# Patient Record
Sex: Female | Born: 1945 | Race: Black or African American | Hispanic: No | Smoking: Former smoker
Health system: Southern US, Community
[De-identification: ages and names within clinical notes are randomized; demographics above are authoritative.]

## PROBLEM LIST (undated history)

## (undated) DIAGNOSIS — R011 Cardiac murmur, unspecified: Secondary | ICD-10-CM

## (undated) DIAGNOSIS — I10 Essential (primary) hypertension: Secondary | ICD-10-CM

## (undated) DIAGNOSIS — M5030 Other cervical disc degeneration, unspecified cervical region: Secondary | ICD-10-CM

## (undated) DIAGNOSIS — T7840XA Allergy, unspecified, initial encounter: Secondary | ICD-10-CM

## (undated) DIAGNOSIS — M779 Enthesopathy, unspecified: Secondary | ICD-10-CM

## (undated) DIAGNOSIS — J302 Other seasonal allergic rhinitis: Secondary | ICD-10-CM

## (undated) DIAGNOSIS — K529 Noninfective gastroenteritis and colitis, unspecified: Secondary | ICD-10-CM

## (undated) DIAGNOSIS — K219 Gastro-esophageal reflux disease without esophagitis: Secondary | ICD-10-CM

## (undated) DIAGNOSIS — E785 Hyperlipidemia, unspecified: Secondary | ICD-10-CM

## (undated) DIAGNOSIS — K5792 Diverticulitis of intestine, part unspecified, without perforation or abscess without bleeding: Secondary | ICD-10-CM

## (undated) HISTORY — PX: TUBAL LIGATION: SHX77

## (undated) HISTORY — DX: Other cervical disc degeneration, unspecified cervical region: M50.30

## (undated) HISTORY — DX: Noninfective gastroenteritis and colitis, unspecified: K52.9

## (undated) HISTORY — DX: Allergy, unspecified, initial encounter: T78.40XA

## (undated) HISTORY — DX: Essential (primary) hypertension: I10

## (undated) HISTORY — DX: Gastro-esophageal reflux disease without esophagitis: K21.9

## (undated) HISTORY — PX: VAGINAL HYSTERECTOMY: SUR661

## (undated) HISTORY — DX: Hyperlipidemia, unspecified: E78.5

---

## 2002-01-21 HISTORY — PX: BREAST BIOPSY: SHX20

## 2003-07-14 ENCOUNTER — Other Ambulatory Visit: Payer: Self-pay

## 2004-04-13 ENCOUNTER — Ambulatory Visit: Payer: Self-pay

## 2004-04-18 ENCOUNTER — Ambulatory Visit: Payer: Self-pay

## 2004-04-20 ENCOUNTER — Ambulatory Visit: Payer: Self-pay | Admitting: Obstetrics and Gynecology

## 2005-09-09 ENCOUNTER — Emergency Department: Payer: Self-pay | Admitting: Emergency Medicine

## 2006-10-06 ENCOUNTER — Emergency Department: Payer: Self-pay | Admitting: Emergency Medicine

## 2007-05-22 ENCOUNTER — Emergency Department: Payer: Self-pay | Admitting: Emergency Medicine

## 2007-11-03 ENCOUNTER — Inpatient Hospital Stay: Payer: Self-pay | Admitting: *Deleted

## 2007-12-28 ENCOUNTER — Ambulatory Visit: Payer: Self-pay | Admitting: Internal Medicine

## 2008-01-06 ENCOUNTER — Ambulatory Visit: Payer: Self-pay | Admitting: Family Medicine

## 2008-01-13 ENCOUNTER — Ambulatory Visit: Payer: Self-pay | Admitting: Internal Medicine

## 2009-02-27 ENCOUNTER — Ambulatory Visit: Payer: Self-pay | Admitting: Internal Medicine

## 2010-03-01 ENCOUNTER — Ambulatory Visit: Payer: Self-pay | Admitting: Internal Medicine

## 2011-04-11 ENCOUNTER — Ambulatory Visit: Payer: Self-pay

## 2012-09-16 ENCOUNTER — Ambulatory Visit: Payer: Self-pay

## 2013-06-28 ENCOUNTER — Ambulatory Visit: Payer: Self-pay | Admitting: Physician Assistant

## 2013-12-03 ENCOUNTER — Ambulatory Visit: Payer: Self-pay | Admitting: Physician Assistant

## 2013-12-05 ENCOUNTER — Emergency Department: Payer: Self-pay | Admitting: Emergency Medicine

## 2013-12-05 LAB — TROPONIN I: Troponin-I: <0.02 ng/mL

## 2013-12-18 ENCOUNTER — Ambulatory Visit: Payer: Self-pay | Admitting: Family Medicine

## 2014-05-03 ENCOUNTER — Ambulatory Visit
Admit: 2014-05-03 | Disposition: A | Discharge status: Home / Self Care | Payer: Self-pay | Attending: Family Medicine | Admitting: Family Medicine

## 2014-07-04 ENCOUNTER — Other Ambulatory Visit: Payer: Self-pay

## 2014-07-04 DIAGNOSIS — Z0000 Encounter for general adult medical examination without abnormal findings: Secondary | ICD-10-CM | POA: Insufficient documentation

## 2014-07-04 DIAGNOSIS — K219 Gastro-esophageal reflux disease without esophagitis: Secondary | ICD-10-CM | POA: Insufficient documentation

## 2014-07-04 DIAGNOSIS — E785 Hyperlipidemia, unspecified: Secondary | ICD-10-CM | POA: Insufficient documentation

## 2014-07-04 DIAGNOSIS — M5410 Radiculopathy, site unspecified: Secondary | ICD-10-CM | POA: Insufficient documentation

## 2014-07-04 DIAGNOSIS — I10 Essential (primary) hypertension: Secondary | ICD-10-CM | POA: Insufficient documentation

## 2014-07-05 ENCOUNTER — Encounter: Payer: Self-pay | Admitting: Family Medicine

## 2014-07-05 ENCOUNTER — Ambulatory Visit (INDEPENDENT_AMBULATORY_CARE_PROVIDER_SITE_OTHER): Payer: BLUE CROSS/BLUE SHIELD | Admitting: Family Medicine

## 2014-07-05 VITALS — BP 130/80 | HR 64 | Ht 61.0 in | Wt 146.0 lb

## 2014-07-05 DIAGNOSIS — K219 Gastro-esophageal reflux disease without esophagitis: Secondary | ICD-10-CM | POA: Diagnosis not present

## 2014-07-05 DIAGNOSIS — I10 Essential (primary) hypertension: Secondary | ICD-10-CM

## 2014-07-05 DIAGNOSIS — E785 Hyperlipidemia, unspecified: Secondary | ICD-10-CM

## 2014-07-05 DIAGNOSIS — Z23 Encounter for immunization: Secondary | ICD-10-CM

## 2014-07-05 MED ORDER — HYDROCHLOROTHIAZIDE 25 MG PO TABS: 25.0000 mg | ORAL_TABLET | Freq: Every day | ORAL | Status: DC

## 2014-07-05 MED ORDER — OMEPRAZOLE 40 MG PO CPDR: 40.0000 mg | DELAYED_RELEASE_CAPSULE | Freq: Every day | ORAL | Status: DC

## 2014-07-05 NOTE — Progress Notes (Signed)
Name: Emily Boyer   MRN: [id number]    DOB: 14-Oct-1945   Date:07/05/2014       Progress Note  Subjective  Chief Complaint  Chief Complaint  Patient presents with  . Hypertension  . Gastrophageal Reflux    Hypertension This is a recurrent problem. The current episode started more than 1 year ago. The problem has been resolved since onset. Associated symptoms include neck pain. Pertinent negatives include no anxiety, blurred vision, chest pain, headaches, malaise/fatigue, orthopnea, palpitations, peripheral edema, PND or shortness of breath. There are no associated agents to hypertension. There are no known risk factors for coronary artery disease. Past treatments include diuretics. The current treatment provides moderate improvement. There are no compliance problems.  There is no history of angina, kidney disease, CAD/MI, CVA, heart failure, left ventricular hypertrophy, PVD or retinopathy. There is no history of chronic renal disease.  Gastrophageal Reflux She reports no abdominal pain, no belching, no chest pain, no coughing, no dysphagia, no heartburn, no nausea, no sore throat or no wheezing. This is a chronic problem. The current episode started more than 1 year ago. The problem occurs rarely. The problem has been unchanged. The symptoms are aggravated by certain foods. Pertinent negatives include no melena or weight loss. She has tried a PPI for the symptoms. The treatment provided moderate relief.  Hyperlipidemia This is a chronic problem. The current episode started more than 1 year ago. The problem is controlled. Recent lipid tests were reviewed and are normal. Exacerbating diseases include obesity. She has no history of chronic renal disease or diabetes. There are no known factors aggravating her hyperlipidemia. Pertinent negatives include no chest pain, focal weakness, myalgias or shortness of breath. Current antihyperlipidemic treatment includes diet change.    No problem-specific  assessment & plan notes found for this encounter.   Past Medical History  Diagnosis Date  . Hypertension   . GERD (gastroesophageal reflux disease)     Past Surgical History  Procedure Laterality Date  . Vaginal hysterectomy    . Tubal ligation    . Breast lumpectomy      History reviewed. No pertinent family history.  History   Social History  . Marital Status: Married    Spouse Name: N/A  . Number of Children: N/A  . Years of Education: N/A   Occupational History  . Not on file.   Social History Main Topics  . Smoking status: Former Research scientist (life sciences)  . Smokeless tobacco: Not on file  . Alcohol Use: 0.0 oz/week    0 Standard drinks or equivalent per week  . Drug Use: No  . Sexual Activity: Yes    Birth Control/ Protection: None   Other Topics Concern  . Not on file   Social History Narrative  . No narrative on file    Allergies  Allergen Reactions  . Methocarbamol   . Penicillins   . Tramadol Hcl      Review of Systems  Constitutional: Negative for fever, chills, weight loss and malaise/fatigue.  HENT: Negative for ear discharge, ear pain and sore throat.   Eyes: Negative for blurred vision.  Respiratory: Negative for cough, sputum production, shortness of breath and wheezing.   Cardiovascular: Negative for chest pain, palpitations, orthopnea, leg swelling and PND.  Gastrointestinal: Negative for heartburn, dysphagia, nausea, abdominal pain, diarrhea, constipation, blood in stool and melena.  Genitourinary: Negative for dysuria, urgency, frequency and hematuria.  Musculoskeletal: Positive for neck pain. Negative for myalgias, back pain and joint pain.  Hx of "spurs"  Skin: Negative for rash.  Neurological: Negative for dizziness, tingling, sensory change, focal weakness and headaches.  Endo/Heme/Allergies: Negative for environmental allergies and polydipsia. Does not bruise/bleed easily.  Psychiatric/Behavioral: Negative for depression and suicidal ideas.  The patient is not nervous/anxious and does not have insomnia.      Objective  Filed Vitals:   07/05/14 1039  BP: 130/80  Pulse: 64  Height: 5\' 1"  (1.549 m)  Weight: 146 lb (66.225 kg)    Physical Exam  Constitutional: She is well-developed, well-nourished, and in no distress. No distress.  HENT:  Head: Normocephalic and atraumatic.  Right Ear: External ear normal.  Left Ear: External ear normal.  Nose: Nose normal.  Mouth/Throat: Oropharynx is clear and moist.  Eyes: Conjunctivae and EOM are normal. Pupils are equal, round, and reactive to light. Right eye exhibits no discharge. Left eye exhibits no discharge.  Neck: Normal range of motion. Neck supple. No JVD present. No thyromegaly present.  Cardiovascular: Normal rate, regular rhythm, normal heart sounds and intact distal pulses.  Exam reveals no gallop and no friction rub.   No murmur heard. Pulmonary/Chest: Effort normal and breath sounds normal.  Abdominal: Soft. Bowel sounds are normal. She exhibits no mass. There is no tenderness. There is no guarding.  Musculoskeletal: Normal range of motion. She exhibits no edema.  Lymphadenopathy:    She has no cervical adenopathy.  Neurological: She is alert. She has normal reflexes.  Skin: Skin is warm and dry. She is not diaphoretic.  Psychiatric: Mood and affect normal.      No results found for this or any previous visit (from the past 2160 hour(s)).   Assessment & Plan  Problem List Items Addressed This Visit      Cardiovascular and Mediastinum   Hypertension - Primary   Relevant Medications   hydrochlorothiazide (HYDRODIURIL) 25 MG tablet   Other Relevant Orders   Renal Function Panel     Digestive   Gastroesophageal reflux disease   Relevant Medications   omeprazole (PRILOSEC) 40 MG capsule     Other   HLD (hyperlipidemia)   Relevant Medications   hydrochlorothiazide (HYDRODIURIL) 25 MG tablet   Other Relevant Orders   Lipid Profile    Other Visit  Diagnoses    Need for pneumococcal vaccination        Relevant Orders    Pneumococcal polysaccharide vaccine 23-valent greater than or equal to 2yo subcutaneous/IM         Dr. Candia Kingsbury [facility]  07/05/2014

## 2014-07-06 LAB — RENAL FUNCTION PANEL
Albumin: 4.1 g/dL (ref 3.6–4.8)
BUN/Creatinine Ratio: 18 (ref 11–26)
BUN: 13 mg/dL (ref 8–27)
CALCIUM: 9.5 mg/dL (ref 8.7–10.3)
CO2: 26 mmol/L (ref 18–29)
CREATININE: 0.74 mg/dL (ref 0.57–1.00)
Chloride: 102 mmol/L (ref 97–108)
GFR calc Af Amer: 97 mL/min/{1.73_m2} (ref >59)
GFR calc non Af Amer: 84 mL/min/{1.73_m2} (ref >59)
GLUCOSE: 89 mg/dL (ref 65–99)
Phosphorus: 3.3 mg/dL (ref 2.5–4.5)
Potassium: 4.2 mmol/L (ref 3.5–5.2)
SODIUM: 143 mmol/L (ref 134–144)

## 2014-07-06 LAB — LIPID PANEL
CHOL/HDL RATIO: 4.9 ratio — AB (ref 0.0–4.4)
Cholesterol, Total: 199 mg/dL (ref 100–199)
HDL: 41 mg/dL (ref >39)
LDL Calculated: 114 mg/dL — ABNORMAL HIGH (ref 0–99)
Triglycerides: 218 mg/dL — ABNORMAL HIGH (ref 0–149)
VLDL CHOLESTEROL CAL: 44 mg/dL — AB (ref 5–40)

## 2014-08-10 ENCOUNTER — Other Ambulatory Visit: Payer: Self-pay

## 2014-08-10 DIAGNOSIS — K219 Gastro-esophageal reflux disease without esophagitis: Secondary | ICD-10-CM

## 2014-08-10 NOTE — Telephone Encounter (Signed)
Patient would like 90 daily supply sent to mail order pharmacy.

## 2014-08-11 MED ORDER — OMEPRAZOLE 40 MG PO CPDR: 40.0000 mg | DELAYED_RELEASE_CAPSULE | Freq: Every day | ORAL | Status: DC

## 2014-10-28 ENCOUNTER — Other Ambulatory Visit: Payer: Self-pay | Admitting: Family Medicine

## 2014-10-31 ENCOUNTER — Other Ambulatory Visit: Payer: Self-pay

## 2014-12-07 ENCOUNTER — Emergency Department
Admission: EM | Admit: 2014-12-07 | Discharge: 2014-12-07 | Disposition: A | Discharge status: Home / Self Care | Payer: BLUE CROSS/BLUE SHIELD | Attending: Emergency Medicine | Admitting: Emergency Medicine

## 2014-12-07 ENCOUNTER — Emergency Department: Payer: BLUE CROSS/BLUE SHIELD

## 2014-12-07 ENCOUNTER — Ambulatory Visit
Admission: EM | Admit: 2014-12-07 | Discharge: 2014-12-07 | Disposition: A | Discharge status: Home / Self Care | Payer: BLUE CROSS/BLUE SHIELD | Attending: Family Medicine | Admitting: Family Medicine

## 2014-12-07 ENCOUNTER — Encounter: Payer: Self-pay | Admitting: Emergency Medicine

## 2014-12-07 DIAGNOSIS — K529 Noninfective gastroenteritis and colitis, unspecified: Secondary | ICD-10-CM

## 2014-12-07 DIAGNOSIS — Z880 Allergy status to penicillin: Secondary | ICD-10-CM | POA: Insufficient documentation

## 2014-12-07 DIAGNOSIS — Z87891 Personal history of nicotine dependence: Secondary | ICD-10-CM | POA: Insufficient documentation

## 2014-12-07 DIAGNOSIS — Z79899 Other long term (current) drug therapy: Secondary | ICD-10-CM | POA: Diagnosis not present

## 2014-12-07 DIAGNOSIS — R109 Unspecified abdominal pain: Secondary | ICD-10-CM | POA: Diagnosis present

## 2014-12-07 DIAGNOSIS — R1032 Left lower quadrant pain: Secondary | ICD-10-CM

## 2014-12-07 DIAGNOSIS — I10 Essential (primary) hypertension: Secondary | ICD-10-CM | POA: Diagnosis not present

## 2014-12-07 DIAGNOSIS — R112 Nausea with vomiting, unspecified: Secondary | ICD-10-CM | POA: Insufficient documentation

## 2014-12-07 HISTORY — DX: Diverticulitis of intestine, part unspecified, without perforation or abscess without bleeding: K57.92

## 2014-12-07 LAB — URINALYSIS COMPLETE WITH MICROSCOPIC (ARMC ONLY)
BILIRUBIN URINE: NEGATIVE
Glucose, UA: NEGATIVE mg/dL
HGB URINE DIPSTICK: NEGATIVE
KETONES UR: NEGATIVE mg/dL
Leukocytes, UA: NEGATIVE
NITRITE: NEGATIVE
PROTEIN: NEGATIVE mg/dL
pH: 6 (ref 5.0–8.0)

## 2014-12-07 LAB — CBC WITH DIFFERENTIAL/PLATELET
BASOS PCT: 1 %
Basophils Absolute: 0.1 10*3/uL (ref 0–0.1)
EOS ABS: 0 10*3/uL (ref 0–0.7)
EOS PCT: 1 %
HEMATOCRIT: 40.8 % (ref 35.0–47.0)
Hemoglobin: 14.1 g/dL (ref 12.0–16.0)
Lymphocytes Relative: 21 %
Lymphs Abs: 1.8 10*3/uL (ref 1.0–3.6)
MCH: 29.8 pg (ref 26.0–34.0)
MCHC: 34.5 g/dL (ref 32.0–36.0)
MCV: 86.2 fL (ref 80.0–100.0)
MONO ABS: 0.5 10*3/uL (ref 0.2–0.9)
MONOS PCT: 6 %
NEUTROS ABS: 6.2 10*3/uL (ref 1.4–6.5)
Neutrophils Relative %: 71 %
PLATELETS: 250 10*3/uL (ref 150–440)
RBC: 4.73 MIL/uL (ref 3.80–5.20)
RDW: 13.6 % (ref 11.5–14.5)
WBC: 8.6 10*3/uL (ref 3.6–11.0)

## 2014-12-07 LAB — COMPREHENSIVE METABOLIC PANEL
ALT: 27 U/L (ref 14–54)
AST: 35 U/L (ref 15–41)
Albumin: 4.4 g/dL (ref 3.5–5.0)
Alkaline Phosphatase: 71 U/L (ref 38–126)
Anion gap: 9 (ref 5–15)
BUN: 15 mg/dL (ref 6–20)
CHLORIDE: 102 mmol/L (ref 101–111)
CO2: 26 mmol/L (ref 22–32)
Calcium: 9.6 mg/dL (ref 8.9–10.3)
Creatinine, Ser: 0.71 mg/dL (ref 0.44–1.00)
GFR calc Af Amer: >60 mL/min (ref >60)
Glucose, Bld: 106 mg/dL — ABNORMAL HIGH (ref 65–99)
POTASSIUM: 3.6 mmol/L (ref 3.5–5.1)
SODIUM: 137 mmol/L (ref 135–145)
Total Bilirubin: 0.5 mg/dL (ref 0.3–1.2)
Total Protein: 8.1 g/dL (ref 6.5–8.1)

## 2014-12-07 MED ORDER — DICYCLOMINE HCL 10 MG/ML IM SOLN: 20.0000 mg | Freq: Once | INTRAMUSCULAR | Status: DC

## 2014-12-07 MED ORDER — IOHEXOL 300 MG/ML  SOLN
75.0000 mL | Freq: Once | INTRAMUSCULAR | Status: AC | PRN
  Administered 2014-12-07: 75 mL via INTRAVENOUS

## 2014-12-07 MED ORDER — IOHEXOL 240 MG/ML SOLN
25.0000 mL | Freq: Once | INTRAMUSCULAR | Status: AC | PRN
  Administered 2014-12-07: 25 mL via ORAL

## 2014-12-07 MED ORDER — ONDANSETRON 4 MG PO TBDP: 4.0000 mg | ORAL_TABLET | Freq: Three times a day (TID) | ORAL | Status: DC | PRN

## 2014-12-07 MED ORDER — METRONIDAZOLE 500 MG PO TABS: 500.0000 mg | ORAL_TABLET | Freq: Three times a day (TID) | ORAL | Status: AC

## 2014-12-07 MED ORDER — CIPROFLOXACIN HCL 500 MG PO TABS: 500.0000 mg | ORAL_TABLET | Freq: Two times a day (BID) | ORAL | Status: AC

## 2014-12-07 MED ORDER — DICYCLOMINE HCL 20 MG PO TABS: 20.0000 mg | ORAL_TABLET | Freq: Three times a day (TID) | ORAL | Status: DC | PRN

## 2014-12-07 NOTE — ED Provider Notes (Addendum)
CSN: [id number]     Arrival date & time 12/07/14  1131 History   First MD Initiated Contact with Patient 12/07/14 1329     Chief Complaint  Patient presents with  . Diarrhea   (Consider location/radiation/quality/duration/timing/severity/associated sxs/prior Treatment) HPI Comments: 69 yo female presents with a h/o sudden onset of severe abdominal pain (8-9/10) mainly on the left lower abdomen, that started around 3am this morning. Patient also states has vomited about 4 times and has had diarrhea about 10 times, several episodes with blood in the stool. Patient has a h/o colitis (of unknown etiology) and diverticulosis.   The history is provided by the patient.    Past Medical History  Diagnosis Date  . Hypertension   . GERD (gastroesophageal reflux disease)    Past Surgical History  Procedure Laterality Date  . Vaginal hysterectomy    . Tubal ligation    . Breast lumpectomy     No family history on file. Social History  Substance Use Topics  . Smoking status: Former Research scientist (life sciences)  . Smokeless tobacco: None  . Alcohol Use: 0.0 oz/week    0 Standard drinks or equivalent per week   OB History    No data available     Review of Systems  Allergies  Methocarbamol; Penicillins; and Tramadol hcl  Home Medications   Prior to Admission medications   Medication Sig Start Date End Date Taking? Authorizing Provider  hydrochlorothiazide (HYDRODIURIL) 25 MG tablet TAKE 1 TABLET DAILY 10/28/14  Yes Juline Patch, MD  omeprazole (PRILOSEC) 40 MG capsule Take 1 capsule (40 mg total) by mouth daily. 08/11/14  Yes Juline Patch, MD   Meds Ordered and Administered this Visit  Medications - No data to display  BP 185/90 mmHg  Pulse 65  Temp(Src) 98.1 F (36.7 C) (Oral)  Resp 18  Ht 5\' 1"  (1.549 m)  Wt 143 lb (64.864 kg)  BMI 27.03 kg/m2  SpO2 98% No data found.   Physical Exam  Constitutional: She appears well-developed and well-nourished. No distress.  Neck: Neck supple.   Cardiovascular: Normal rate, regular rhythm and normal heart sounds.   Pulmonary/Chest: Effort normal and breath sounds normal. No respiratory distress.  Abdominal: Soft. Bowel sounds are normal. She exhibits no distension and no mass. There is tenderness (diffusely over the lower abdomen but worse/more localized in the left lower quadrant; no rebound  or guarding). There is no rebound and no guarding.  Neurological: She is alert.  Skin: No rash noted. She is not diaphoretic.  Nursing note and vitals reviewed.   ED Course  Procedures (including critical care time)  Labs Review Labs Reviewed  URINE CULTURE  CBC WITH DIFFERENTIAL/PLATELET  URINALYSIS COMPLETEWITH MICROSCOPIC ([place])  COMPREHENSIVE METABOLIC PANEL    Imaging Review No results found.   Visual Acuity Review  Right Eye Distance:   Left Eye Distance:   Bilateral Distance:    Right Eye Near:   Left Eye Near:    Bilateral Near:         MDM   1. Left lower quadrant pain   (severe)   1. Discussed possible etiologies with patient including possible diverticulitis, abscess, colitis (?infectious) and recommendation to check blood tests and urine, but also recommend patient go to ED for further evaluation/management (possible CT scan of abdomen); patient in stable condition will proceed to [facility] ED by private vehicle (sister driving); notified triage RN at [facility] ED  Norval Gable, MD 12/07/14 [place], MD 12/07/14  1434 

## 2014-12-07 NOTE — ED Notes (Signed)
NAD noted at this time. Pt refused wheelchair to the lobby at this time. Pt denies comments/concerns at this time.

## 2014-12-07 NOTE — ED Notes (Signed)
Pt sent from [facility] urgent care with c/o LLQ pain since last night with bloody diarrhea with a hx of diverticulitis.Marland Kitchen

## 2014-12-07 NOTE — ED Notes (Signed)
Patient states she started having diarrhea, nausea and vomiting started in this morning.

## 2014-12-07 NOTE — ED Notes (Signed)
Pt reports she received medicine from nurse for nausea and feels better now. Izora Gala RN has left for the day and cannot confirm, but would have been ODT zofran. 4 or 8 mg.

## 2014-12-07 NOTE — ED Provider Notes (Signed)
[facility] Emergency Department Provider Note    ____________________________________________  Time seen: 1815  I have reviewed the triage vital signs and the nursing notes.   HISTORY  Chief Complaint Abdominal Pain   History limited by: Not Limited   HPI Emily Boyer is a 69 y.o. female who presents to the emergency department today because of concerns for abdominal pain, diarrhea and nausea. The patient was sent over from urgent care. The patient states that the symptoms started at roughly 5:00 this morning. They started suddenly. They have been somewhat intermittent. Patient states she had multiple nonbloody episodes of emesis. She did have roughly 10 episodes of diarrhea. They were at times bloody. She described red blood. She stated however that her last bowel movement was nonbloody. She states that her pain is located primarily left side however somewhat diffuse. She states she has a history of colitis roughly 10 years ago. No measured fever however the patient did have some chills.   Past Medical History  Diagnosis Date  . Hypertension   . GERD (gastroesophageal reflux disease)   . Diverticulitis     Patient Active Problem List   Diagnosis Date Noted  . Encounter for general adult medical examination without abnormal findings 07/04/2014  . Nerve root pain 07/04/2014  . Gastroesophageal reflux disease 07/04/2014  . HLD (hyperlipidemia) 07/04/2014  . Hypertension 07/04/2014    Past Surgical History  Procedure Laterality Date  . Vaginal hysterectomy    . Tubal ligation    . Breast lumpectomy      Current Outpatient Rx  Name  Route  Sig  Dispense  Refill  . hydrochlorothiazide (HYDRODIURIL) 25 MG tablet      TAKE 1 TABLET DAILY   90 tablet   0   . omeprazole (PRILOSEC) 40 MG capsule   Oral   Take 1 capsule (40 mg total) by mouth daily.   90 capsule   1     Allergies Methocarbamol; Penicillins; and Tramadol hcl  No family  history on file.  Social History Social History  Substance Use Topics  . Smoking status: Former Research scientist (life sciences)  . Smokeless tobacco: None  . Alcohol Use: 0.0 oz/week    0 Standard drinks or equivalent per week    Review of Systems  Constitutional: Negative for fever. Cardiovascular: Negative for chest pain. Respiratory: Negative for shortness of breath. Gastrointestinal: Positive for abdominal pain, diarrhea and vomiting. Genitourinary: Negative for dysuria. Musculoskeletal: Negative for back pain. Skin: Negative for rash. Neurological: Negative for headaches, focal weakness or numbness.  10-point ROS otherwise negative.  ____________________________________________   PHYSICAL EXAM:  VITAL SIGNS: ED Triage Vitals  Enc Vitals Group     BP 12/07/14 1500 162/81 mmHg     Pulse Rate 12/07/14 1500 62     Resp 12/07/14 1500 18     Temp 12/07/14 1500 98.3 F (36.8 C)     Temp Source 12/07/14 1500 Oral     SpO2 12/07/14 1500 96 %     Weight 12/07/14 1500 143 lb (64.864 kg)     Height 12/07/14 1500 5\' 1"  (1.549 m)     Head Cir --      Peak Flow --      Pain Score 12/07/14 1500 8   Constitutional: Alert and oriented. Well appearing and in no distress. Eyes: Conjunctivae are normal. PERRL. Normal extraocular movements. ENT   Head: Normocephalic and atraumatic.   Nose: No congestion/rhinnorhea.   Mouth/Throat: Mucous membranes are moist.  Neck: No stridor. Hematological/Lymphatic/Immunilogical: No cervical lymphadenopathy. Cardiovascular: Normal rate, regular rhythm.  No murmurs, rubs, or gallops. Respiratory: Normal respiratory effort without tachypnea nor retractions. Breath sounds are clear and equal bilaterally. No wheezes/rales/rhonchi. Gastrointestinal: Soft, tender to palpation somewhat diffusely however does appear to be worse in the left side. No rebound or guarding. Genitourinary: Deferred Musculoskeletal: Normal range of motion in all extremities. No joint  effusions.   Neurologic:  Normal speech and language. No gross focal neurologic deficits are appreciated.  Skin:  Skin is warm, dry and intact. No rash noted. Psychiatric: Mood and affect are normal. Speech and behavior are normal. Patient exhibits appropriate insight and judgment.  ____________________________________________    LABS (pertinent positives/negatives)  Labs from urgent care reviewed  ____________________________________________   EKG  None  ____________________________________________    RADIOLOGY  CT abdomen and pelvis  IMPRESSION: Diffuse colonic wall thickening likely related to colitis. No other focal abnormality is seen. ____________________________________________   PROCEDURES  Procedure(s) performed: None  Critical Care performed: No  ____________________________________________   INITIAL IMPRESSION / ASSESSMENT AND PLAN / ED COURSE  Pertinent labs & imaging results that were available during my care of the patient were reviewed by me and considered in my medical decision making (see chart for details).  Patient presented to the emergency department today because of concerns for abdominal pain sent from urgent care. A CT scan was done which shows colitis. No perforation or abscess. Patient's abdomen is soft and certainly nonsurgical at this point. Will discharge home with pain medication and antiemetics and antibiotics.  ____________________________________________   FINAL CLINICAL IMPRESSION(S) / ED DIAGNOSES  Final diagnoses:  Colitis     Nance Pear, MD 12/07/14 1902

## 2014-12-07 NOTE — ED Notes (Signed)
Pt refused medication. States that she is ready to go and does not want to wait for IM injection.

## 2014-12-07 NOTE — Discharge Instructions (Signed)
Abdominal Pain, Adult Many things can cause abdominal pain. Usually, abdominal pain is not caused by a disease and will improve without treatment. It can often be observed and treated at home. Your health care provider will do a physical exam and possibly order blood tests and X-rays to help determine the seriousness of your pain. However, in many cases, more time must pass before a clear cause of the pain can be found. Before that point, your health care provider may not know if you need more testing or further treatment. HOME CARE INSTRUCTIONS Monitor your abdominal pain for any changes. The following actions may help to alleviate any discomfort you are experiencing:  Only take over-the-counter or prescription medicines as directed by your health care provider.  Do not take laxatives unless directed to do so by your health care provider.  Try a clear liquid diet (broth, tea, or water) as directed by your health care provider. Slowly move to a bland diet as tolerated. SEEK MEDICAL CARE IF:  You have unexplained abdominal pain.  You have abdominal pain associated with nausea or diarrhea.  You have pain when you urinate or have a bowel movement.  You experience abdominal pain that wakes you in the night.  You have abdominal pain that is worsened or improved by eating food.  You have abdominal pain that is worsened with eating fatty foods.  You have a fever. SEEK IMMEDIATE MEDICAL CARE IF:  Your pain does not go away within 2 hours.  You keep throwing up (vomiting).  Your pain is felt only in portions of the abdomen, such as the right side or the left lower portion of the abdomen.  You pass bloody or black tarry stools. MAKE SURE YOU:  Understand these instructions.  Will watch your condition.  Will get help right away if you are not doing well or get worse.   This information is not intended to replace advice given to you by your health care provider. Make sure you discuss  any questions you have with your health care provider.   Document Released: 10/17/2004 Document Revised: 09/28/2014 Document Reviewed: 09/16/2012 Elsevier Interactive Patient Education 2016 [place]

## 2014-12-07 NOTE — Discharge Instructions (Signed)
Please seek medical attention for any high fevers, chest pain, shortness of breath, change in behavior, persistent vomiting, bloody stool or any other new or concerning symptoms.   Colitis Colitis is inflammation of the colon. Colitis may last a short time (acute) or it may last a long time (chronic). CAUSES This condition may be caused by:  Viruses.  Bacteria.  Reactions to medicine.  Certain autoimmune diseases, such as Crohn disease or ulcerative colitis. SYMPTOMS Symptoms of this condition include:  Diarrhea.  Passing bloody or tarry stool.  Pain.  Fever.  Vomiting.  Tiredness (fatigue).  Weight loss.  Bloating.  Sudden increase in abdominal pain.  Having fewer bowel movements than usual. DIAGNOSIS This condition is diagnosed with a stool test or a blood test. You may also have other tests, including X-rays, a CT scan, or a colonoscopy. TREATMENT Treatment may include:  Resting the bowel. This involves not eating or drinking for a period of time.  Fluids that are given through an IV tube.  Medicine for pain and diarrhea.  Antibiotic medicines.  Cortisone medicines.  Surgery. HOME CARE INSTRUCTIONS Eating and Drinking  Follow instructions from your health care provider about eating or drinking restrictions.  Drink enough fluid to keep your urine clear or pale yellow.  Work with a dietitian to determine which foods cause your condition to flare up.  Avoid foods that cause flare-ups.  Eat a well-balanced diet. Medicines  Take over-the-counter and prescription medicines only as told by your health care provider.  If you were prescribed an antibiotic medicine, take it as told by your health care provider. Do not stop taking the antibiotic even if you start to feel better. General Instructions  Keep all follow-up visits as told by your health care provider. This is important. SEEK MEDICAL CARE IF:  Your symptoms do not go away.  You develop  new symptoms. SEEK IMMEDIATE MEDICAL CARE IF:  You have a fever that does not go away with treatment.  You develop chills.  You have extreme weakness, fainting, or dehydration.  You have repeated vomiting.  You develop severe pain in your abdomen.  You pass bloody or tarry stool.   This information is not intended to replace advice given to you by your health care provider. Make sure you discuss any questions you have with your health care provider.   Document Released: 02/15/2004 Document Revised: 09/28/2014 Document Reviewed: 05/02/2014 Elsevier Interactive Patient Education [employer].

## 2014-12-09 ENCOUNTER — Encounter: Payer: Self-pay | Admitting: Family Medicine

## 2014-12-09 ENCOUNTER — Ambulatory Visit (INDEPENDENT_AMBULATORY_CARE_PROVIDER_SITE_OTHER): Payer: BLUE CROSS/BLUE SHIELD | Admitting: Family Medicine

## 2014-12-09 VITALS — BP 124/78 | HR 70 | Ht 61.0 in | Wt 144.0 lb

## 2014-12-09 DIAGNOSIS — K529 Noninfective gastroenteritis and colitis, unspecified: Secondary | ICD-10-CM | POA: Diagnosis not present

## 2014-12-09 LAB — URINE CULTURE: SPECIAL REQUESTS: NORMAL

## 2014-12-09 NOTE — Progress Notes (Signed)
Name: Emily Boyer   MRN: [id number]    DOB: Jul 07, 1945   Date:12/09/2014       Progress Note  Subjective  Chief Complaint  Chief Complaint  Patient presents with  . Follow-up    ER visit- Dx colitis- started antibiotics and Bentyl    Abdominal Pain This is a new problem. The current episode started in the past 7 days. The onset quality is sudden. The problem occurs intermittently. The problem has been gradually improving. The pain is located in the suprapubic region. The pain is at a severity of 8/10 (present 2/10). The pain is moderate. The quality of the pain is colicky. Associated symptoms include diarrhea, hematochezia, nausea and vomiting. Pertinent negatives include no anorexia, arthralgias, belching, constipation, dysuria, fever, frequency, headaches, hematuria, melena, myalgias or weight loss. Relieved by: antibiotics. She has tried antibiotics (bentyl) for the symptoms. The treatment provided moderate relief.    No problem-specific assessment & plan notes found for this encounter.   Past Medical History  Diagnosis Date  . Hypertension   . GERD (gastroesophageal reflux disease)   . Diverticulitis   . Colitis     Past Surgical History  Procedure Laterality Date  . Vaginal hysterectomy    . Tubal ligation    . Breast lumpectomy      History reviewed. No pertinent family history.  Social History   Social History  . Marital Status: Married    Spouse Name: N/A  . Number of Children: N/A  . Years of Education: N/A   Occupational History  . Not on file.   Social History Main Topics  . Smoking status: Former Research scientist (life sciences)  . Smokeless tobacco: Not on file  . Alcohol Use: 0.0 oz/week    0 Standard drinks or equivalent per week  . Drug Use: No  . Sexual Activity: Yes    Birth Control/ Protection: None   Other Topics Concern  . Not on file   Social History Narrative    Allergies  Allergen Reactions  . Methocarbamol   . Penicillins   . Tramadol Hcl       Review of Systems  Constitutional: Negative for fever, chills, weight loss and malaise/fatigue.  HENT: Negative for ear discharge, ear pain and sore throat.   Eyes: Negative for blurred vision.  Respiratory: Negative for cough, sputum production, shortness of breath and wheezing.   Cardiovascular: Negative for chest pain, palpitations and leg swelling.  Gastrointestinal: Positive for nausea, vomiting, abdominal pain, diarrhea and hematochezia. Negative for heartburn, constipation, blood in stool, melena and anorexia.  Genitourinary: Negative for dysuria, urgency, frequency and hematuria.  Musculoskeletal: Negative for myalgias, back pain, joint pain, arthralgias and neck pain.  Skin: Negative for rash.  Neurological: Negative for dizziness, tingling, sensory change, focal weakness and headaches.  Endo/Heme/Allergies: Negative for environmental allergies and polydipsia. Does not bruise/bleed easily.  Psychiatric/Behavioral: Negative for depression and suicidal ideas. The patient is not nervous/anxious and does not have insomnia.      Objective  Filed Vitals:   12/09/14 1113  BP: 124/78  Pulse: 70  Height: 5\' 1"  (1.549 m)  Weight: 144 lb (65.318 kg)    Physical Exam  Constitutional: She is well-developed, well-nourished, and in no distress. No distress.  HENT:  Head: Normocephalic and atraumatic.  Right Ear: External ear normal.  Left Ear: External ear normal.  Nose: Nose normal.  Mouth/Throat: Oropharynx is clear and moist.  Eyes: Conjunctivae and EOM are normal. Pupils are equal, round, and reactive to light.  Right eye exhibits no discharge. Left eye exhibits no discharge.  Neck: Normal range of motion. Neck supple. No JVD present. No thyromegaly present.  Cardiovascular: Normal rate, regular rhythm, normal heart sounds and intact distal pulses.  Exam reveals no gallop and no friction rub.   No murmur heard. Pulmonary/Chest: Effort normal and breath sounds normal.   Abdominal: Soft. Bowel sounds are normal. She exhibits no distension and no mass. There is tenderness. There is no rebound and no guarding.  Musculoskeletal: Normal range of motion. She exhibits no edema.  Lymphadenopathy:    She has no cervical adenopathy.  Neurological: She is alert. She has normal reflexes.  Skin: Skin is warm and dry. She is not diaphoretic.  Psychiatric: Mood and affect normal.  Nursing note and vitals reviewed.     Assessment & Plan  Problem List Items Addressed This Visit    None    Visit Diagnoses    Colitis    -  Primary    treated for diverticulitis    Relevant Orders    Ambulatory referral to Gastroenterology    CBC         Dr. Otilio Miu [facility]  12/09/2014

## 2014-12-10 LAB — CBC
HEMOGLOBIN: 13.1 g/dL (ref 11.1–15.9)
Hematocrit: 38.3 % (ref 34.0–46.6)
MCH: 29.4 pg (ref 26.6–33.0)
MCHC: 34.2 g/dL (ref 31.5–35.7)
MCV: 86 fL (ref 79–97)
PLATELETS: 287 10*3/uL (ref 150–379)
RBC: 4.45 x10E6/uL (ref 3.77–5.28)
RDW: 13.6 % (ref 12.3–15.4)
WBC: 6.7 10*3/uL (ref 3.4–10.8)

## 2014-12-12 ENCOUNTER — Other Ambulatory Visit: Payer: Self-pay

## 2015-01-06 ENCOUNTER — Other Ambulatory Visit: Payer: Self-pay | Admitting: Family Medicine

## 2015-01-18 ENCOUNTER — Other Ambulatory Visit: Payer: Self-pay

## 2015-01-19 ENCOUNTER — Encounter: Payer: Self-pay | Admitting: Gastroenterology

## 2015-01-19 ENCOUNTER — Ambulatory Visit (INDEPENDENT_AMBULATORY_CARE_PROVIDER_SITE_OTHER): Payer: BLUE CROSS/BLUE SHIELD | Admitting: Gastroenterology

## 2015-01-19 ENCOUNTER — Other Ambulatory Visit: Payer: Self-pay

## 2015-01-19 VITALS — BP 162/80 | HR 60 | Temp 98.0°F | Ht 61.0 in | Wt 143.0 lb

## 2015-01-19 DIAGNOSIS — R933 Abnormal findings on diagnostic imaging of other parts of digestive tract: Secondary | ICD-10-CM | POA: Diagnosis not present

## 2015-01-19 DIAGNOSIS — K529 Noninfective gastroenteritis and colitis, unspecified: Secondary | ICD-10-CM | POA: Diagnosis not present

## 2015-01-19 NOTE — Progress Notes (Signed)
Gastroenterology Consultation  Referring Provider:     Juline Patch, MD Primary Care Physician:  Otilio Miu, MD Primary Gastroenterologist:  Dr. Allen Norris     Reason for Consultation:     Colitis        HPI:   Emily Boyer is a 69 y.o. y/o female referred for consultation & management of colitis by Dr. Otilio Miu, MD.  This patient comes in today with a report of being emergency room and having a CT scan showing diffuse colitis. The patient reports that her last attack of this was back in 2013 but she had an episode all all the way back as far as 2006. The patient reports that she has severe abdominal pain with rectal bleeding when she has these attacks. There is no report of any nausea vomiting with her episodes. The patient was treated with antibiotics and reports that her symptoms have resolved. She also states that she was told back in 2006 to avoid seeds and not although there is no sign of diverticulosis reported on the CT scan that was recently done. The patient also reports that she has not had a colonoscopy since 2006.   Past Medical History  Diagnosis Date  . Hypertension   . GERD (gastroesophageal reflux disease)   . Diverticulitis   . Colitis     Past Surgical History  Procedure Laterality Date  . Vaginal hysterectomy    . Tubal ligation    . Breast lumpectomy      Prior to Admission medications   Medication Sig Start Date End Date Taking? Authorizing Provider  aspirin EC 81 MG tablet Take by mouth.   Yes Historical Provider, MD  dicyclomine (BENTYL) 20 MG tablet Take 1 tablet (20 mg total) by mouth 3 (three) times daily as needed for spasms. 12/07/14 12/07/15 Yes Nance Pear, MD  hydrochlorothiazide (HYDRODIURIL) 25 MG tablet TAKE 1 TABLET DAILY 01/06/15  Yes Juline Patch, MD  meloxicam (MOBIC) 15 MG tablet Take by mouth.   Yes Historical Provider, MD  omeprazole (PRILOSEC) 40 MG capsule Take 1 capsule (40 mg total) by mouth daily. 08/11/14  Yes Juline Patch, MD  ondansetron (ZOFRAN ODT) 4 MG disintegrating tablet Take 1 tablet (4 mg total) by mouth every 8 (eight) hours as needed for nausea or vomiting. 12/07/14  Yes Nance Pear, MD  cyclobenzaprine (FLEXERIL) 10 MG tablet Take by mouth. Reported on 01/19/2015    Historical Provider, MD    Family History  Problem Relation Age of Onset  . Stroke Mother   . Arthritis Father      Social History  Substance Use Topics  . Smoking status: Former Research scientist (life sciences)  . Smokeless tobacco: Never Used  . Alcohol Use: 0.0 oz/week    0 Standard drinks or equivalent per week    Allergies as of 01/19/2015 - Review Complete 12/09/2014  Allergen Reaction Noted  . Methocarbamol  07/05/2014  . Penicillins  07/04/2014  . Tramadol hcl  07/04/2014    Review of Systems:    All systems reviewed and negative except where noted in HPI.   Physical Exam:  BP 162/80 mmHg  Pulse 60  Temp(Src) 98 F (36.7 C) (Oral)  Ht 5\' 1"  (1.549 m)  Wt 143 lb (64.864 kg)  BMI 27.03 kg/m2 No LMP recorded. Patient is postmenopausal. Psych:  Alert and cooperative. Normal mood and affect. General:   Alert,  Well-developed, well-nourished, pleasant and cooperative in NAD Head:  Normocephalic and atraumatic. Eyes:  Sclera clear, no icterus.   Conjunctiva pink. Ears:  Normal auditory acuity. Nose:  No deformity, discharge, or lesions. Mouth:  No deformity or lesions,oropharynx pink & moist. Neck:  Supple; no masses or thyromegaly. Lungs:  Respirations even and unlabored.  Clear throughout to auscultation.   No wheezes, crackles, or rhonchi. No acute distress. Heart:  Regular rate and rhythm; no murmurs, clicks, rubs, or gallops. Abdomen:  Normal bowel sounds.  No bruits.  Soft, non-tender and non-distended without masses, hepatosplenomegaly or hernias noted.  No guarding or rebound tenderness.  Negative Carnett sign.   Rectal:  Deferred.  Msk:  Symmetrical without gross deformities.  Good, equal movement & strength  bilaterally. Pulses:  Normal pulses noted. Extremities:  No clubbing or edema.  No cyanosis. Neurologic:  Alert and oriented x3;  grossly normal neurologically. Skin:  Intact without significant lesions or rashes.  No jaundice. Lymph Nodes:  No significant cervical adenopathy. Psych:  Alert and cooperative. Normal mood and affect.  Imaging Studies: No results found.  Assessment and Plan:   [name] is a 69 y.o. y/o female who comes in today after being emergency room for severe abdominal pain with rectal bleeding and a CT scan showing diffuse colitis. The patient's etiology of colitis now and in the past is unknown. The patient is not sure why she has had these attacks in the past. The patient has not had a colonoscopy since 2006. She will be set up for colonoscopy due to the recent episode of colitis with a CT scan showing diffuse wall thickening throughout the colon. The patient has been explained the plan and agrees with it.I have discussed risks & benefits which include, but are not limited to, bleeding, infection, perforation & drug reaction.  The patient agrees with this plan & written consent will be obtained.      Note: This dictation was prepared with Dragon dictation along with smaller phrase technology. Any transcriptional errors that result from this process are unintentional.

## 2015-01-27 ENCOUNTER — Encounter: Payer: Self-pay | Admitting: *Deleted

## 2015-02-03 NOTE — Discharge Instructions (Signed)

## 2015-02-06 ENCOUNTER — Other Ambulatory Visit: Payer: Self-pay | Admitting: Gastroenterology

## 2015-02-06 ENCOUNTER — Encounter
Admission: RE | Disposition: A | Discharge status: Home / Self Care | Payer: Self-pay | Source: Ambulatory Visit | Attending: Gastroenterology

## 2015-02-06 ENCOUNTER — Ambulatory Visit
Admission: RE | Admit: 2015-02-06 | Discharge: 2015-02-06 | Disposition: A | Discharge status: Home / Self Care | Payer: BLUE CROSS/BLUE SHIELD | Source: Ambulatory Visit | Attending: Gastroenterology | Admitting: Gastroenterology

## 2015-02-06 ENCOUNTER — Ambulatory Visit: Payer: BLUE CROSS/BLUE SHIELD | Admitting: Student in an Organized Health Care Education/Training Program

## 2015-02-06 DIAGNOSIS — Z79899 Other long term (current) drug therapy: Secondary | ICD-10-CM | POA: Insufficient documentation

## 2015-02-06 DIAGNOSIS — Z888 Allergy status to other drugs, medicaments and biological substances status: Secondary | ICD-10-CM | POA: Insufficient documentation

## 2015-02-06 DIAGNOSIS — R933 Abnormal findings on diagnostic imaging of other parts of digestive tract: Secondary | ICD-10-CM

## 2015-02-06 DIAGNOSIS — Z823 Family history of stroke: Secondary | ICD-10-CM | POA: Diagnosis not present

## 2015-02-06 DIAGNOSIS — I10 Essential (primary) hypertension: Secondary | ICD-10-CM | POA: Insufficient documentation

## 2015-02-06 DIAGNOSIS — Z8261 Family history of arthritis: Secondary | ICD-10-CM | POA: Diagnosis not present

## 2015-02-06 DIAGNOSIS — M778 Other enthesopathies, not elsewhere classified: Secondary | ICD-10-CM | POA: Insufficient documentation

## 2015-02-06 DIAGNOSIS — K219 Gastro-esophageal reflux disease without esophagitis: Secondary | ICD-10-CM | POA: Insufficient documentation

## 2015-02-06 DIAGNOSIS — Z880 Allergy status to penicillin: Secondary | ICD-10-CM | POA: Diagnosis not present

## 2015-02-06 DIAGNOSIS — Z7982 Long term (current) use of aspirin: Secondary | ICD-10-CM | POA: Insufficient documentation

## 2015-02-06 DIAGNOSIS — Z87891 Personal history of nicotine dependence: Secondary | ICD-10-CM | POA: Insufficient documentation

## 2015-02-06 DIAGNOSIS — K5730 Diverticulosis of large intestine without perforation or abscess without bleeding: Secondary | ICD-10-CM | POA: Insufficient documentation

## 2015-02-06 DIAGNOSIS — R011 Cardiac murmur, unspecified: Secondary | ICD-10-CM | POA: Insufficient documentation

## 2015-02-06 DIAGNOSIS — R198 Other specified symptoms and signs involving the digestive system and abdomen: Secondary | ICD-10-CM | POA: Insufficient documentation

## 2015-02-06 DIAGNOSIS — Z90710 Acquired absence of both cervix and uterus: Secondary | ICD-10-CM | POA: Insufficient documentation

## 2015-02-06 HISTORY — PX: COLONOSCOPY WITH PROPOFOL: SHX5780

## 2015-02-06 HISTORY — DX: Cardiac murmur, unspecified: R01.1

## 2015-02-06 HISTORY — DX: Enthesopathy, unspecified: M77.9

## 2015-02-06 SURGERY — COLONOSCOPY WITH PROPOFOL
Anesthesia: Monitor Anesthesia Care | Wound class: Contaminated

## 2015-02-06 MED ORDER — SIMETHICONE 40 MG/0.6ML PO SUSP
ORAL | Status: DC | PRN
  Administered 2015-02-06: 11:00:00

## 2015-02-06 MED ORDER — SODIUM CHLORIDE 0.9 % IV SOLN: INTRAVENOUS | Status: DC

## 2015-02-06 MED ORDER — LIDOCAINE HCL (CARDIAC) 20 MG/ML IV SOLN
INTRAVENOUS | Status: DC | PRN
  Administered 2015-02-06: 50 mg via INTRAVENOUS

## 2015-02-06 MED ORDER — PROPOFOL 10 MG/ML IV BOLUS
INTRAVENOUS | Status: DC | PRN
  Administered 2015-02-06: 30 mg via INTRAVENOUS
  Administered 2015-02-06: 60 mg via INTRAVENOUS
  Administered 2015-02-06: 20 mg via INTRAVENOUS
  Administered 2015-02-06 (×2): 30 mg via INTRAVENOUS

## 2015-02-06 MED ORDER — LACTATED RINGERS IV SOLN
INTRAVENOUS | Status: DC
  Administered 2015-02-06: 11:00:00 via INTRAVENOUS

## 2015-02-06 SURGICAL SUPPLY — 28 items
CANISTER SUCT 1200ML W/VALVE (MISCELLANEOUS) ×2 IMPLANT
FCP ESCP3.2XJMB 240X2.8X (MISCELLANEOUS)
FORCEPS BIOP RAD 4 LRG CAP 4 (CUTTING FORCEPS) ×2 IMPLANT
FORCEPS BIOP RJ4 240 W/NDL (MISCELLANEOUS)
FORCEPS ESCP3.2XJMB 240X2.8X (MISCELLANEOUS) IMPLANT
GOWN CVR UNV OPN BCK APRN NK (MISCELLANEOUS) ×2 IMPLANT
GOWN ISOL THUMB LOOP REG UNIV (MISCELLANEOUS) ×2
HEMOCLIP INSTINCT (CLIP) IMPLANT
INJECTOR VARIJECT VIN23 (MISCELLANEOUS) IMPLANT
KIT CO2 TUBING (TUBING) IMPLANT
KIT DEFENDO VALVE AND CONN (KITS) IMPLANT
KIT ENDO PROCEDURE OLY (KITS) ×2 IMPLANT
LIGATOR MULTIBAND 6SHOOTER MBL (MISCELLANEOUS) IMPLANT
MARKER SPOT ENDO TATTOO 5ML (MISCELLANEOUS) IMPLANT
PAD GROUND ADULT SPLIT (MISCELLANEOUS) IMPLANT
SNARE SHORT THROW 13M SML OVAL (MISCELLANEOUS) IMPLANT
SNARE SHORT THROW 30M LRG OVAL (MISCELLANEOUS) IMPLANT
SPOT EX ENDOSCOPIC TATTOO (MISCELLANEOUS)
SUCTION POLY TRAP 4CHAMBER (MISCELLANEOUS) IMPLANT
TRAP SUCTION POLY (MISCELLANEOUS) IMPLANT
TUBING CONN 6MMX3.1M (TUBING)
TUBING SUCTION CONN 0.25 STRL (TUBING) IMPLANT
UNDERPAD 30X60 958B10 (PK) (MISCELLANEOUS) IMPLANT
VALVE BIOPSY ENDO (VALVE) IMPLANT
VARIJECT INJECTOR VIN23 (MISCELLANEOUS)
WATER AUXILLARY (MISCELLANEOUS) IMPLANT
WATER STERILE IRR 250ML POUR (IV SOLUTION) ×2 IMPLANT
WATER STERILE IRR 500ML POUR (IV SOLUTION) IMPLANT

## 2015-02-06 NOTE — Anesthesia Procedure Notes (Signed)
Procedure Name: MAC Performed by: Arrietty Dercole Pre-anesthesia Checklist: Patient identified, Emergency Drugs available, Suction available, Timeout performed and Patient being monitored Patient Re-evaluated:Patient Re-evaluated prior to inductionOxygen Delivery Method: Nasal cannula Placement Confirmation: positive ETCO2       

## 2015-02-06 NOTE — Transfer of Care (Signed)
Immediate Anesthesia Transfer of Care Note  Patient: Emily Boyer  Procedure(s) Performed: Procedure(s): COLONOSCOPY WITH PROPOFOL (N/A)  Patient Location: PACU  Anesthesia Type: MAC  Level of Consciousness: awake, alert  and patient cooperative  Airway and Oxygen Therapy: Patient Spontanous Breathing and Patient connected to supplemental oxygen  Post-op Assessment: Post-op Vital signs reviewed, Patient's Cardiovascular Status Stable, Respiratory Function Stable, Patent Airway and No signs of Nausea or vomiting  Post-op Vital Signs: Reviewed and stable  Complications: No apparent anesthesia complications

## 2015-02-06 NOTE — Anesthesia Postprocedure Evaluation (Signed)
Anesthesia Post Note  Patient: Emily Boyer  Procedure(s) Performed: Procedure(s) (LRB): COLONOSCOPY WITH PROPOFOL (N/A)  Patient location during evaluation: PACU Anesthesia Type: MAC Level of consciousness: awake and alert Pain management: pain level controlled Vital Signs Assessment: post-procedure vital signs reviewed and stable Respiratory status: spontaneous breathing, nonlabored ventilation, respiratory function stable and patient connected to nasal cannula oxygen Cardiovascular status: stable and blood pressure returned to baseline Postop Assessment: no signs of nausea or vomiting Anesthetic complications: no    Gohan Collister

## 2015-02-06 NOTE — H&P (Signed)
[facility]  [address], [place], [place] [address] Phone: [phone] Fax : [phone]  Primary Care Physician:  Otilio Miu, MD Primary Gastroenterologist:  Dr. Allen Norris  Pre-Procedure History & Physical: HPI:  Emily Boyer is a 70 y.o. female is here for an colonoscopy.   Past Medical History  Diagnosis Date  . Hypertension   . GERD (gastroesophageal reflux disease)   . Diverticulitis   . Colitis   . Heart murmur     followed by PCP  . Bone spur     right side of neck    Past Surgical History  Procedure Laterality Date  . Vaginal hysterectomy    . Tubal ligation    . Breast lumpectomy      Prior to Admission medications   Medication Sig Start Date End Date Taking? Authorizing Provider  aspirin EC 81 MG tablet Take by mouth.   Yes Historical Provider, MD  hydrochlorothiazide (HYDRODIURIL) 25 MG tablet TAKE 1 TABLET DAILY 01/06/15  Yes Juline Patch, MD  Multiple Vitamin (MULTIVITAMIN) tablet Take 1 tablet by mouth daily.   Yes Historical Provider, MD  Omega-3 Fatty Acids (FISH OIL PO) Take by mouth daily.   Yes Historical Provider, MD  omeprazole (PRILOSEC) 40 MG capsule Take 1 capsule (40 mg total) by mouth daily. 08/11/14  Yes Juline Patch, MD  cyclobenzaprine (FLEXERIL) 10 MG tablet Take by mouth. Reported on 02/06/2015    Historical Provider, MD  dicyclomine (BENTYL) 20 MG tablet Take 1 tablet (20 mg total) by mouth 3 (three) times daily as needed for spasms. Patient not taking: Reported on 02/06/2015 12/07/14 12/07/15  Nance Pear, MD  meloxicam (MOBIC) 15 MG tablet Take by mouth.    Historical Provider, MD  ondansetron (ZOFRAN ODT) 4 MG disintegrating tablet Take 1 tablet (4 mg total) by mouth every 8 (eight) hours as needed for nausea or vomiting. Patient not taking: Reported on 02/06/2015 12/07/14   Nance Pear, MD    Allergies as of 01/19/2015 - Review Complete 12/09/2014  Allergen Reaction Noted  . Methocarbamol   07/05/2014  . Penicillins  07/04/2014  . Tramadol hcl  07/04/2014    Family History  Problem Relation Age of Onset  . Stroke Mother   . Arthritis Father     Social History   Social History  . Marital Status: Married    Spouse Name: N/A  . Number of Children: N/A  . Years of Education: N/A   Occupational History  . Not on file.   Social History Main Topics  . Smoking status: Former Research scientist (life sciences)  . Smokeless tobacco: Never Used     Comment: quit 2014  . Alcohol Use: 1.2 oz/week    2 Glasses of wine, 0 Standard drinks or equivalent per week  . Drug Use: No  . Sexual Activity: Yes    Birth Control/ Protection: None   Other Topics Concern  . Not on file   Social History Narrative    Review of Systems: See HPI, otherwise negative ROS  Physical Exam: BP 124/88 mmHg  Temp(Src) 97.3 F (36.3 C) (Temporal)  Resp 16  Ht 5\' 1"  (1.549 m)  Wt 137 lb (62.143 kg)  BMI 25.90 kg/m2  SpO2 99% General:   Alert,  pleasant and cooperative in NAD Head:  Normocephalic and atraumatic. Neck:  Supple; no masses or thyromegaly. Lungs:  Clear throughout to auscultation.    Heart:  Regular rate and rhythm. Abdomen:  Soft, nontender and nondistended. Normal  bowel sounds, without guarding, and without rebound.   Neurologic:  Alert and  oriented x4;  grossly normal neurologically.  Impression/Plan: Emily Boyer is here for an colonoscopy to be performed for colitis  Risks, benefits, limitations, and alternatives regarding  colonoscopy have been reviewed with the patient.  Questions have been answered.  All parties agreeable.   Ollen Bowl, MD  02/06/2015, 10:51 AM

## 2015-02-06 NOTE — Anesthesia Preprocedure Evaluation (Signed)
Anesthesia Evaluation  Patient identified by MRN, date of birth, ID band  Reviewed: NPO status   History of Anesthesia Complications Negative for: history of anesthetic complications  Airway Mallampati: II  TM Distance: >3 FB Neck ROM: full    Dental  (+) Missing,    Pulmonary neg pulmonary ROS, former smoker,    Pulmonary exam normal        Cardiovascular Exercise Tolerance: Good hypertension, Normal cardiovascular exam+ Valvular Problems/Murmurs (benighn murmur)      Neuro/Psych negative neurological ROS  negative psych ROS   GI/Hepatic Neg liver ROS, GERD  Medicated,  Endo/Other  negative endocrine ROS  Renal/GU negative Renal ROS  negative genitourinary   Musculoskeletal   Abdominal   Peds  Hematology negative hematology ROS (+)   Anesthesia Other Findings   Reproductive/Obstetrics                             Anesthesia Physical Anesthesia Plan  ASA: II  Anesthesia Plan: MAC   Post-op Pain Management:    Induction:   Airway Management Planned:   Additional Equipment:   Intra-op Plan:   Post-operative Plan:   Informed Consent: I have reviewed the patients History and Physical, chart, labs and discussed the procedure including the risks, benefits and alternatives for the proposed anesthesia with the patient or authorized representative who has indicated his/her understanding and acceptance.     Plan Discussed with: CRNA  Anesthesia Plan Comments:         Anesthesia Quick Evaluation

## 2015-02-06 NOTE — Op Note (Signed)
[facility] Patient Name: Emily Boyer Procedure Date: 02/06/2015 11:10 AM MRN: [id number] Account #: [id number] Date of Birth: [birth year] Admit Type: Outpatient Age: 70 Room: [facility] OR ROOM 01 Gender: Female Note Status: Finalized Procedure:         Colonoscopy Indications:       Abnormal CT of the GI tract Providers:         [name], MD Referring MD:      Juline Patch, MD (Referring MD) Medicines:         Propofol per Anesthesia Complications:     No immediate complications. Procedure:         Pre-Anesthesia Assessment:                    - Prior to the procedure, a History and Physical was                     performed, and patient medications and allergies were                     reviewed. The patient's tolerance of previous anesthesia                     was also reviewed. The risks and benefits of the procedure                     and the sedation options and risks were discussed with the                     patient. All questions were answered, and informed consent                     was obtained. Prior Anticoagulants: The patient has taken                     no previous anticoagulant or antiplatelet agents. ASA                     Grade Assessment: II - A patient with mild systemic                     disease. After reviewing the risks and benefits, the                     patient was deemed in satisfactory condition to undergo                     the procedure.                    After obtaining informed consent, the colonoscope was                     passed under direct vision. Throughout the procedure, the                     patient's blood pressure, pulse, and oxygen saturations                     were monitored continuously. The was introduced through                     the anus and advanced to the the cecum, identified by  appendiceal orifice and ileocecal valve. The colonoscopy   was performed without difficulty. The patient tolerated                     the procedure well. The quality of the bowel preparation                     was excellent. Findings:      The perianal and digital rectal examinations were normal.      Multiple small-mouthed diverticula were found in the sigmoid colon.      Random biopsies were obtained with cold forceps for histology randomly       in the entire colon. Impression:        - Diverticulosis in the sigmoid colon.                    - Random biopsies were obtained in the entire colon. Recommendation:    - Await pathology results. Procedure Code(s): --- Professional ---                    [phone], Colonoscopy, flexible; with biopsy, single or                     multiple Diagnosis Code(s): --- Professional ---                    R93.3, Abnormal findings on diagnostic imaging of other                     parts of digestive tract CPT copyright 2014 American Medical Association. All rights reserved. The codes documented in this report are preliminary and upon coder review may  be revised to meet current compliance requirements. [name], MD 02/06/2015 11:30:09 AM This report has been signed electronically. Number of Addenda: 0 Note Initiated On: 02/06/2015 11:10 AM Scope Withdrawal Time: 0 hours 6 minutes 8 seconds  Total Procedure Duration: 0 hours 11 minutes 5 seconds       [facility]

## 2015-02-07 ENCOUNTER — Encounter: Payer: Self-pay | Admitting: Gastroenterology

## 2015-02-09 ENCOUNTER — Encounter: Payer: Self-pay | Admitting: Gastroenterology

## 2015-02-10 ENCOUNTER — Telehealth: Payer: Self-pay | Admitting: Gastroenterology

## 2015-02-10 NOTE — Telephone Encounter (Signed)
Patient called and mentioned she hasn't had a Bowel Movement since her colonoscopy 02/06/2015, She asked should she take a stool softner or something to make her go. I told patient if wanted she could try something over the counter and  Let us know in a few hours what has happen, per  if it was a success she want call back but if not she will touch base early afternoon. Please if patient doesn't call back call her Monday morning to check on her

## 2015-02-13 NOTE — Telephone Encounter (Signed)
Phoned patient Monday to check on her, She did have a BM but per patient she said her rectum is still alittle irritated, Spoke with nurse ginger and she said patient could get some Desitin cream and try that.Told patient to call us if she has any questions or concerns.

## 2015-03-19 ENCOUNTER — Ambulatory Visit
Admission: EM | Admit: 2015-03-19 | Discharge: 2015-03-19 | Disposition: A | Discharge status: Home / Self Care | Payer: BLUE CROSS/BLUE SHIELD | Attending: Family Medicine | Admitting: Family Medicine

## 2015-03-19 DIAGNOSIS — J0100 Acute maxillary sinusitis, unspecified: Secondary | ICD-10-CM | POA: Diagnosis not present

## 2015-03-19 MED ORDER — AZITHROMYCIN 250 MG PO TABS: ORAL_TABLET | ORAL | Status: DC

## 2015-03-19 NOTE — ED Notes (Signed)
Patient c/o nasal congestion, headache, sneezing, slight cough, and burning nasal passages which started on Wednesday.  Denies fever/c/n/v or chest pain.

## 2015-03-19 NOTE — ED Provider Notes (Signed)
CSN: [id number]     Arrival date & time 03/19/15  [id number] History   First MD Initiated Contact with Patient 03/19/15 [phone]     Chief Complaint  Patient presents with  . Nasal Congestion   (Consider location/radiation/quality/duration/timing/severity/associated sxs/prior Treatment) Patient is a 70 y.o. female presenting with URI. The history is provided by the patient.  URI Presenting symptoms: congestion, cough, facial pain and fatigue   Severity:  Moderate Onset quality:  Sudden Timing:  Constant Progression:  Worsening Chronicity:  New Relieved by:  Nothing Ineffective treatments:  OTC medications Associated symptoms: headaches and sinus pain   Risk factors: being elderly and sick contacts   Risk factors: no chronic cardiac disease, no chronic kidney disease, no chronic respiratory disease, no immunosuppression, no recent illness and no recent travel     Past Medical History  Diagnosis Date  . Hypertension   . GERD (gastroesophageal reflux disease)   . Diverticulitis   . Colitis   . Heart murmur     followed by PCP  . Bone spur     right side of neck   Past Surgical History  Procedure Laterality Date  . Vaginal hysterectomy    . Tubal ligation    . Breast lumpectomy    . Colonoscopy with propofol N/A 02/06/2015    Procedure: COLONOSCOPY WITH PROPOFOL;  Surgeon: [name], MD;  Location: [facility];  Service: Endoscopy;  Laterality: N/A;   Family History  Problem Relation Age of Onset  . Stroke Mother   . Arthritis Father    Social History  Substance Use Topics  . Smoking status: Former Research scientist (life sciences)  . Smokeless tobacco: Never Used     Comment: quit 2014  . Alcohol Use: 1.2 oz/week    2 Glasses of wine, 0 Standard drinks or equivalent per week   OB History    No data available     Review of Systems  Constitutional: Positive for fatigue.  HENT: Positive for congestion.   Respiratory: Positive for cough.   Neurological: Positive for headaches.     Allergies  Methocarbamol; Penicillins; and Tramadol hcl  Home Medications   Prior to Admission medications   Medication Sig Start Date End Date Taking? Authorizing Provider  aspirin EC 81 MG tablet Take by mouth.   Yes Historical Provider, MD  hydrochlorothiazide (HYDRODIURIL) 25 MG tablet TAKE 1 TABLET DAILY 01/06/15  Yes Juline Patch, MD  Multiple Vitamin (MULTIVITAMIN) tablet Take 1 tablet by mouth daily.   Yes Historical Provider, MD  Omega-3 Fatty Acids (FISH OIL PO) Take by mouth daily.   Yes Historical Provider, MD  omeprazole (PRILOSEC) 40 MG capsule Take 1 capsule (40 mg total) by mouth daily. 08/11/14  Yes Juline Patch, MD  azithromycin (ZITHROMAX Z-PAK) 250 MG tablet 2 tabs po once day 1, then 1 tab po qd for next 4 days 03/19/15   Norval Gable, MD  cyclobenzaprine (FLEXERIL) 10 MG tablet Take by mouth. Reported on 02/06/2015    Historical Provider, MD  dicyclomine (BENTYL) 20 MG tablet Take 1 tablet (20 mg total) by mouth 3 (three) times daily as needed for spasms. Patient not taking: Reported on 02/06/2015 12/07/14 12/07/15  Nance Pear, MD  meloxicam (MOBIC) 15 MG tablet Take by mouth.    Historical Provider, MD  ondansetron (ZOFRAN ODT) 4 MG disintegrating tablet Take 1 tablet (4 mg total) by mouth every 8 (eight) hours as needed for nausea or vomiting. Patient not taking: Reported on 02/06/2015 12/07/14  Nance Pear, MD   Meds Ordered and Administered this Visit  Medications - No data to display  BP 139/77 mmHg  Pulse 70  Temp(Src) 98.9 F (37.2 C) (Oral)  Resp 16  Ht 5\' 1"  (1.549 m)  Wt 143 lb (64.864 kg)  BMI 27.03 kg/m2  SpO2 98% No data found.   Physical Exam  Constitutional: She appears well-developed and well-nourished. No distress.  HENT:  Head: Normocephalic and atraumatic.  Right Ear: Tympanic membrane, external ear and ear canal normal.  Left Ear: Tympanic membrane, external ear and ear canal normal.  Nose: Mucosal edema and rhinorrhea  present. No nose lacerations, sinus tenderness, nasal deformity, septal deviation or nasal septal hematoma. No epistaxis.  No foreign bodies. Right sinus exhibits maxillary sinus tenderness and frontal sinus tenderness. Left sinus exhibits maxillary sinus tenderness and frontal sinus tenderness.  Mouth/Throat: Uvula is midline and mucous membranes are normal. Posterior oropharyngeal erythema present. No oropharyngeal exudate, posterior oropharyngeal edema or tonsillar abscesses.  Eyes: Conjunctivae and EOM are normal. Pupils are equal, round, and reactive to light. Right eye exhibits no discharge. Left eye exhibits no discharge. No scleral icterus.  Neck: Normal range of motion. Neck supple. No thyromegaly present.  Cardiovascular: Normal rate, regular rhythm and normal heart sounds.   Pulmonary/Chest: Effort normal and breath sounds normal. No respiratory distress. She has no wheezes. She has no rales.  Lymphadenopathy:    She has no cervical adenopathy.  Skin: She is not diaphoretic.  Nursing note and vitals reviewed.   ED Course  Procedures (including critical care time)  Labs Review Labs Reviewed - No data to display  Imaging Review No results found.   Visual Acuity Review  Right Eye Distance:   Left Eye Distance:   Bilateral Distance:    Right Eye Near:   Left Eye Near:    Bilateral Near:         MDM   1. Acute maxillary sinusitis, recurrence not specified    Discharge Medication List as of 03/19/2015 10:04 AM    START taking these medications   Details  azithromycin (ZITHROMAX Z-PAK) 250 MG tablet 2 tabs po once day 1, then 1 tab po qd for next 4 days, Normal       1. diagnosis reviewed with patient 2. rx as per orders above; reviewed possible side effects, interactions, risks and benefits  3. Recommend supportive treatment with otc flonase, otc analgesics   4. Follow-up prn if symptoms worsen or don't improve     Norval Gable, MD 03/19/15 1008

## 2015-04-04 ENCOUNTER — Other Ambulatory Visit: Payer: Self-pay | Admitting: Family Medicine

## 2015-04-21 ENCOUNTER — Other Ambulatory Visit: Payer: Self-pay | Admitting: Family Medicine

## 2015-05-01 ENCOUNTER — Other Ambulatory Visit: Payer: Self-pay

## 2015-07-04 ENCOUNTER — Other Ambulatory Visit: Payer: Self-pay | Admitting: Family Medicine

## 2015-07-17 ENCOUNTER — Ambulatory Visit (INDEPENDENT_AMBULATORY_CARE_PROVIDER_SITE_OTHER): Payer: Medicare Other | Admitting: Family Medicine

## 2015-07-17 ENCOUNTER — Encounter: Payer: Self-pay | Admitting: Family Medicine

## 2015-07-17 VITALS — BP 130/80 | HR 62 | Ht 61.0 in | Wt 143.0 lb

## 2015-07-17 DIAGNOSIS — Z1231 Encounter for screening mammogram for malignant neoplasm of breast: Secondary | ICD-10-CM | POA: Diagnosis not present

## 2015-07-17 DIAGNOSIS — E785 Hyperlipidemia, unspecified: Secondary | ICD-10-CM

## 2015-07-17 DIAGNOSIS — I10 Essential (primary) hypertension: Secondary | ICD-10-CM | POA: Diagnosis not present

## 2015-07-17 DIAGNOSIS — Z1239 Encounter for other screening for malignant neoplasm of breast: Secondary | ICD-10-CM

## 2015-07-17 DIAGNOSIS — Z0000 Encounter for general adult medical examination without abnormal findings: Secondary | ICD-10-CM | POA: Diagnosis not present

## 2015-07-17 DIAGNOSIS — S39012D Strain of muscle, fascia and tendon of lower back, subsequent encounter: Secondary | ICD-10-CM

## 2015-07-17 DIAGNOSIS — S338XXD Sprain of other parts of lumbar spine and pelvis, subsequent encounter: Secondary | ICD-10-CM | POA: Diagnosis not present

## 2015-07-17 DIAGNOSIS — K219 Gastro-esophageal reflux disease without esophagitis: Secondary | ICD-10-CM | POA: Diagnosis not present

## 2015-07-17 MED ORDER — RANITIDINE HCL 150 MG PO CAPS: 150.0000 mg | ORAL_CAPSULE | Freq: Every evening | ORAL | Status: DC

## 2015-07-17 MED ORDER — HYDROCHLOROTHIAZIDE 25 MG PO TABS: 25.0000 mg | ORAL_TABLET | Freq: Every day | ORAL | Status: DC

## 2015-07-17 NOTE — Progress Notes (Signed)
Name: Emily Boyer   MRN: [id number]    DOB: May 29, 1945   Date:07/17/2015       Progress Note  Subjective  Chief Complaint  Chief Complaint  Patient presents with  . Annual Exam    no pap needs mammo  . Hypertension  . Gastroesophageal Reflux  . Back Pain    after standing/ sitting for long periods of time- it hurts    Hypertension This is a chronic problem. The current episode started more than 1 year ago. The problem has been waxing and waning since onset. The problem is controlled. Pertinent negatives include no anxiety, blurred vision, chest pain, headaches, malaise/fatigue, neck pain, orthopnea, palpitations, peripheral edema, PND, shortness of breath or sweats. There are no associated agents to hypertension. There are no known risk factors for coronary artery disease. Past treatments include diuretics. The current treatment provides mild improvement. There are no compliance problems.  There is no history of angina, heart failure or PVD.  Gastroesophageal Reflux She reports no abdominal pain, no belching, no chest pain, no choking, no coughing, no dysphagia, no early satiety, no globus sensation, no heartburn, no hoarse voice, no nausea, no sore throat, no stridor, no tooth decay, no water brash or no wheezing. This is a recurrent problem. The current episode started more than 1 month ago. The problem occurs occasionally. The problem has been waxing and waning. The symptoms are aggravated by certain foods. Pertinent negatives include no anemia, fatigue, melena, muscle weakness, orthopnea or weight loss. She has tried a PPI for the symptoms. The treatment provided mild relief.  Back Pain This is a recurrent problem. The problem occurs daily. The problem has been waxing and waning since onset. The pain is present in the lumbar spine. The quality of the pain is described as aching. The pain does not radiate. The pain is mild. The symptoms are aggravated by bending and twisting. Pertinent  negatives include no abdominal pain, bladder incontinence, bowel incontinence, chest pain, dysuria, fever, headaches, numbness, paresis, paresthesias, perianal numbness, tingling or weight loss. She has tried muscle relaxant for the symptoms. The treatment provided mild relief.    No problem-specific assessment & plan notes found for this encounter.   Past Medical History  Diagnosis Date  . Hypertension   . GERD (gastroesophageal reflux disease)   . Diverticulitis   . Colitis   . Heart murmur     followed by PCP  . Bone spur     right side of neck    Past Surgical History  Procedure Laterality Date  . Vaginal hysterectomy    . Tubal ligation    . Breast lumpectomy    . Colonoscopy with propofol N/A 02/06/2015    Procedure: COLONOSCOPY WITH PROPOFOL;  Surgeon: [name], MD;  Location: [facility];  Service: Endoscopy;  Laterality: N/A;    Family History  Problem Relation Age of Onset  . Stroke Mother   . Arthritis Father     Social History   Social History  . Marital Status: Married    Spouse Name: N/A  . Number of Children: N/A  . Years of Education: N/A   Occupational History  . Not on file.   Social History Main Topics  . Smoking status: Former Research scientist (life sciences)  . Smokeless tobacco: Never Used     Comment: quit 2014  . Alcohol Use: 1.2 oz/week    2 Glasses of wine, 0 Standard drinks or equivalent per week  . Drug Use: No  .  Sexual Activity: Yes    Birth Control/ Protection: None   Other Topics Concern  . Not on file   Social History Narrative    Allergies  Allergen Reactions  . Methocarbamol Itching  . Penicillins Itching  . Tramadol Hcl Itching     Review of Systems  Constitutional: Negative for fever, chills, weight loss, malaise/fatigue and fatigue.  HENT: Negative for ear discharge, ear pain, hoarse voice and sore throat.   Eyes: Negative for blurred vision.  Respiratory: Negative for cough, sputum production, choking, shortness of breath  and wheezing.   Cardiovascular: Negative for chest pain, palpitations, orthopnea, leg swelling and PND.  Gastrointestinal: Negative for heartburn, dysphagia, nausea, abdominal pain, diarrhea, constipation, blood in stool, melena and bowel incontinence.  Genitourinary: Negative for bladder incontinence, dysuria, urgency, frequency and hematuria.  Musculoskeletal: Positive for back pain. Negative for myalgias, joint pain, muscle weakness and neck pain.  Skin: Negative for rash.  Neurological: Negative for dizziness, tingling, sensory change, focal weakness, numbness, headaches and paresthesias.  Endo/Heme/Allergies: Negative for environmental allergies and polydipsia. Does not bruise/bleed easily.  Psychiatric/Behavioral: Negative for depression and suicidal ideas. The patient is not nervous/anxious and does not have insomnia.      Objective  Filed Vitals:   07/17/15 1005  BP: 130/80  Pulse: 62  Height: 5\' 1"  (1.549 m)  Weight: 143 lb (64.864 kg)    Physical Exam  Constitutional: She is well-developed, well-nourished, and in no distress. No distress.  HENT:  Head: Normocephalic and atraumatic.  Right Ear: Tympanic membrane, external ear and ear canal normal.  Left Ear: Tympanic membrane, external ear and ear canal normal.  Nose: Nose normal.  Mouth/Throat: Uvula is midline, oropharynx is clear and moist and mucous membranes are normal.  Eyes: Conjunctivae and EOM are normal. Pupils are equal, round, and reactive to light. Right eye exhibits no discharge. Left eye exhibits no discharge.  Neck: Normal range of motion. Neck supple. No JVD present. No thyromegaly present.  Cardiovascular: Normal rate, regular rhythm, normal heart sounds and intact distal pulses.  Exam reveals no gallop and no friction rub.   No murmur heard. Pulmonary/Chest: Effort normal and breath sounds normal. Right breast exhibits no inverted nipple, no mass, no nipple discharge, no skin change and no tenderness.  Left breast exhibits no inverted nipple, no mass, no nipple discharge, no skin change and no tenderness. Breasts are symmetrical.  Abdominal: Soft. Bowel sounds are normal. She exhibits no mass. There is no tenderness. There is no guarding.  Musculoskeletal: Normal range of motion. She exhibits no edema.  Lymphadenopathy:    She has no cervical adenopathy.  Neurological: She is alert. She has normal reflexes.  Skin: Skin is warm and dry. She is not diaphoretic.  Psychiatric: Mood and affect normal.  Nursing note and vitals reviewed.     Assessment & Plan  Problem List Items Addressed This Visit      Cardiovascular and Mediastinum   Hypertension   Relevant Medications   hydrochlorothiazide (HYDRODIURIL) 25 MG tablet   Other Relevant Orders   Renal Function Panel     Digestive   Gastroesophageal reflux disease   Relevant Medications   ranitidine (ZANTAC) 150 MG capsule     Other   HLD (hyperlipidemia)   Relevant Medications   hydrochlorothiazide (HYDRODIURIL) 25 MG tablet   Other Relevant Orders   Lipid Profile    Other Visit Diagnoses    Annual physical exam    -  Primary    Lumbosacral  strain, subsequent encounter        tylenol prn    Breast cancer screening        Visit for screening mammogram        Relevant Orders    MM Digital Screening         Dr. Otilio Miu [facility]  07/17/2015

## 2015-07-18 LAB — RENAL FUNCTION PANEL
Albumin: 4.5 g/dL (ref 3.6–4.8)
BUN / CREAT RATIO: 16 (ref 12–28)
BUN: 12 mg/dL (ref 8–27)
CALCIUM: 9.8 mg/dL (ref 8.7–10.3)
CHLORIDE: 94 mmol/L — AB (ref 96–106)
CO2: 27 mmol/L (ref 18–29)
CREATININE: 0.73 mg/dL (ref 0.57–1.00)
GFR calc Af Amer: 98 mL/min/{1.73_m2} (ref >59)
GFR calc non Af Amer: 85 mL/min/{1.73_m2} (ref >59)
Glucose: 83 mg/dL (ref 65–99)
Phosphorus: 3.7 mg/dL (ref 2.5–4.5)
Potassium: 4.8 mmol/L (ref 3.5–5.2)
SODIUM: 140 mmol/L (ref 134–144)

## 2015-07-18 LAB — LIPID PANEL
CHOLESTEROL TOTAL: 220 mg/dL — AB (ref 100–199)
Chol/HDL Ratio: 5 ratio units — ABNORMAL HIGH (ref 0.0–4.4)
HDL: 44 mg/dL (ref >39)
LDL CALC: 134 mg/dL — AB (ref 0–99)
TRIGLYCERIDES: 212 mg/dL — AB (ref 0–149)
VLDL Cholesterol Cal: 42 mg/dL — ABNORMAL HIGH (ref 5–40)

## 2015-08-01 ENCOUNTER — Ambulatory Visit
Admission: RE | Admit: 2015-08-01 | Discharge: 2015-08-01 | Disposition: A | Discharge status: Home / Self Care | Payer: BLUE CROSS/BLUE SHIELD | Source: Ambulatory Visit | Attending: Family Medicine | Admitting: Family Medicine

## 2015-08-01 DIAGNOSIS — Z1231 Encounter for screening mammogram for malignant neoplasm of breast: Secondary | ICD-10-CM

## 2015-10-23 ENCOUNTER — Encounter: Payer: Self-pay | Admitting: Family Medicine

## 2015-10-23 ENCOUNTER — Ambulatory Visit (INDEPENDENT_AMBULATORY_CARE_PROVIDER_SITE_OTHER): Payer: BLUE CROSS/BLUE SHIELD | Admitting: Family Medicine

## 2015-10-23 VITALS — BP 120/78 | HR 98 | Temp 98.1°F | Ht 61.0 in | Wt 145.0 lb

## 2015-10-23 DIAGNOSIS — J40 Bronchitis, not specified as acute or chronic: Secondary | ICD-10-CM | POA: Diagnosis not present

## 2015-10-23 MED ORDER — AZITHROMYCIN 250 MG PO TABS: ORAL_TABLET | ORAL | 0 refills | Status: DC

## 2015-10-23 MED ORDER — BENZONATATE 100 MG PO CAPS: 100.0000 mg | ORAL_CAPSULE | Freq: Two times a day (BID) | ORAL | 0 refills | Status: DC | PRN

## 2015-10-23 NOTE — Progress Notes (Signed)
Name: Emily Boyer   MRN: [id number]    DOB: 1946-01-09   Date:10/23/2015       Progress Note  Subjective  Chief Complaint  Chief Complaint  Patient presents with  . Sinusitis    cough and cong, tickle in throat, coughing leads to vomitting phlegm/ beige colored    Sinusitis  This is a new problem. The current episode started in the past 7 days. The problem has been gradually improving since onset. The maximum temperature recorded prior to her arrival was 100.4 - 100.9 F. Associated symptoms include chills, congestion, coughing, diaphoresis, headaches, a hoarse voice, sinus pressure and sneezing. Pertinent negatives include no ear pain, neck pain, shortness of breath, sore throat or swollen glands. Past treatments include acetaminophen and oral decongestants. The treatment provided no relief.  Cough  This is a new problem. The current episode started in the past 7 days. The problem has been gradually worsening. The problem occurs every few minutes. The cough is productive of purulent sputum (gray). Associated symptoms include chills and headaches. Pertinent negatives include no chest pain, ear pain, fever, heartburn, myalgias, rash, sore throat, shortness of breath, weight loss or wheezing. She has tried nothing for the symptoms. The treatment provided mild relief. There is no history of asthma, COPD, emphysema, environmental allergies or pneumonia.    No problem-specific Assessment & Plan notes found for this encounter.   Past Medical History:  Diagnosis Date  . Bone spur    right side of neck  . Colitis   . Diverticulitis   . GERD (gastroesophageal reflux disease)   . Heart murmur    followed by PCP  . Hypertension     Past Surgical History:  Procedure Laterality Date  . BREAST BIOPSY Right    neg  . BREAST LUMPECTOMY    . COLONOSCOPY WITH PROPOFOL N/A 02/06/2015   Procedure: COLONOSCOPY WITH PROPOFOL;  Surgeon: [name], MD;  Location: [place] [facility];  Service:  Endoscopy;  Laterality: N/A;  . TUBAL LIGATION    . VAGINAL HYSTERECTOMY      Family History  Problem Relation Age of Onset  . Stroke Mother   . Arthritis Father   . Breast cancer Neg Hx     Social History   Social History  . Marital status: Married    Spouse name: N/A  . Number of children: N/A  . Years of education: N/A   Occupational History  . Not on file.   Social History Main Topics  . Smoking status: Former Research scientist (life sciences)  . Smokeless tobacco: Never Used     Comment: quit 2014  . Alcohol use 1.2 oz/week    2 Glasses of wine per week  . Drug use: No  . Sexual activity: Yes    Birth control/ protection: None   Other Topics Concern  . Not on file   Social History Narrative  . No narrative on file    Allergies  Allergen Reactions  . Methocarbamol Itching  . Penicillins Itching  . Tramadol Hcl Itching     Review of Systems  Constitutional: Positive for chills and diaphoresis. Negative for fever, malaise/fatigue and weight loss.  HENT: Positive for congestion, hoarse voice, sinus pressure and sneezing. Negative for ear discharge, ear pain and sore throat.   Eyes: Negative for blurred vision.  Respiratory: Positive for cough. Negative for sputum production, shortness of breath and wheezing.   Cardiovascular: Negative for chest pain, palpitations and leg swelling.  Gastrointestinal: Negative for abdominal pain,  blood in stool, constipation, diarrhea, heartburn, melena and nausea.  Genitourinary: Negative for dysuria, frequency, hematuria and urgency.  Musculoskeletal: Negative for back pain, joint pain, myalgias and neck pain.  Skin: Negative for rash.  Neurological: Positive for headaches. Negative for dizziness, tingling, sensory change and focal weakness.  Endo/Heme/Allergies: Negative for environmental allergies and polydipsia. Does not bruise/bleed easily.  Psychiatric/Behavioral: Negative for depression and suicidal ideas. The patient is not nervous/anxious  and does not have insomnia.      Objective  Vitals:   10/23/15 1154  BP: 120/78  Pulse: 98  Temp: 98.1 F (36.7 C)  TempSrc: Oral  Weight: 145 lb (65.8 kg)  Height: 5\' 1"  (1.549 m)    Physical Exam  Constitutional: She is well-developed, well-nourished, and in no distress. No distress.  HENT:  Head: Normocephalic and atraumatic.  Right Ear: External ear normal.  Left Ear: External ear normal.  Nose: Nose normal.  Mouth/Throat: Oropharynx is clear and moist.  Eyes: Conjunctivae and EOM are normal. Pupils are equal, round, and reactive to light. Right eye exhibits no discharge. Left eye exhibits no discharge.  Neck: Normal range of motion. Neck supple. No JVD present. No thyromegaly present.  Cardiovascular: Normal rate, regular rhythm, normal heart sounds and intact distal pulses.  Exam reveals no gallop and no friction rub.   No murmur heard. Pulmonary/Chest: Effort normal and breath sounds normal. She has no wheezes. She has no rales.  Abdominal: Soft. Bowel sounds are normal. She exhibits no mass. There is no tenderness. There is no guarding.  Musculoskeletal: Normal range of motion. She exhibits no edema.  Lymphadenopathy:    She has no cervical adenopathy.  Neurological: She is alert. She has normal reflexes.  Skin: Skin is warm and dry. She is not diaphoretic.  Psychiatric: Mood and affect normal.      Assessment & Plan  Problem List Items Addressed This Visit    None    Visit Diagnoses    Bronchitis    -  Primary   Relevant Medications   benzonatate (TESSALON) 100 MG capsule   azithromycin (ZITHROMAX) 250 MG tablet     N/A I spent 15 minutes with this patient, More than 50% of that time was spent in face to face education, counseling and care coordination.15 Dr. Macon [facility]  10/23/15

## 2015-11-06 ENCOUNTER — Other Ambulatory Visit: Payer: Self-pay

## 2015-11-06 DIAGNOSIS — J40 Bronchitis, not specified as acute or chronic: Secondary | ICD-10-CM

## 2015-11-06 MED ORDER — DOXYCYCLINE HYCLATE 100 MG PO TABS: 100.0000 mg | ORAL_TABLET | Freq: Two times a day (BID) | ORAL | 0 refills | Status: DC

## 2015-12-07 ENCOUNTER — Ambulatory Visit (INDEPENDENT_AMBULATORY_CARE_PROVIDER_SITE_OTHER): Payer: Worker's Compensation

## 2015-12-07 ENCOUNTER — Ambulatory Visit
Admission: EM | Admit: 2015-12-07 | Discharge: 2015-12-07 | Disposition: A | Discharge status: Home / Self Care | Payer: Worker's Compensation | Attending: Family Medicine | Admitting: Family Medicine

## 2015-12-07 ENCOUNTER — Ambulatory Visit (INDEPENDENT_AMBULATORY_CARE_PROVIDER_SITE_OTHER): Payer: Medicare HMO

## 2015-12-07 DIAGNOSIS — I10 Essential (primary) hypertension: Secondary | ICD-10-CM

## 2015-12-07 DIAGNOSIS — Z23 Encounter for immunization: Secondary | ICD-10-CM | POA: Diagnosis not present

## 2015-12-07 DIAGNOSIS — S6991XA Unspecified injury of right wrist, hand and finger(s), initial encounter: Secondary | ICD-10-CM | POA: Diagnosis not present

## 2015-12-07 MED ORDER — CEPHALEXIN 500 MG PO CAPS: 500.0000 mg | ORAL_CAPSULE | Freq: Three times a day (TID) | ORAL | 0 refills | Status: AC

## 2015-12-07 MED ORDER — CLONIDINE HCL 0.1 MG PO TABS
0.1000 mg | ORAL_TABLET | Freq: Once | ORAL | Status: AC
  Administered 2015-12-07: 0.1 mg via ORAL

## 2015-12-07 MED ORDER — TETANUS-DIPHTH-ACELL PERTUSSIS 5-2.5-18.5 LF-MCG/0.5 IM SUSP
0.5000 mL | Freq: Once | INTRAMUSCULAR | Status: AC
  Administered 2015-12-07: 0.5 mL via INTRAMUSCULAR

## 2015-12-07 MED ORDER — ACETAMINOPHEN 325 MG PO TABS
650.0000 mg | ORAL_TABLET | Freq: Once | ORAL | Status: AC
  Administered 2015-12-07: 650 mg via ORAL

## 2015-12-07 NOTE — Discharge Instructions (Signed)
Take medication as prescribed. Keep clean and dry. Drink plenty of fluids.   Monitor blood pressure closely at home and follow up with primary care physician.  Follow up with your primary care physician this week. Return to Urgent care for new or worsening concerns.

## 2015-12-07 NOTE — ED Provider Notes (Signed)
[facility] ____________________________________________  Time seen: Approximately 7:50 PM  I have reviewed the triage vital signs and the nursing notes.   HISTORY  Chief Complaint Nail Problem   HPI Emily Boyer is a 70 y.o. female presents for evaluation of right thumb injury. Patient reports that just prior to arrival she was at work. 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 Patient states that some pieces of metal are very thin and somewhat larger. Patient reports she has to reach down and pick up small pieces of metal with her hands and move them. Patient states she felt some pain to her right thumb, take off her glove and noticed there was some bleeding at the bottom of her nail. Patient reports she is concerned that he piece of metal may be in her finger. Denies known foreign body revealed visualization of foreign body. Denies any other pain or injury. Denies any fall, head injury, sensation changes. Reports will range of motion. Reports right hand dominant.  Unsure of last tetanus immunization. Denies fevers, recent sickness, recent antibiotic use, other complaints. Patient reports pain is mild at this time and primarily with direct palpation. Denies chest pain, shortness of breath, vision changes, dizziness, weakness, headaches, recent sickness. Patient reports that she does have chronic high blood pressure that has been well controlled at home. Patient reports she believes her blood pressure is currently elevated due to pain and stress of having to come in tonight.   Past Medical History:  Diagnosis Date  . Bone spur    right side of neck  . Colitis   . Diverticulitis   . GERD (gastroesophageal reflux disease)   . Heart murmur    followed by PCP  . Hypertension     Patient Active Problem List   Diagnosis Date Noted  . Abnormal findings-gastrointestinal tract   . Encounter for general adult medical examination without  abnormal findings 07/04/2014  . Nerve root pain 07/04/2014  . Gastroesophageal reflux disease 07/04/2014  . HLD (hyperlipidemia) 07/04/2014  . Hypertension 07/04/2014    Past Surgical History:  Procedure Laterality Date  . BREAST BIOPSY Right    neg  . BREAST LUMPECTOMY    . COLONOSCOPY WITH PROPOFOL N/A 02/06/2015   Procedure: COLONOSCOPY WITH PROPOFOL;  Surgeon: [name], MD;  Location: [place];  Service: Endoscopy;  Laterality: N/A;  . TUBAL LIGATION    . VAGINAL HYSTERECTOMY      Current Outpatient Rx  . Order #: [id number] Class: Historical Med  . Order #: [id number] Class: Normal  . Order #: [id number] Class: Normal  . Order #: [id number] Class: Normal  . Order #: [id number] Class: Normal  . Order #: [id number] Class: Normal    No current facility-administered medications for this encounter.   Current Outpatient Prescriptions:  .  aspirin EC 81 MG tablet, Take by mouth., Disp: , Rfl:  .  hydrochlorothiazide (HYDRODIURIL) 25 MG tablet, Take 1 tablet (25 mg total) by mouth daily., Disp: 90 tablet, Rfl: 1 .  ranitidine (ZANTAC) 150 MG capsule, Take 1 capsule (150 mg total) by mouth every evening., Disp: 90 capsule, Rfl: 1 .  benzonatate (TESSALON) 100 MG capsule, Take 1 capsule (100 mg total) by mouth 2 (two) times daily as needed for cough., Disp: 20 capsule, Rfl: 0 .  cephALEXin (KEFLEX) 500 MG capsule, Take 1 capsule (500 mg total) by mouth 3 (three) times daily., Disp: 15 capsule, Rfl: 0 .  doxycycline (VIBRA-TABS) 100 MG tablet, Take 1 tablet (100  mg total) by mouth 2 (two) times daily., Disp: 20 tablet, Rfl: 0  Allergies Methocarbamol; Penicillins; and Tramadol hcl  Family History  Problem Relation Age of Onset  . Stroke Mother   . Arthritis Father   . Breast cancer Neg Hx     Social History Social History  Substance Use Topics  . Smoking status: Former Research scientist (life sciences)  . Smokeless tobacco: Never Used     Comment: quit 2014  . Alcohol use 1.2 oz/week    2  Glasses of wine per week    Review of Systems Constitutional: No fever/chills Eyes: No visual changes. ENT: No sore throat. Cardiovascular: Denies chest pain. Respiratory: Denies shortness of breath. Gastrointestinal: No abdominal pain.  No nausea, no vomiting.  No diarrhea.  No constipation. Genitourinary: Negative for dysuria. Musculoskeletal: Negative for back pain. Skin: Negative for rash. As above.  Neurological: Negative for headaches, focal weakness or numbness.  10-point ROS otherwise negative.  ____________________________________________   PHYSICAL EXAM:  VITAL SIGNS: ED Triage Vitals  Enc Vitals Group     BP 12/07/15 1908 (!) 188/99     Pulse Rate 12/07/15 1908 62     Resp 12/07/15 1908 18     Temp 12/07/15 1908 97.7 F (36.5 C)     Temp Source 12/07/15 1908 Oral     SpO2 12/07/15 1908 100 %     Weight 12/07/15 1909 140 lb (63.5 kg)     Height 12/07/15 1909 5\' 1"  (1.549 m)     Head Circumference --      Peak Flow --      Pain Score 12/07/15 1911 6     Pain Loc --      Pain Edu? --      Excl. in [place]? --    Today's Vitals   12/07/15 1911 12/07/15 1954 12/07/15 2030 12/07/15 2056  BP:  (!) 178/102 (!) 146/96   Pulse:      Resp:      Temp:      TempSrc:      SpO2:      Weight:      Height:      PainSc: 6    3     Constitutional: Alert and oriented. Well appearing and in no acute distress. Eyes: Conjunctivae are normal. PERRL. EOMI. ENT      Head: Normocephalic and atraumatic.      Mouth/Throat: Mucous membranes are moist. Cardiovascular: Normal rate, regular rhythm. Grossly normal heart sounds.  Good peripheral circulation. Respiratory: Normal respiratory effort without tachypnea nor retractions. Breath sounds are clear and equal bilaterally. No wheezes/rales/rhonchi.. Gastrointestinal: Soft and nontender.  Musculoskeletal:  Nontender with normal range of motion in all extremities. No midline cervical, thoracic or lumbar tenderness to palpation.    Neurologic:  Normal speech and language. No gross focal neurologic deficits are appreciated. Speech is normal. No gait instability.  Skin:  Skin is warm, dry and intact. No rash noted. Except for right distal thumb at distal thumbnail mild tenderness to direct palpation mild dried blood, and distal end of the nail seems to be slightly separated from the nailbed, no laceration seen, no foreign body seen, full range of motion, normal distal sensation, new motor or tendon deficit, right hand otherwise nontender.  Psychiatric: Mood and affect are normal. Speech and behavior are normal. Patient exhibits appropriate insight and judgment   ___________________________________________   LABS (all labs ordered are listed, but only abnormal results are displayed)  Labs Reviewed - No data  to display RADIOLOGY  Dg Finger Thumb Right  Result Date: 12/07/2015 CLINICAL DATA:  Possible foreign body.  Laceration. EXAM: RIGHT THUMB 2+V COMPARISON:  None. FINDINGS: Moderate degenerative changes are noted. No acute fracture or radiopaque foreign body. IMPRESSION: Degenerative changes but no fracture or radiopaque foreign body. Electronically Signed   By: Marijo Sanes M.D.   On: 12/07/2015 20:04   ____________________________________________   PROCEDURES Procedures    INITIAL IMPRESSION / ASSESSMENT AND PLAN / ED COURSE  Pertinent labs & imaging results that were available during my care of the patient were reviewed by me and considered in my medical decision making (see chart for details).  Well-appearing patient. No acute distress. Presents for complaints of right thumb injury occurring just prior to arrival at work. Patient concern of foreign body beneath nail. No visualized foreign body on inspection. No foreign body visualized on x-ray. Per patient foreign body would be a metal foreign body. Discussed with patient possible nail injury causing now 2 separate from nailbed versus possible puncture wound  with removal of foreign body when she took off her gloves. Wound cleaned and soaked in urgent care. Tetanus immunization updated. Will treat patient with oral cephalexin, cleaning and monitoring.  Finger splint applied to aid in support and protection.Discussed with patient may return to work but recommend keeping clean and dry.  Patient with chronic history of high blood pressure. Patient hypertensive in urgent care. Clonidine 0.1 mg given once in urgent care and patient responded well with improved blood pressure. Patient states that she will monitor her blood pressure at home, keep a journal follow up with her primary care physician. Denies other symptoms.  Discussed follow up with Primary care physician this week. Discussed follow up and return parameters including no resolution or any worsening concerns. Patient verbalized understanding and agreed to plan.   ____________________________________________   FINAL CLINICAL IMPRESSION(S) / ED DIAGNOSES  Final diagnoses:  Injury of right thumb, initial encounter  Hypertension, unspecified type     Discharge Medication List as of 12/07/2015  8:48 PM    START taking these medications   Details  cephALEXin (KEFLEX) 500 MG capsule Take 1 capsule (500 mg total) by mouth 3 (three) times daily., Starting Thu 12/07/2015, Until Tue 12/12/2015, Normal        Note: This dictation was prepared with Dragon dictation along with smaller phrase technology. Any transcriptional errors that result from this process are unintentional.    Clinical Course       Marylene Land, NP 12/07/15 2119    Marylene Land, NP 12/07/15 2119

## 2015-12-07 NOTE — ED Triage Notes (Addendum)
Pt was working with on gloves and when she took them off she had pain under her right thumb and it was bleeding, so her supervisor sent her over to urgent care. She feels like there is a piece of metal under it.

## 2015-12-13 ENCOUNTER — Ambulatory Visit (INDEPENDENT_AMBULATORY_CARE_PROVIDER_SITE_OTHER): Payer: Medicare HMO | Admitting: Family Medicine

## 2015-12-13 ENCOUNTER — Encounter: Payer: Self-pay | Admitting: Family Medicine

## 2015-12-13 VITALS — BP 130/82 | HR 78 | Ht 61.0 in | Wt 144.0 lb

## 2015-12-13 DIAGNOSIS — Z23 Encounter for immunization: Secondary | ICD-10-CM | POA: Diagnosis not present

## 2015-12-13 DIAGNOSIS — I10 Essential (primary) hypertension: Secondary | ICD-10-CM | POA: Diagnosis not present

## 2015-12-13 DIAGNOSIS — R590 Localized enlarged lymph nodes: Secondary | ICD-10-CM | POA: Diagnosis not present

## 2015-12-13 DIAGNOSIS — T148XXA Other injury of unspecified body region, initial encounter: Secondary | ICD-10-CM | POA: Diagnosis not present

## 2015-12-13 NOTE — Progress Notes (Signed)
Name: Emily Boyer   MRN: [id number]    DOB: 09-Sep-1945   Date:12/13/2015       Progress Note  Subjective  Chief Complaint  Chief Complaint  Patient presents with  . Follow-up    urgent care- metal piece in finger- xray didn't show any pieces- finger feels better now    Follow up for blood pressure and recheck of forign body of right index finger. Also recheck of nodular area in supraclavicular area.   Hypertension  This is a chronic problem. The current episode started more than 1 year ago. The problem has been gradually improving since onset. The problem is controlled. Pertinent negatives include no anxiety, blurred vision, chest pain, headaches, malaise/fatigue, neck pain, orthopnea, palpitations, peripheral edema, PND, shortness of breath or sweats. There are no associated agents to hypertension. There are no known risk factors for coronary artery disease. Past treatments include diuretics. There are no compliance problems.  There is no history of angina, kidney disease, CAD/MI, CVA, heart failure, left ventricular hypertrophy, PVD, renovascular disease or retinopathy. There is no history of chronic renal disease or a hypertension causing med.    No problem-specific Assessment & Plan notes found for this encounter.   Past Medical History:  Diagnosis Date  . Bone spur    right side of neck  . Colitis   . Diverticulitis   . GERD (gastroesophageal reflux disease)   . Heart murmur    followed by PCP  . Hypertension     Past Surgical History:  Procedure Laterality Date  . BREAST BIOPSY Right    neg  . BREAST LUMPECTOMY    . COLONOSCOPY WITH PROPOFOL N/A 02/06/2015   Procedure: COLONOSCOPY WITH PROPOFOL;  Surgeon: [name], MD;  Location: [place];  Service: Endoscopy;  Laterality: N/A;  . TUBAL LIGATION    . VAGINAL HYSTERECTOMY      Family History  Problem Relation Age of Onset  . Stroke Mother   . Arthritis Father   . Breast cancer Neg Hx      Social History   Social History  . Marital status: Married    Spouse name: N/A  . Number of children: N/A  . Years of education: N/A   Occupational History  . Not on file.   Social History Main Topics  . Smoking status: Former Research scientist (life sciences)  . Smokeless tobacco: Never Used     Comment: quit 2014  . Alcohol use 1.2 oz/week    2 Glasses of wine per week  . Drug use: No  . Sexual activity: Yes    Birth control/ protection: None   Other Topics Concern  . Not on file   Social History Narrative  . No narrative on file    Allergies  Allergen Reactions  . Methocarbamol Itching  . Penicillins Itching  . Tramadol Hcl Itching     Review of Systems  Constitutional: Negative for chills, fever, malaise/fatigue and weight loss.  HENT: Negative for ear discharge, ear pain and sore throat.   Eyes: Negative for blurred vision.  Respiratory: Negative for cough, sputum production, shortness of breath and wheezing.   Cardiovascular: Negative for chest pain, palpitations, orthopnea, leg swelling and PND.  Gastrointestinal: Negative for abdominal pain, blood in stool, constipation, diarrhea, heartburn, melena and nausea.  Genitourinary: Negative for dysuria, frequency, hematuria and urgency.  Musculoskeletal: Negative for back pain, joint pain, myalgias and neck pain.  Skin: Negative for rash.  Neurological: Negative for dizziness, tingling, sensory change, focal  weakness and headaches.  Endo/Heme/Allergies: Negative for environmental allergies and polydipsia. Does not bruise/bleed easily.  Psychiatric/Behavioral: Negative for depression and suicidal ideas. The patient is not nervous/anxious and does not have insomnia.      Objective  Vitals:   12/13/15 0932  BP: 130/82  Pulse: 78  Weight: 144 lb (65.3 kg)  Height: 5\' 1"  (1.549 m)    Physical Exam  Constitutional: She is well-developed, well-nourished, and in no distress. No distress.  HENT:  Head: Normocephalic and  atraumatic.  Right Ear: External ear normal.  Left Ear: External ear normal.  Nose: Nose normal.  Mouth/Throat: Oropharynx is clear and moist.  Eyes: Conjunctivae and EOM are normal. Pupils are equal, round, and reactive to light. Right eye exhibits no discharge. Left eye exhibits no discharge.  Neck: Normal range of motion. Neck supple. No JVD present. No thyromegaly present.  Cardiovascular: Normal rate, regular rhythm, normal heart sounds and intact distal pulses.  Exam reveals no gallop and no friction rub.   No murmur heard. Pulmonary/Chest: Effort normal and breath sounds normal. She has no wheezes. She has no rales.  Abdominal: Soft. Bowel sounds are normal. She exhibits no mass. There is no tenderness. There is no guarding.  Musculoskeletal: Normal range of motion. She exhibits no edema.  Lymphadenopathy:    She has no cervical adenopathy.  Neurological: She is alert.  Skin: Skin is warm and dry. She is not diaphoretic.     Palpable ? Lipoma vs lymph node   Psychiatric: Mood and affect normal.  Nursing note and vitals reviewed.     Assessment & Plan  Problem List Items Addressed This Visit    None    Visit Diagnoses    Essential hypertension    -  Primary   Lymphadenopathy, supraclavicular       Relevant Orders   Ambulatory referral to General Surgery   DG Chest 2 View   Immunization due       Puncture wound            Dr. Macon [facility]  12/13/15

## 2015-12-18 ENCOUNTER — Ambulatory Visit
Admission: RE | Admit: 2015-12-18 | Discharge: 2015-12-18 | Disposition: A | Discharge status: Home / Self Care | Payer: Medicare HMO | Source: Ambulatory Visit | Attending: Family Medicine | Admitting: Family Medicine

## 2015-12-18 DIAGNOSIS — R05 Cough: Secondary | ICD-10-CM | POA: Diagnosis not present

## 2015-12-18 DIAGNOSIS — R590 Localized enlarged lymph nodes: Secondary | ICD-10-CM | POA: Insufficient documentation

## 2015-12-18 DIAGNOSIS — J449 Chronic obstructive pulmonary disease, unspecified: Secondary | ICD-10-CM | POA: Diagnosis not present

## 2015-12-18 DIAGNOSIS — I700 Atherosclerosis of aorta: Secondary | ICD-10-CM | POA: Insufficient documentation

## 2015-12-19 ENCOUNTER — Other Ambulatory Visit: Payer: Self-pay

## 2015-12-20 ENCOUNTER — Ambulatory Visit (INDEPENDENT_AMBULATORY_CARE_PROVIDER_SITE_OTHER): Payer: Medicare HMO | Admitting: Surgery

## 2015-12-20 ENCOUNTER — Encounter: Payer: Self-pay | Admitting: Surgery

## 2015-12-20 VITALS — BP 155/96 | HR 61 | Temp 98.2°F | Ht 61.0 in | Wt 147.0 lb

## 2015-12-20 DIAGNOSIS — L723 Sebaceous cyst: Secondary | ICD-10-CM

## 2015-12-20 NOTE — Patient Instructions (Signed)
Please call our office if you decide you would like this removed or if this area becomes red and inflammed.  Epidermal Cyst An epidermal cyst is sometimes called a sebaceous cyst, epidermal inclusion cyst, or infundibular cyst. These cysts usually contain a substance that looks "pasty" or "cheesy" and may have a bad smell. This substance is a protein called keratin. Epidermal cysts are usually found on the face, neck, or trunk. They may also occur in the vaginal area or other parts of the genitalia of both men and women. Epidermal cysts are usually small, painless, slow-growing bumps or lumps that move freely under the skin. It is important not to try to pop them. This may cause an infection and lead to tenderness and swelling. CAUSES  Epidermal cysts may be caused by a deep penetrating injury to the skin or a plugged hair follicle, often associated with acne. SYMPTOMS  Epidermal cysts can become inflamed and cause:  Redness.  Tenderness.  Increased temperature of the skin over the bumps or lumps.  Grayish-white, bad smelling material that drains from the bump or lump. DIAGNOSIS  Epidermal cysts are easily diagnosed by your caregiver during an exam. Rarely, a tissue sample (biopsy) may be taken to rule out other conditions that may resemble epidermal cysts. TREATMENT   Epidermal cysts often get better and disappear on their own. They are rarely ever cancerous.  If a cyst becomes infected, it may become inflamed and tender. This may require opening and draining the cyst. Treatment with antibiotics may be necessary. When the infection is gone, the cyst may be removed with minor surgery.  Small, inflamed cysts can often be treated with antibiotics or by injecting steroid medicines.  Sometimes, epidermal cysts become large and bothersome. If this happens, surgical removal in your caregiver's office may be necessary. HOME CARE INSTRUCTIONS  Only take over-the-counter or prescription medicines  as directed by your caregiver.  Take your antibiotics as directed. Finish them even if you start to feel better. SEEK MEDICAL CARE IF:   Your cyst becomes tender, red, or swollen.  Your condition is not improving or is getting worse.  You have any other questions or concerns. MAKE SURE YOU:  Understand these instructions.  Will watch your condition.  Will get help right away if you are not doing well or get worse. This information is not intended to replace advice given to you by your health care provider. Make sure you discuss any questions you have with your health care provider. Document Released: 12/09/2003 Document Revised: 04/01/2011 Document Reviewed: 11/09/2014 Elsevier Interactive Patient Education  2017 [employer].

## 2015-12-20 NOTE — Progress Notes (Signed)
Subjective:     Patient ID: Emily Boyer, female   DOB: 10-07-1945, 70 y.o.   MRN: [id number]  HPI  70 year old female that see her today with complaint of a left neck/shoulder area nodule the was concern for potential lymph node. The patient states that it's minute she's noticed it there for years but over the past couple of years she feels like it's been increasing in size. Patient states that his never had any drainage or any redness or swelling. Patient states that is slightly tender occasionally to touch. Patient does state that she also has an area on her left forearm that is soft and has been there for over 20 years that she does not feel it is enlarging. Patient states that her mother used to have things like this that would come up on the skin as well.  The patient is not had any fevers or chills any redness or swelling or any drainage from either area. Patient does have a history of sinus infections with bad allergies around the fall time of the year. Patient has had normal mammograms and her last one was done in July which was negative. The patient has had a biopsy of her right breast for benign lesion quite a few years back but has not had any abnormalities on mammogram since.  Past Medical History:  Diagnosis Date  . Bone spur    right side of neck  . Colitis   . Diverticulitis   . GERD (gastroesophageal reflux disease)   . Heart murmur    followed by PCP  . Hypertension    Past Surgical History:  Procedure Laterality Date  . BREAST BIOPSY Right 2004   neg  . BREAST LUMPECTOMY Right 2004   benign  . COLONOSCOPY WITH PROPOFOL N/A 02/06/2015   Procedure: COLONOSCOPY WITH PROPOFOL;  Surgeon: [name], MD;  Location: [facility];  Service: Endoscopy;  Laterality: N/A;  . TUBAL LIGATION  1980s  . VAGINAL HYSTERECTOMY  1990s   one ovary removed as well   Family History  Problem Relation Age of Onset  . Stroke Mother   . Arthritis Father   . Heart disease Paternal  Aunt   . Cancer Paternal Grandmother   . Stomach cancer Paternal Grandmother   . Breast cancer Cousin    Social History   Social History  . Marital status: Married    Spouse name: N/A  . Number of children: N/A  . Years of education: N/A   Social History Main Topics  . Smoking status: Former Smoker    Packs/day: 0.50    Years: 50.00    Quit date: 11/22/2011  . Smokeless tobacco: Never Used     Comment: quit 2014  . Alcohol use 1.2 oz/week    2 Glasses of wine per week  . Drug use: No  . Sexual activity: Yes    Birth control/ protection: None   Other Topics Concern  . None   Social History Narrative  . None    Current Outpatient Prescriptions:  .  aspirin EC 81 MG tablet, Take by mouth., Disp: , Rfl:  .  hydrochlorothiazide (HYDRODIURIL) 25 MG tablet, Take 1 tablet (25 mg total) by mouth daily., Disp: 90 tablet, Rfl: 1 .  ranitidine (ZANTAC) 150 MG capsule, Take 1 capsule (150 mg total) by mouth every evening., Disp: 90 capsule, Rfl: 1 Allergies  Allergen Reactions  . Methocarbamol Itching  . Penicillins Itching  . Tramadol Hcl Itching  Review of Systems  Constitutional: Negative for activity change, appetite change, chills, fatigue, fever and unexpected weight change.  HENT: Positive for postnasal drip, rhinorrhea, sinus pain and sinus pressure. Negative for congestion and sore throat.   Respiratory: Negative for cough, choking, chest tightness, shortness of breath, wheezing and stridor.   Cardiovascular: Negative for chest pain, palpitations and leg swelling.  Gastrointestinal: Negative for abdominal pain, constipation, nausea and vomiting.  Genitourinary: Negative for dysuria and hematuria.  Musculoskeletal: Positive for neck pain. Negative for back pain, joint swelling, myalgias and neck stiffness.  Skin: Negative for color change, pallor, rash and wound.  Neurological: Negative for dizziness and weakness.  Hematological: Negative for adenopathy. Does not  bruise/bleed easily.  Psychiatric/Behavioral: Negative for agitation. The patient is not nervous/anxious.   All other systems reviewed and are negative.  Vitals:   12/20/15 0922  BP: (!) 155/96  Pulse: 61  Temp: 98.2 F (36.8 C)      Objective:   Physical Exam  Constitutional: She is oriented to person, place, and time. She appears well-developed and well-nourished. No distress.  HENT:  Head: Normocephalic and atraumatic.  Right Ear: External ear normal.  Left Ear: External ear normal.  Nose: Nose normal.  Mouth/Throat: Oropharynx is clear and moist. No oropharyngeal exudate.  Eyes: Conjunctivae and EOM are normal. Pupils are equal, round, and reactive to light. No scleral icterus.  Neck: Normal range of motion. Neck supple. No tracheal deviation present. No thyromegaly present.  Cardiovascular: Normal rate, regular rhythm, normal heart sounds and intact distal pulses.  Exam reveals no gallop and no friction rub.   No murmur heard. Pulmonary/Chest: Effort normal and breath sounds normal. No stridor. No respiratory distress. She has no wheezes. She has no rales. She exhibits no tenderness.  Abdominal: Soft. Bowel sounds are normal. She exhibits no distension. There is no tenderness. There is no rebound and no guarding.  Musculoskeletal: Normal range of motion. She exhibits no edema, tenderness or deformity.  Lymphadenopathy:    She has no cervical adenopathy.  Neurological: She is alert and oriented to person, place, and time. No cranial nerve deficit.  Skin: Skin is warm and dry. No rash noted. No erythema. No pallor.  Left neck/shoulder junction just over the trapezius a 1 cm mobile rubbery soft mass with slight darkening of the skin over and a punctum can be seen suggestive of sebaceous cyst. No cervical lymphadenopathy and this is not in a place where the lymph node chains occur  Left forearm 1 cm lipoma deep soft rubbery nontender.  Psychiatric: She has a normal mood and  affect. Her behavior is normal. Judgment and thought content normal.  Vitals reviewed.  CBC Latest Ref Rng & Units 12/09/2014 12/07/2014  WBC 3.4 - 10.8 x10E3/uL 6.7 8.6  Hemoglobin 12.0 - 16.0 g/dL - 14.1  Hematocrit 34.0 - 46.6 % 38.3 40.8  Platelets 150 - 379 x10E3/uL 287 250   CMP Latest Ref Rng & Units 07/17/2015 12/07/2014 07/05/2014  Glucose 65 - 99 mg/dL 83 106(H) 89  BUN 8 - 27 mg/dL 12 15 13   Creatinine 0.57 - 1.00 mg/dL 0.73 0.71 0.74  Sodium 134 - 144 mmol/L 140 137 143  Potassium 3.5 - 5.2 mmol/L 4.8 3.6 4.2  Chloride 96 - 106 mmol/L 94(L) 102 102  CO2 18 - 29 mmol/L 27 26 26   Calcium 8.7 - 10.3 mg/dL 9.8 9.6 9.5  Total Protein 6.5 - 8.1 g/dL - 8.1 -  Total Bilirubin 0.3 - 1.2 mg/dL -  0.5 -  Alkaline Phos 38 - 126 U/L - 71 -  AST 15 - 41 U/L - 35 -  ALT 14 - 54 U/L - 27 -   CXR: IMPRESSION: COPD-chronic bronchitis. No pneumonia, CHF, nor other acute cardiopulmonary abnormality.  Thoracic aortic atherosclerosis.  No bony abnormality of the left clavicle is observed.   Electronically Signed   By: David  Martinique M.D.   On: 12/18/2015 10:07    Assessment:      70 year old female with a left neck/shoulder sebaceous cyst and no left forearm lipoma    Plan:     I have personally reviewed the patient's past medical history with well-controlled hypertension although her blood pressure is mildly elevated today,  and well-controlled GERD with the Zantac, as well as her past medical history from her episodes of colitis and reviewed her past brest biopsy findings.  Also reviewed her past laboratory findings so that she hasn't had any about 6 months. And I reviewed her most recent chest x-ray did show some pulmonary scarring and no acute abnormality. I also reviewed the read from the radiologist as above.  The nodule on her left neck/shoulder is a small sebaceous cyst which I discussed with her could continue to enlarge or even become infected. I discussed with her that  if it does become infected it might need to be lanced. I also discussed with her that if this area were to bother her and she would want to have it removed that it could be done in an office procedure. I discussed some of the risk benefits and alternatives to this including bleeding infection recurrence need for further procedures. The patient was given opportunity to ask questions and have them answered and she does not wish to have it removed at this time. I will make sure that she has her information in the case that she ever needs this area removed in the future.

## 2015-12-28 ENCOUNTER — Other Ambulatory Visit: Payer: Self-pay | Admitting: Family Medicine

## 2015-12-28 DIAGNOSIS — K219 Gastro-esophageal reflux disease without esophagitis: Secondary | ICD-10-CM

## 2016-01-01 ENCOUNTER — Other Ambulatory Visit: Payer: Self-pay

## 2016-01-01 DIAGNOSIS — K219 Gastro-esophageal reflux disease without esophagitis: Secondary | ICD-10-CM

## 2016-01-01 MED ORDER — RANITIDINE HCL 150 MG PO CAPS: ORAL_CAPSULE | ORAL | 1 refills | Status: DC

## 2016-01-17 ENCOUNTER — Ambulatory Visit (INDEPENDENT_AMBULATORY_CARE_PROVIDER_SITE_OTHER): Payer: Medicare HMO

## 2016-01-17 DIAGNOSIS — Z111 Encounter for screening for respiratory tuberculosis: Secondary | ICD-10-CM

## 2016-02-27 ENCOUNTER — Encounter: Payer: Self-pay | Admitting: Family Medicine

## 2016-02-27 ENCOUNTER — Ambulatory Visit (INDEPENDENT_AMBULATORY_CARE_PROVIDER_SITE_OTHER): Payer: Medicare HMO | Admitting: Family Medicine

## 2016-02-27 VITALS — BP 120/70 | HR 80 | Temp 98.8°F | Ht 61.0 in | Wt 147.0 lb

## 2016-02-27 DIAGNOSIS — J0100 Acute maxillary sinusitis, unspecified: Secondary | ICD-10-CM

## 2016-02-27 DIAGNOSIS — J40 Bronchitis, not specified as acute or chronic: Secondary | ICD-10-CM

## 2016-02-27 MED ORDER — AZITHROMYCIN 250 MG PO TABS: ORAL_TABLET | ORAL | 0 refills | Status: DC

## 2016-02-27 NOTE — Progress Notes (Signed)
Name: Emily Boyer   MRN: [id number]    DOB: [birth year]   Date:02/27/2016       Progress Note  Subjective  Chief Complaint  Chief Complaint  Patient presents with  . Cough    cong, sneezing, drainage in back of throat, watery eyes- tried flonase and robitussin otc    Cough  This is a new problem. The current episode started in the past 7 days. The problem has been gradually worsening. The problem occurs every few minutes. The cough is productive of purulent sputum (egg shell). Associated symptoms include chills, ear congestion, headaches, myalgias, nasal congestion, postnasal drip, rhinorrhea and a sore throat. Pertinent negatives include no chest pain, ear pain, eye redness, fever, heartburn, hemoptysis, rash, shortness of breath, sweats, weight loss or wheezing. The symptoms are aggravated by cold air. The treatment provided no relief. There is no history of environmental allergies.  Sinusitis  This is a new problem. The current episode started in the past 7 days. The problem has been waxing and waning since onset. The maximum temperature recorded prior to her arrival was 100.4 - 100.9 F. The pain is mild. Associated symptoms include chills, congestion, coughing, headaches, sinus pressure, sneezing and a sore throat. Pertinent negatives include no ear pain, neck pain or shortness of breath. (Yellow) Past treatments include nothing.    No problem-specific Assessment & Plan notes found for this encounter.   Past Medical History:  Diagnosis Date  . Bone spur    right side of neck  . Colitis   . Diverticulitis   . GERD (gastroesophageal reflux disease)   . Heart murmur    followed by PCP  . Hypertension     Past Surgical History:  Procedure Laterality Date  . BREAST BIOPSY Right 2004   neg  . BREAST LUMPECTOMY Right 2004   benign  . COLONOSCOPY WITH PROPOFOL N/A 02/06/2015   Procedure: COLONOSCOPY WITH PROPOFOL;  Surgeon: [name], MD;  Location: [facility];   Service: Endoscopy;  Laterality: N/A;  . TUBAL LIGATION  1980s  . VAGINAL HYSTERECTOMY  1990s   one ovary removed as well    Family History  Problem Relation Age of Onset  . Stroke Mother   . Arthritis Father   . Heart disease Paternal Aunt   . Cancer Paternal Grandmother   . Stomach cancer Paternal Grandmother   . Breast cancer Cousin     Social History   Social History  . Marital status: Married    Spouse name: N/A  . Number of children: N/A  . Years of education: N/A   Occupational History  . Not on file.   Social History Main Topics  . Smoking status: Former Smoker    Packs/day: 0.50    Years: 50.00    Quit date: 11/22/2011  . Smokeless tobacco: Never Used     Comment: quit 2014  . Alcohol use 1.2 oz/week    2 Glasses of wine per week  . Drug use: No  . Sexual activity: Yes    Birth control/ protection: None   Other Topics Concern  . Not on file   Social History Narrative  . No narrative on file    Allergies  Allergen Reactions  . Methocarbamol Itching  . Penicillins Itching  . Tramadol Hcl Itching     Review of Systems  Constitutional: Positive for chills. Negative for fever, malaise/fatigue and weight loss.  HENT: Positive for congestion, postnasal drip, rhinorrhea, sinus pressure, sneezing and sore  throat. Negative for ear discharge, ear pain and nosebleeds.   Eyes: Negative for blurred vision and redness.  Respiratory: Positive for cough. Negative for hemoptysis, sputum production, shortness of breath and wheezing.   Cardiovascular: Negative for chest pain, palpitations and leg swelling.  Gastrointestinal: Negative for abdominal pain, blood in stool, constipation, diarrhea, heartburn, melena and nausea.  Genitourinary: Negative for dysuria, frequency, hematuria and urgency.  Musculoskeletal: Positive for myalgias. Negative for back pain, joint pain and neck pain.  Skin: Negative for rash.  Neurological: Positive for headaches. Negative for  dizziness, tingling, sensory change and focal weakness.  Endo/Heme/Allergies: Negative for environmental allergies and polydipsia. Does not bruise/bleed easily.  Psychiatric/Behavioral: Negative for depression and suicidal ideas. The patient is not nervous/anxious and does not have insomnia.      Objective  Vitals:   02/27/16 1016  BP: 120/70  Pulse: 80  Temp: 98.8 F (37.1 C)  Weight: 147 lb (66.7 kg)  Height: 5\' 1"  (1.549 m)    Physical Exam  Constitutional: She is well-developed, well-nourished, and in no distress. No distress.  HENT:  Head: Normocephalic and atraumatic.  Right Ear: External ear normal.  Left Ear: External ear normal.  Nose: Right sinus exhibits maxillary sinus tenderness and frontal sinus tenderness. Left sinus exhibits maxillary sinus tenderness and frontal sinus tenderness.  Mouth/Throat: Oropharynx is clear and moist.  Eyes: Conjunctivae and EOM are normal. Pupils are equal, round, and reactive to light. Right eye exhibits no discharge. Left eye exhibits no discharge.  Neck: Normal range of motion. Neck supple. No JVD present. No thyromegaly present.  Cardiovascular: Normal rate, regular rhythm, normal heart sounds and intact distal pulses.  Exam reveals no gallop and no friction rub.   No murmur heard. Pulmonary/Chest: Effort normal and breath sounds normal. She has no wheezes. She has no rales.  Abdominal: Soft. Bowel sounds are normal. She exhibits no mass. There is no tenderness. There is no guarding.  Musculoskeletal: Normal range of motion. She exhibits no edema.  Lymphadenopathy:       Head (right side): No submental and no submandibular adenopathy present.       Head (left side): No submental adenopathy present.    She has no cervical adenopathy.  Neurological: She is alert. She has normal reflexes.  Skin: Skin is warm and dry. She is not diaphoretic.  Psychiatric: Mood and affect normal.  Nursing note and vitals reviewed.     Assessment &  Plan  Problem List Items Addressed This Visit    None    Visit Diagnoses    Acute maxillary sinusitis, recurrence not specified    -  Primary   Relevant Medications   azithromycin (ZITHROMAX) 250 MG tablet   Bronchitis       Relevant Medications   azithromycin (ZITHROMAX) 250 MG tablet        Dr. Tayah Idrovo [facility]  02/27/16

## 2016-03-12 ENCOUNTER — Other Ambulatory Visit: Payer: Self-pay

## 2016-03-12 DIAGNOSIS — I10 Essential (primary) hypertension: Secondary | ICD-10-CM

## 2016-03-12 MED ORDER — HYDROCHLOROTHIAZIDE 25 MG PO TABS: 25.0000 mg | ORAL_TABLET | Freq: Every day | ORAL | 0 refills | Status: DC

## 2016-06-12 ENCOUNTER — Other Ambulatory Visit: Payer: Self-pay

## 2016-06-12 DIAGNOSIS — I10 Essential (primary) hypertension: Secondary | ICD-10-CM

## 2016-06-12 MED ORDER — HYDROCHLOROTHIAZIDE 25 MG PO TABS: 25.0000 mg | ORAL_TABLET | Freq: Every day | ORAL | 0 refills | Status: DC

## 2016-06-27 ENCOUNTER — Ambulatory Visit (INDEPENDENT_AMBULATORY_CARE_PROVIDER_SITE_OTHER): Payer: Medicare HMO | Admitting: Family Medicine

## 2016-06-27 ENCOUNTER — Encounter: Payer: Self-pay | Admitting: Family Medicine

## 2016-06-27 VITALS — BP 120/80 | HR 70 | Ht 61.0 in | Wt 154.0 lb

## 2016-06-27 DIAGNOSIS — K219 Gastro-esophageal reflux disease without esophagitis: Secondary | ICD-10-CM | POA: Diagnosis not present

## 2016-06-27 DIAGNOSIS — L040 Acute lymphadenitis of face, head and neck: Secondary | ICD-10-CM | POA: Diagnosis not present

## 2016-06-27 MED ORDER — DOXYCYCLINE HYCLATE 100 MG PO TABS: 100.0000 mg | ORAL_TABLET | Freq: Two times a day (BID) | ORAL | 0 refills | Status: DC

## 2016-06-27 MED ORDER — RANITIDINE HCL 150 MG PO CAPS: ORAL_CAPSULE | ORAL | 1 refills | Status: DC

## 2016-06-27 NOTE — Progress Notes (Signed)
Name: Emily Boyer   MRN: [id number]    DOB: 1945/12/18   Date:06/27/2016       Progress Note  Subjective  Chief Complaint  Chief Complaint  Patient presents with  . Adenopathy    R) side neck pain when "mashes in", facial pressure    Neck Pain   This is a new problem. The current episode started yesterday. The problem occurs constantly. The problem has been gradually worsening. The pain is present in the right side. The quality of the pain is described as aching ("throbbing"). The pain is at a severity of 5/10. The pain is mild. The symptoms are aggravated by coughing. Pertinent negatives include no chest pain, fever, headaches, numbness, paresis, syncope, tingling, weakness or weight loss. She has tried NSAIDs for the symptoms. The treatment provided mild relief.  Gastroesophageal Reflux  She reports no abdominal pain, no chest pain, no coughing, no heartburn, no nausea, no sore throat or no wheezing. This is a chronic problem. The current episode started more than 1 year ago. The problem has been gradually improving. The symptoms are aggravated by certain foods. Pertinent negatives include no melena or weight loss. She has tried a histamine-2 antagonist for the symptoms.    No problem-specific Assessment & Plan notes found for this encounter.   Past Medical History:  Diagnosis Date  . Bone spur    right side of neck  . Colitis   . Diverticulitis   . GERD (gastroesophageal reflux disease)   . Heart murmur    followed by PCP  . Hypertension     Past Surgical History:  Procedure Laterality Date  . BREAST BIOPSY Right 2004   neg  . BREAST LUMPECTOMY Right 2004   benign  . COLONOSCOPY WITH PROPOFOL N/A 02/06/2015   Procedure: COLONOSCOPY WITH PROPOFOL;  Surgeon: [name], MD;  Location: [facility];  Service: Endoscopy;  Laterality: N/A;  . TUBAL LIGATION  1980s  . VAGINAL HYSTERECTOMY  1990s   one ovary removed as well    Family History  Problem Relation Age  of Onset  . Stroke Mother   . Arthritis Father   . Heart disease Paternal Aunt   . Cancer Paternal Grandmother   . Stomach cancer Paternal Grandmother   . Breast cancer Cousin     Social History   Social History  . Marital status: Married    Spouse name: N/A  . Number of children: N/A  . Years of education: N/A   Occupational History  . Not on file.   Social History Main Topics  . Smoking status: Former Smoker    Packs/day: 0.50    Years: 50.00    Quit date: 11/22/2011  . Smokeless tobacco: Never Used     Comment: quit 2014  . Alcohol use 1.2 oz/week    2 Glasses of wine per week  . Drug use: No  . Sexual activity: Yes    Birth control/ protection: None   Other Topics Concern  . Not on file   Social History Narrative  . No narrative on file    Allergies  Allergen Reactions  . Methocarbamol Itching  . Penicillins Itching  . Tramadol Hcl Itching    Outpatient Medications Prior to Visit  Medication Sig Dispense Refill  . aspirin EC 81 MG tablet Take by mouth.    . hydrochlorothiazide (HYDRODIURIL) 25 MG tablet Take 1 tablet (25 mg total) by mouth daily. 30 tablet 0  . ranitidine (ZANTAC) 150 MG  capsule TAKE 1 CAPSULE EVERY EVENING 90 capsule 1  . azithromycin (ZITHROMAX) 250 MG tablet 2 today then 1 a day for 4 days 6 tablet 0   No facility-administered medications prior to visit.     Review of Systems  Constitutional: Negative for chills, fever, malaise/fatigue and weight loss.  HENT: Negative for ear discharge, ear pain and sore throat.   Eyes: Negative for blurred vision.  Respiratory: Negative for cough, sputum production, shortness of breath and wheezing.   Cardiovascular: Negative for chest pain, palpitations, leg swelling and syncope.  Gastrointestinal: Negative for abdominal pain, blood in stool, constipation, diarrhea, heartburn, melena and nausea.  Genitourinary: Negative for dysuria, frequency, hematuria and urgency.  Musculoskeletal: Positive  for neck pain. Negative for back pain, joint pain and myalgias.  Skin: Negative for rash.  Neurological: Negative for dizziness, tingling, sensory change, focal weakness, weakness, numbness and headaches.  Endo/Heme/Allergies: Negative for environmental allergies and polydipsia. Does not bruise/bleed easily.  Psychiatric/Behavioral: Negative for depression and suicidal ideas. The patient is not nervous/anxious and does not have insomnia.      Objective  Vitals:   06/27/16 0812  BP: 120/80  Pulse: 70  Weight: 154 lb (69.9 kg)  Height: 5\' 1"  (1.549 m)    Physical Exam  Constitutional: She is well-developed, well-nourished, and in no distress. No distress.  HENT:  Head: Normocephalic and atraumatic.  Right Ear: Tympanic membrane, external ear and ear canal normal.  Left Ear: Tympanic membrane, external ear and ear canal normal.  Nose: Nose normal.  Mouth/Throat: Uvula is midline, oropharynx is clear and moist and mucous membranes are normal. No oropharyngeal exudate, posterior oropharyngeal edema or posterior oropharyngeal erythema.  Eyes: Conjunctivae and EOM are normal. Pupils are equal, round, and reactive to light. Right eye exhibits no discharge. Left eye exhibits no discharge.  Neck: Normal range of motion. Neck supple. No JVD present. No thyromegaly present.  Cardiovascular: Normal rate, regular rhythm, normal heart sounds and intact distal pulses.  Exam reveals no gallop and no friction rub.   No murmur heard. Pulmonary/Chest: Effort normal and breath sounds normal. She has no wheezes. She has no rales.  Abdominal: Soft. Bowel sounds are normal. She exhibits no mass. There is no tenderness. There is no guarding.  Musculoskeletal: Normal range of motion. She exhibits no edema.  Lymphadenopathy:       Head (right side): No submental and no submandibular adenopathy present.       Head (left side): No submental, no submandibular and no tonsillar adenopathy present.    She has  cervical adenopathy.       Right cervical: Superficial cervical and deep cervical adenopathy present. No posterior cervical adenopathy present.      Left cervical: No superficial cervical, no deep cervical and no posterior cervical adenopathy present.  Neurological: She is alert. She has normal reflexes.  Skin: Skin is warm and dry. She is not diaphoretic.  Psychiatric: Mood and affect normal.  Nursing note and vitals reviewed.     Assessment & Plan  Problem List Items Addressed This Visit      Digestive   Gastroesophageal reflux disease   Relevant Medications   ranitidine (ZANTAC) 150 MG capsule    Other Visit Diagnoses    Acute cervical adenitis    -  Primary   Relevant Medications   doxycycline (VIBRA-TABS) 100 MG tablet      Meds ordered this encounter  Medications  . doxycycline (VIBRA-TABS) 100 MG tablet    Sig:  Take 1 tablet (100 mg total) by mouth 2 (two) times daily.    Dispense:  20 tablet    Refill:  0  . ranitidine (ZANTAC) 150 MG capsule    Sig: TAKE 1 CAPSULE EVERY EVENING    Dispense:  90 capsule    Refill:  1      Dr. Deanna Jones [facility]  06/27/16

## 2016-07-02 ENCOUNTER — Other Ambulatory Visit: Payer: Self-pay

## 2016-07-02 DIAGNOSIS — K219 Gastro-esophageal reflux disease without esophagitis: Secondary | ICD-10-CM

## 2016-07-02 MED ORDER — RANITIDINE HCL 150 MG PO TABS: 150.0000 mg | ORAL_TABLET | Freq: Every day | ORAL | 1 refills | Status: DC

## 2016-07-07 ENCOUNTER — Other Ambulatory Visit: Payer: Self-pay | Admitting: Family Medicine

## 2016-07-07 DIAGNOSIS — I10 Essential (primary) hypertension: Secondary | ICD-10-CM

## 2016-07-18 ENCOUNTER — Other Ambulatory Visit: Payer: Self-pay | Admitting: Family Medicine

## 2016-07-18 ENCOUNTER — Telehealth: Payer: Self-pay | Admitting: Family Medicine

## 2016-07-18 MED ORDER — PANTOPRAZOLE SODIUM 20 MG PO TBEC: 20.0000 mg | DELAYED_RELEASE_TABLET | Freq: Every day | ORAL | 0 refills | Status: DC

## 2016-07-18 NOTE — Telephone Encounter (Signed)
Pt called stated  Rx Ranitidine is not working and still having indigestion--Dr. Eliezer Champagne to sent Rx. Pantoprazole 20 mg sent to the pharmacy and stop taking Ranitidine.

## 2016-08-08 ENCOUNTER — Other Ambulatory Visit: Payer: Self-pay | Admitting: Family Medicine

## 2016-08-08 DIAGNOSIS — I10 Essential (primary) hypertension: Secondary | ICD-10-CM

## 2016-09-12 ENCOUNTER — Other Ambulatory Visit: Payer: Self-pay | Admitting: Family Medicine

## 2016-09-12 DIAGNOSIS — I10 Essential (primary) hypertension: Secondary | ICD-10-CM

## 2016-11-05 ENCOUNTER — Other Ambulatory Visit: Payer: Self-pay | Admitting: Family Medicine

## 2016-11-05 DIAGNOSIS — I10 Essential (primary) hypertension: Secondary | ICD-10-CM

## 2016-11-18 ENCOUNTER — Encounter: Payer: Self-pay | Admitting: Family Medicine

## 2016-11-18 ENCOUNTER — Ambulatory Visit (INDEPENDENT_AMBULATORY_CARE_PROVIDER_SITE_OTHER): Payer: Medicare HMO | Admitting: Family Medicine

## 2016-11-18 VITALS — BP 120/80 | HR 72 | Temp 98.6°F | Ht 61.0 in | Wt 150.0 lb

## 2016-11-18 DIAGNOSIS — Z23 Encounter for immunization: Secondary | ICD-10-CM | POA: Diagnosis not present

## 2016-11-18 DIAGNOSIS — J0100 Acute maxillary sinusitis, unspecified: Secondary | ICD-10-CM

## 2016-11-18 DIAGNOSIS — J40 Bronchitis, not specified as acute or chronic: Secondary | ICD-10-CM | POA: Diagnosis not present

## 2016-11-18 MED ORDER — GUAIFENESIN-CODEINE 100-10 MG/5ML PO SYRP: 5.0000 mL | ORAL_SOLUTION | Freq: Three times a day (TID) | ORAL | 0 refills | Status: DC | PRN

## 2016-11-18 MED ORDER — AZITHROMYCIN 250 MG PO TABS: ORAL_TABLET | ORAL | 0 refills | Status: DC

## 2016-11-18 NOTE — Progress Notes (Signed)
Name: Emily Boyer   MRN: [id number]    DOB: 1945/11/03   Date:11/18/2016       Progress Note  Subjective  Chief Complaint  Chief Complaint  Patient presents with  . Sinusitis    cough, pressure in nose, popping in L) ear, cong, runny/ itchy eyes    Sinusitis  This is a new problem. The current episode started in the past 7 days. The problem has been gradually worsening since onset. The maximum temperature recorded prior to her arrival was 100.4 - 100.9 F (Friday). The fever has been present for 1 to 2 days. Associated symptoms include chills, congestion, coughing, diaphoresis, ear pain, headaches, sinus pressure, sneezing, a sore throat and swollen glands. Pertinent negatives include no hoarse voice, neck pain or shortness of breath. Past treatments include oral decongestants (mucinex sinus). The treatment provided mild relief.  Cough  This is a new problem. The current episode started in the past 7 days. The problem has been gradually worsening. The cough is productive of purulent sputum. Associated symptoms include chills, ear pain, headaches, nasal congestion, postnasal drip and a sore throat. Pertinent negatives include no chest pain, fever, heartburn, hemoptysis, myalgias, rash, shortness of breath, weight loss or wheezing. The symptoms are aggravated by cold air. She has tried OTC cough suppressant for the symptoms. The treatment provided no relief. There is no history of environmental allergies.    No problem-specific Assessment & Plan notes found for this encounter.   Past Medical History:  Diagnosis Date  . Bone spur    right side of neck  . Colitis   . Diverticulitis   . GERD (gastroesophageal reflux disease)   . Heart murmur    followed by PCP  . Hypertension     Past Surgical History:  Procedure Laterality Date  . BREAST BIOPSY Right 2004   neg  . BREAST LUMPECTOMY Right 2004   benign  . COLONOSCOPY WITH PROPOFOL N/A 02/06/2015   Procedure: COLONOSCOPY WITH  PROPOFOL;  Surgeon: [name], MD;  Location: [place];  Service: Endoscopy;  Laterality: N/A;  . TUBAL LIGATION  1980s  . VAGINAL HYSTERECTOMY  1990s   one ovary removed as well    Family History  Problem Relation Age of Onset  . Stroke Mother   . Arthritis Father   . Heart disease Paternal Aunt   . Cancer Paternal Grandmother   . Stomach cancer Paternal Grandmother   . Breast cancer Cousin     Social History   Social History  . Marital status: Married    Spouse name: N/A  . Number of children: N/A  . Years of education: N/A   Occupational History  . Not on file.   Social History Main Topics  . Smoking status: Former Smoker    Packs/day: 0.50    Years: 50.00    Quit date: 11/22/2011  . Smokeless tobacco: Never Used     Comment: quit 2014  . Alcohol use 1.2 oz/week    2 Glasses of wine per week  . Drug use: No  . Sexual activity: Yes    Birth control/ protection: None   Other Topics Concern  . Not on file   Social History Narrative  . No narrative on file    Allergies  Allergen Reactions  . Methocarbamol Itching  . Penicillins Itching  . Tramadol Hcl Itching    Outpatient Medications Prior to Visit  Medication Sig Dispense Refill  . aspirin EC 81 MG tablet Take  by mouth.    . hydrochlorothiazide (HYDRODIURIL) 25 MG tablet TAKE 1 TABLET BY MOUTH ONCE DAILY 30 tablet 0  . pantoprazole (PROTONIX) 20 MG tablet TAKE 1 TABLET BY MOUTH ONCE DAILY 30 tablet 0  . doxycycline (VIBRA-TABS) 100 MG tablet Take 1 tablet (100 mg total) by mouth 2 (two) times daily. 20 tablet 0  . hydrochlorothiazide (HYDRODIURIL) 25 MG tablet TAKE 1 TABLET BY MOUTH ONCE DAILY 30 tablet 0  . pantoprazole (PROTONIX) 20 MG tablet TAKE 1 TABLET BY MOUTH ONCE DAILY 30 tablet 0   No facility-administered medications prior to visit.     Review of Systems  Constitutional: Positive for chills and diaphoresis. Negative for fever, malaise/fatigue and weight loss.  HENT: Positive  for congestion, ear pain, postnasal drip, sinus pressure, sneezing and sore throat. Negative for ear discharge and hoarse voice.   Eyes: Negative for blurred vision.  Respiratory: Positive for cough. Negative for hemoptysis, sputum production, shortness of breath and wheezing.   Cardiovascular: Negative for chest pain, palpitations and leg swelling.  Gastrointestinal: Negative for abdominal pain, blood in stool, constipation, diarrhea, heartburn, melena and nausea.  Genitourinary: Negative for dysuria, frequency, hematuria and urgency.  Musculoskeletal: Negative for back pain, joint pain, myalgias and neck pain.  Skin: Negative for rash.  Neurological: Positive for headaches. Negative for dizziness, tingling, sensory change and focal weakness.  Endo/Heme/Allergies: Negative for environmental allergies and polydipsia. Does not bruise/bleed easily.  Psychiatric/Behavioral: Negative for depression and suicidal ideas. The patient is not nervous/anxious and does not have insomnia.      Objective  Vitals:   11/18/16 1629  BP: 120/80  Pulse: 72  Temp: 98.6 F (37 C)  TempSrc: Oral  Weight: 150 lb (68 kg)  Height: 5\' 1"  (1.549 m)    Physical Exam  Constitutional: She is well-developed, well-nourished, and in no distress. No distress.  HENT:  Head: Normocephalic and atraumatic.  Right Ear: External ear normal.  Left Ear: External ear normal.  Nose: Nose normal.  Mouth/Throat: Oropharynx is clear and moist.  Eyes: Pupils are equal, round, and reactive to light. Conjunctivae and EOM are normal. Right eye exhibits no discharge. Left eye exhibits no discharge.  Neck: Normal range of motion. Neck supple. No JVD present. No thyromegaly present.  Cardiovascular: Normal rate, regular rhythm, normal heart sounds and intact distal pulses.  Exam reveals no gallop and no friction rub.   No murmur heard. Pulmonary/Chest: Effort normal and breath sounds normal. She has no wheezes. She has no rales.   Abdominal: Soft. Bowel sounds are normal. She exhibits no mass. There is no tenderness. There is no guarding.  Musculoskeletal: Normal range of motion. She exhibits no edema.  Lymphadenopathy:    She has no cervical adenopathy.  Neurological: She is alert. She has normal reflexes.  Skin: Skin is warm and dry. She is not diaphoretic.  Psychiatric: Mood and affect normal.  Nursing note and vitals reviewed.     Assessment & Plan  Problem List Items Addressed This Visit    None    Visit Diagnoses    Acute maxillary sinusitis, recurrence not specified    -  Primary   Relevant Medications   azithromycin (ZITHROMAX) 250 MG tablet   guaiFENesin-codeine (ROBITUSSIN AC) 100-10 MG/5ML syrup   Bronchitis       Influenza vaccine needed       Relevant Orders   Flu vaccine HIGH DOSE PF (Fluzone High dose) (Completed)      Meds ordered this encounter  Medications  . azithromycin (ZITHROMAX) 250 MG tablet    Sig: 2 today then 1 a day for 4 days    Dispense:  6 tablet    Refill:  0  . guaiFENesin-codeine (ROBITUSSIN AC) 100-10 MG/5ML syrup    Sig: Take 5 mLs by mouth 3 (three) times daily as needed for cough.    Dispense:  100 mL    Refill:  0      Dr. [facility]  11/18/16

## 2016-12-03 ENCOUNTER — Ambulatory Visit: Payer: Medicare HMO | Admitting: Family Medicine

## 2016-12-04 ENCOUNTER — Ambulatory Visit: Payer: Medicare HMO | Admitting: Family Medicine

## 2016-12-04 ENCOUNTER — Encounter: Payer: Self-pay | Admitting: Family Medicine

## 2016-12-04 VITALS — BP 132/80 | HR 76 | Resp 16 | Ht 61.0 in | Wt 150.0 lb

## 2016-12-04 DIAGNOSIS — E785 Hyperlipidemia, unspecified: Secondary | ICD-10-CM | POA: Diagnosis not present

## 2016-12-04 DIAGNOSIS — K219 Gastro-esophageal reflux disease without esophagitis: Secondary | ICD-10-CM

## 2016-12-04 DIAGNOSIS — I10 Essential (primary) hypertension: Secondary | ICD-10-CM | POA: Diagnosis not present

## 2016-12-04 MED ORDER — HYDROCHLOROTHIAZIDE 25 MG PO TABS: 25.0000 mg | ORAL_TABLET | Freq: Every day | ORAL | 1 refills | Status: DC

## 2016-12-04 MED ORDER — PANTOPRAZOLE SODIUM 40 MG PO TBEC: 40.0000 mg | DELAYED_RELEASE_TABLET | Freq: Every day | ORAL | 1 refills | Status: DC

## 2016-12-04 NOTE — Progress Notes (Signed)
Name: Emily Boyer   MRN: [id number]    DOB: 1945-11-26   Date:12/04/2016       Progress Note  Subjective  Chief Complaint  No chief complaint on file.   Hypertension  This is a chronic problem. The current episode started more than 1 year ago. The problem is unchanged. The problem is controlled. Pertinent negatives include no anxiety, blurred vision, chest pain, headaches, malaise/fatigue, neck pain, orthopnea, palpitations, peripheral edema, PND, shortness of breath or sweats. There are no associated agents to hypertension. There are no known risk factors for coronary artery disease. Past treatments include diuretics. The current treatment provides mild improvement. There are no compliance problems.  There is no history of angina, kidney disease, CAD/MI, CVA, heart failure, left ventricular hypertrophy, PVD or retinopathy. There is no history of chronic renal disease, a hypertension causing med or renovascular disease.  Gastroesophageal Reflux  She reports no abdominal pain, no chest pain, no coughing, no dysphagia, no heartburn, no nausea, no sore throat or no wheezing. This is a chronic problem. The problem occurs constantly. The problem has been rapidly improving (improved with increase to 40mg  protonix). The symptoms are aggravated by certain foods. Pertinent negatives include no anemia, fatigue, melena, muscle weakness, orthopnea or weight loss. She has tried a PPI for the symptoms. The treatment provided moderate relief.    No problem-specific Assessment & Plan notes found for this encounter.   Past Medical History:  Diagnosis Date  . Bone spur    right side of neck  . Colitis   . Diverticulitis   . GERD (gastroesophageal reflux disease)   . Heart murmur    followed by PCP  . Hypertension     Past Surgical History:  Procedure Laterality Date  . BREAST BIOPSY Right 2004   neg  . BREAST LUMPECTOMY Right 2004   benign  . TUBAL LIGATION  1980s  . VAGINAL HYSTERECTOMY   1990s   one ovary removed as well    Family History  Problem Relation Age of Onset  . Stroke Mother   . Arthritis Father   . Heart disease Paternal Aunt   . Cancer Paternal Grandmother   . Stomach cancer Paternal Grandmother   . Breast cancer Cousin     Social History   Socioeconomic History  . Marital status: Married    Spouse name: Not on file  . Number of children: Not on file  . Years of education: Not on file  . Highest education level: Not on file  Social Needs  . Financial resource strain: Not on file  . Food insecurity - worry: Not on file  . Food insecurity - inability: Not on file  . Transportation needs - medical: Not on file  . Transportation needs - non-medical: Not on file  Occupational History  . Not on file  Tobacco Use  . Smoking status: Former Smoker    Packs/day: 0.50    Years: 50.00    Pack years: 25.00    Last attempt to quit: 11/22/2011    Years since quitting: 5.0  . Smokeless tobacco: Never Used  . Tobacco comment: quit 2014  Substance and Sexual Activity  . Alcohol use: Yes    Alcohol/week: 1.2 oz    Types: 2 Glasses of wine per week  . Drug use: No  . Sexual activity: Yes    Birth control/protection: None  Other Topics Concern  . Not on file  Social History Narrative  . Not on file  Allergies  Allergen Reactions  . Guaifenesin & Derivatives   . Methocarbamol Itching  . Penicillins Itching  . Tramadol Hcl Itching    Outpatient Medications Prior to Visit  Medication Sig Dispense Refill  . aspirin EC 81 MG tablet Take by mouth.    . hydrochlorothiazide (HYDRODIURIL) 25 MG tablet TAKE 1 TABLET BY MOUTH ONCE DAILY 30 tablet 0  . pantoprazole (PROTONIX) 20 MG tablet TAKE 1 TABLET BY MOUTH ONCE DAILY 30 tablet 0  . azithromycin (ZITHROMAX) 250 MG tablet 2 today then 1 a day for 4 days 6 tablet 0  . guaiFENesin-codeine (ROBITUSSIN AC) 100-10 MG/5ML syrup Take 5 mLs by mouth 3 (three) times daily as needed for cough. 100 mL 0   No  facility-administered medications prior to visit.     Review of Systems  Constitutional: Negative for chills, fatigue, fever, malaise/fatigue and weight loss.  HENT: Negative for ear discharge, ear pain and sore throat.   Eyes: Negative for blurred vision.  Respiratory: Negative for cough, sputum production, shortness of breath and wheezing.   Cardiovascular: Negative for chest pain, palpitations, orthopnea, leg swelling and PND.  Gastrointestinal: Negative for abdominal pain, blood in stool, constipation, diarrhea, dysphagia, heartburn, melena and nausea.  Genitourinary: Negative for dysuria, frequency, hematuria and urgency.  Musculoskeletal: Negative for back pain, joint pain, myalgias, muscle weakness and neck pain.  Skin: Negative for rash.  Neurological: Negative for dizziness, tingling, sensory change, focal weakness and headaches.  Endo/Heme/Allergies: Negative for environmental allergies and polydipsia. Does not bruise/bleed easily.  Psychiatric/Behavioral: Negative for depression and suicidal ideas. The patient is not nervous/anxious and does not have insomnia.      Objective  Vitals:   12/04/16 0950  BP: 132/80  Pulse: 76  Resp: 16  Weight: 150 lb (68 kg)  Height: 5\' 1"  (1.549 m)    Physical Exam  Constitutional: She is well-developed, well-nourished, and in no distress. No distress.  HENT:  Head: Normocephalic and atraumatic.  Right Ear: External ear normal.  Left Ear: External ear normal.  Nose: Nose normal.  Mouth/Throat: Oropharynx is clear and moist.  Eyes: Conjunctivae and EOM are normal. Pupils are equal, round, and reactive to light. Right eye exhibits no discharge. Left eye exhibits no discharge.  Neck: Normal range of motion. Neck supple. No JVD present. No thyromegaly present.  Cardiovascular: Normal rate, regular rhythm, normal heart sounds and intact distal pulses. Exam reveals no gallop and no friction rub.  No murmur heard. Pulmonary/Chest: Effort  normal and breath sounds normal. She has no wheezes. She has no rales.  Abdominal: Soft. Bowel sounds are normal. She exhibits no mass. There is no tenderness. There is no guarding.  Musculoskeletal: Normal range of motion. She exhibits no edema.  Lymphadenopathy:    She has no cervical adenopathy.  Neurological: She is alert. She has normal reflexes.  Skin: Skin is warm and dry. She is not diaphoretic.  Psychiatric: Mood and affect normal.  Nursing note and vitals reviewed.     Assessment & Plan  Problem List Items Addressed This Visit      Cardiovascular and Mediastinum   Hypertension - Primary   Relevant Medications   hydrochlorothiazide (HYDRODIURIL) 25 MG tablet   Other Relevant Orders   Renal Function Panel     Digestive   Gastroesophageal reflux disease   Relevant Medications   pantoprazole (PROTONIX) 40 MG tablet     Other   HLD (hyperlipidemia)   Relevant Medications   hydrochlorothiazide (HYDRODIURIL) 25 MG  tablet   Other Relevant Orders   Lipid panel      Meds ordered this encounter  Medications  . hydrochlorothiazide (HYDRODIURIL) 25 MG tablet    Sig: Take 1 tablet (25 mg total) daily by mouth.    Dispense:  90 tablet    Refill:  1    Please consider 90 day supplies to promote better adherence  . pantoprazole (PROTONIX) 40 MG tablet    Sig: Take 1 tablet (40 mg total) daily by mouth.    Dispense:  90 tablet    Refill:  1      Dr. Otilio Miu [facility]  12/04/16

## 2016-12-05 LAB — RENAL FUNCTION PANEL
ALBUMIN: 4.6 g/dL (ref 3.5–4.8)
BUN/Creatinine Ratio: 19 (ref 12–28)
BUN: 14 mg/dL (ref 8–27)
CALCIUM: 10 mg/dL (ref 8.7–10.3)
CHLORIDE: 96 mmol/L (ref 96–106)
CO2: 27 mmol/L (ref 20–29)
Creatinine, Ser: 0.75 mg/dL (ref 0.57–1.00)
GFR calc non Af Amer: 81 mL/min/{1.73_m2} (ref >59)
GFR, EST AFRICAN AMERICAN: 93 mL/min/{1.73_m2} (ref >59)
GLUCOSE: 88 mg/dL (ref 65–99)
PHOSPHORUS: 3.7 mg/dL (ref 2.5–4.5)
POTASSIUM: 4.2 mmol/L (ref 3.5–5.2)
Sodium: 141 mmol/L (ref 134–144)

## 2016-12-05 LAB — LIPID PANEL
CHOLESTEROL TOTAL: 192 mg/dL (ref 100–199)
Chol/HDL Ratio: 5.3 ratio — ABNORMAL HIGH (ref 0.0–4.4)
HDL: 36 mg/dL — AB (ref >39)
LDL Calculated: 96 mg/dL (ref 0–99)
TRIGLYCERIDES: 300 mg/dL — AB (ref 0–149)
VLDL Cholesterol Cal: 60 mg/dL — ABNORMAL HIGH (ref 5–40)

## 2017-01-08 ENCOUNTER — Other Ambulatory Visit: Payer: Self-pay | Admitting: Family Medicine

## 2017-01-08 DIAGNOSIS — Z1231 Encounter for screening mammogram for malignant neoplasm of breast: Secondary | ICD-10-CM

## 2017-01-21 HISTORY — PX: EYE SURGERY: SHX253

## 2017-01-28 ENCOUNTER — Ambulatory Visit
Admission: RE | Admit: 2017-01-28 | Discharge: 2017-01-28 | Disposition: A | Discharge status: Home / Self Care | Payer: Medicare HMO | Source: Ambulatory Visit | Attending: Family Medicine | Admitting: Family Medicine

## 2017-01-28 ENCOUNTER — Other Ambulatory Visit: Payer: Self-pay | Admitting: Family Medicine

## 2017-01-28 DIAGNOSIS — Z1231 Encounter for screening mammogram for malignant neoplasm of breast: Secondary | ICD-10-CM | POA: Diagnosis not present

## 2017-02-24 DIAGNOSIS — H25013 Cortical age-related cataract, bilateral: Secondary | ICD-10-CM | POA: Diagnosis not present

## 2017-04-11 DIAGNOSIS — H2513 Age-related nuclear cataract, bilateral: Secondary | ICD-10-CM | POA: Diagnosis not present

## 2017-04-18 DIAGNOSIS — H2511 Age-related nuclear cataract, right eye: Secondary | ICD-10-CM | POA: Diagnosis not present

## 2017-04-23 NOTE — Discharge Instructions (Signed)

## 2017-04-29 ENCOUNTER — Ambulatory Visit: Payer: Medicare HMO | Admitting: Anesthesiology

## 2017-04-29 ENCOUNTER — Encounter
Admission: RE | Disposition: A | Discharge status: Home / Self Care | Payer: Self-pay | Source: Ambulatory Visit | Attending: Ophthalmology

## 2017-04-29 ENCOUNTER — Ambulatory Visit
Admission: RE | Admit: 2017-04-29 | Discharge: 2017-04-29 | Disposition: A | Discharge status: Home / Self Care | Payer: Medicare HMO | Source: Ambulatory Visit | Attending: Ophthalmology | Admitting: Ophthalmology

## 2017-04-29 DIAGNOSIS — H2511 Age-related nuclear cataract, right eye: Secondary | ICD-10-CM | POA: Diagnosis not present

## 2017-04-29 DIAGNOSIS — Z87891 Personal history of nicotine dependence: Secondary | ICD-10-CM | POA: Diagnosis not present

## 2017-04-29 DIAGNOSIS — I10 Essential (primary) hypertension: Secondary | ICD-10-CM | POA: Insufficient documentation

## 2017-04-29 DIAGNOSIS — H25811 Combined forms of age-related cataract, right eye: Secondary | ICD-10-CM | POA: Diagnosis not present

## 2017-04-29 HISTORY — PX: CATARACT EXTRACTION W/PHACO: SHX586

## 2017-04-29 SURGERY — PHACOEMULSIFICATION, CATARACT, WITH IOL INSERTION
Anesthesia: Monitor Anesthesia Care | Laterality: Right | Wound class: "Clean "

## 2017-04-29 MED ORDER — BSS IO SOLN
INTRAOCULAR | Status: DC | PRN
  Administered 2017-04-29: 102 mL via OPHTHALMIC

## 2017-04-29 MED ORDER — SODIUM HYALURONATE 10 MG/ML IO SOLN
INTRAOCULAR | Status: DC | PRN
  Administered 2017-04-29: 0.55 mL via INTRAOCULAR

## 2017-04-29 MED ORDER — FENTANYL CITRATE (PF) 100 MCG/2ML IJ SOLN: 25.0000 ug | INTRAMUSCULAR | Status: DC | PRN

## 2017-04-29 MED ORDER — MOXIFLOXACIN HCL 0.5 % OP SOLN
OPHTHALMIC | Status: DC | PRN
  Administered 2017-04-29: 0.2 mL via OPHTHALMIC

## 2017-04-29 MED ORDER — LIDOCAINE HCL (PF) 2 % IJ SOLN
INTRAOCULAR | Status: DC | PRN
  Administered 2017-04-29: 1 mL via INTRAOCULAR

## 2017-04-29 MED ORDER — SODIUM HYALURONATE 23 MG/ML IO SOLN
INTRAOCULAR | Status: DC | PRN
  Administered 2017-04-29: 0.6 mL via INTRAOCULAR

## 2017-04-29 MED ORDER — MEPERIDINE HCL 25 MG/ML IJ SOLN: 6.2500 mg | INTRAMUSCULAR | Status: DC | PRN

## 2017-04-29 MED ORDER — FENTANYL CITRATE (PF) 100 MCG/2ML IJ SOLN
INTRAMUSCULAR | Status: DC | PRN
  Administered 2017-04-29: 50 ug via INTRAVENOUS

## 2017-04-29 MED ORDER — OXYCODONE HCL 5 MG PO TABS: 5.0000 mg | ORAL_TABLET | Freq: Once | ORAL | Status: DC | PRN

## 2017-04-29 MED ORDER — OXYCODONE HCL 5 MG/5ML PO SOLN: 5.0000 mg | Freq: Once | ORAL | Status: DC | PRN

## 2017-04-29 MED ORDER — PROMETHAZINE HCL 25 MG/ML IJ SOLN: 6.2500 mg | INTRAMUSCULAR | Status: DC | PRN

## 2017-04-29 MED ORDER — MIDAZOLAM HCL 2 MG/2ML IJ SOLN
INTRAMUSCULAR | Status: DC | PRN
  Administered 2017-04-29: 2 mg via INTRAVENOUS

## 2017-04-29 MED ORDER — ARMC OPHTHALMIC DILATING DROPS
1.0000 "application " | OPHTHALMIC | Status: DC | PRN
  Administered 2017-04-29 (×2): 1 via OPHTHALMIC

## 2017-04-29 MED ORDER — LACTATED RINGERS IV SOLN: 10.0000 mL/h | INTRAVENOUS | Status: DC

## 2017-04-29 SURGICAL SUPPLY — 17 items

## 2017-04-29 NOTE — Transfer of Care (Signed)
Immediate Anesthesia Transfer of Care Note  Patient: Emily Boyer  Procedure(s) Performed: CATARACT EXTRACTION PHACO AND INTRAOCULAR LENS PLACEMENT (IOC) RIGHT (Right )  Patient Location: PACU  Anesthesia Type: MAC  Level of Consciousness: awake, alert  and patient cooperative  Airway and Oxygen Therapy: Patient Spontanous Breathing and Patient connected to supplemental oxygen  Post-op Assessment: Post-op Vital signs reviewed, Patient's Cardiovascular Status Stable, Respiratory Function Stable, Patent Airway and No signs of Nausea or vomiting  Post-op Vital Signs: Reviewed and stable  Complications: No apparent anesthesia complications

## 2017-04-29 NOTE — Anesthesia Procedure Notes (Signed)
Procedure Name: MAC Date/Time: 04/29/2017 9:11 AM Performed by: Janna Arch, CRNA Pre-anesthesia Checklist: Patient identified, Emergency Drugs available, Suction available and Patient being monitored Patient Re-evaluated:Patient Re-evaluated prior to induction Oxygen Delivery Method: Nasal cannula

## 2017-04-29 NOTE — Anesthesia Preprocedure Evaluation (Signed)
Anesthesia Evaluation  Patient identified by MRN, date of birth, ID band Patient awake    Reviewed: Allergy & Precautions, H&P , NPO status , Patient's Chart, lab work & pertinent test results, reviewed documented beta blocker date and time   Airway Mallampati: II  TM Distance: >3 FB Neck ROM: full    Dental no notable dental hx.    Pulmonary neg pulmonary ROS, former smoker,    Pulmonary exam normal breath sounds clear to auscultation       Cardiovascular Exercise Tolerance: Good hypertension, + Valvular Problems/Murmurs  Rhythm:regular Rate:Normal     Neuro/Psych negative neurological ROS  negative psych ROS   GI/Hepatic Neg liver ROS, GERD  ,  Endo/Other  negative endocrine ROS  Renal/GU negative Renal ROS  negative genitourinary   Musculoskeletal   Abdominal   Peds  Hematology negative hematology ROS (+)   Anesthesia Other Findings   Reproductive/Obstetrics negative OB ROS                             Anesthesia Physical Anesthesia Plan  ASA: II  Anesthesia Plan: MAC   Post-op Pain Management:    Induction:   PONV Risk Score and Plan:   Airway Management Planned:   Additional Equipment:   Intra-op Plan:   Post-operative Plan:   Informed Consent: I have reviewed the patients History and Physical, chart, labs and discussed the procedure including the risks, benefits and alternatives for the proposed anesthesia with the patient or authorized representative who has indicated his/her understanding and acceptance.   Dental Advisory Given  Plan Discussed with: CRNA  Anesthesia Plan Comments:         Anesthesia Quick Evaluation

## 2017-04-29 NOTE — Op Note (Signed)
OPERATIVE NOTE  Emily Boyer [id number] 04/29/2017   PREOPERATIVE DIAGNOSIS:  Nuclear sclerotic cataract right eye.  H25.11   POSTOPERATIVE DIAGNOSIS:    Nuclear sclerotic cataract right eye.     PROCEDURE:  Phacoemusification with posterior chamber intraocular lens placement of the right eye   LENS:   Implant Name Type Inv. Item Serial No. Manufacturer Lot No. LRB No. Used  LENS IOL DIOP 22.5 - [id number] Intraocular Lens LENS IOL DIOP 22.5 [id number]  Right 1       PCB00 +22.5   ULTRASOUND TIME: 0 minutes 35.7 seconds.  CDE 3.66   SURGEON:  Benay Pillow, MD, MPH  ANESTHESIOLOGIST: Anesthesiologist: Marice Potter, MD CRNA: Janna Arch, CRNA   ANESTHESIA:  Topical with tetracaine drops augmented with 1% preservative-free intracameral lidocaine.  ESTIMATED BLOOD LOSS: less than 1 mL.   COMPLICATIONS:  None.   DESCRIPTION OF PROCEDURE:  The patient was identified in the holding room and transported to the operating room and placed in the supine position under the operating microscope.  The right eye was identified as the operative eye and it was prepped and draped in the usual sterile ophthalmic fashion.   A 1.0 millimeter clear-corneal paracentesis was made at the 10:30 position. 0.5 ml of preservative-free 1% lidocaine with epinephrine was injected into the anterior chamber.  The anterior chamber was filled with Healon 5 viscoelastic.  A 2.4 millimeter keratome was used to make a near-clear corneal incision at the 8:00 position.  A curvilinear capsulorrhexis was made with a cystotome and capsulorrhexis forceps.  Balanced salt solution was used to hydrodissect and hydrodelineate the nucleus.   Phacoemulsification was then used in stop and chop fashion to remove the lens nucleus and epinucleus.  The remaining cortex was then removed using the irrigation and aspiration handpiece. Healon was then placed into the capsular bag to distend it for lens placement.   A lens was then injected into the capsular bag.  The remaining viscoelastic was aspirated.   Wounds were hydrated with balanced salt solution.  The anterior chamber was inflated to a physiologic pressure with balanced salt solution.   Intracameral vigamox 0.1 mL undiluted was injected into the eye and a drop placed onto the ocular surface.  No wound leaks were noted.  The patient was taken to the recovery room in stable condition without complications of anesthesia or surgery  Benay Pillow 04/29/2017, 9:33 AM

## 2017-04-29 NOTE — Anesthesia Postprocedure Evaluation (Signed)
Anesthesia Post Note  Patient: Emily Boyer  Procedure(s) Performed: CATARACT EXTRACTION PHACO AND INTRAOCULAR LENS PLACEMENT (IOC) RIGHT (Right )  Patient location during evaluation: PACU Anesthesia Type: MAC Level of consciousness: awake and alert Pain management: pain level controlled Vital Signs Assessment: post-procedure vital signs reviewed and stable Respiratory status: spontaneous breathing, nonlabored ventilation, respiratory function stable and patient connected to nasal cannula oxygen Cardiovascular status: blood pressure returned to baseline and stable Postop Assessment: no apparent nausea or vomiting Anesthetic complications: no    Kimberl Vig ELAINE

## 2017-04-29 NOTE — H&P (Signed)
The History and Physical notes are on paper, have been signed, and are to be scanned.   I have examined the patient and there are no changes to the H&P.   Benay Pillow 04/29/2017 9:02 AM

## 2017-04-30 ENCOUNTER — Encounter: Payer: Self-pay | Admitting: Ophthalmology

## 2017-07-02 DIAGNOSIS — H2512 Age-related nuclear cataract, left eye: Secondary | ICD-10-CM | POA: Diagnosis not present

## 2017-07-03 DIAGNOSIS — R69 Illness, unspecified: Secondary | ICD-10-CM | POA: Diagnosis not present

## 2017-07-04 ENCOUNTER — Other Ambulatory Visit: Payer: Self-pay | Admitting: Family Medicine

## 2017-07-04 DIAGNOSIS — I10 Essential (primary) hypertension: Secondary | ICD-10-CM

## 2017-07-09 NOTE — Discharge Instructions (Signed)

## 2017-07-15 ENCOUNTER — Encounter
Admission: RE | Disposition: A | Discharge status: Home / Self Care | Payer: Self-pay | Source: Ambulatory Visit | Attending: Ophthalmology

## 2017-07-15 ENCOUNTER — Ambulatory Visit: Payer: Medicare HMO | Admitting: Anesthesiology

## 2017-07-15 ENCOUNTER — Ambulatory Visit
Admission: RE | Admit: 2017-07-15 | Discharge: 2017-07-15 | Disposition: A | Discharge status: Home / Self Care | Payer: Medicare HMO | Source: Ambulatory Visit | Attending: Ophthalmology | Admitting: Ophthalmology

## 2017-07-15 DIAGNOSIS — Z87891 Personal history of nicotine dependence: Secondary | ICD-10-CM | POA: Diagnosis not present

## 2017-07-15 DIAGNOSIS — I10 Essential (primary) hypertension: Secondary | ICD-10-CM | POA: Insufficient documentation

## 2017-07-15 DIAGNOSIS — H25812 Combined forms of age-related cataract, left eye: Secondary | ICD-10-CM | POA: Diagnosis not present

## 2017-07-15 DIAGNOSIS — H2512 Age-related nuclear cataract, left eye: Secondary | ICD-10-CM | POA: Diagnosis not present

## 2017-07-15 DIAGNOSIS — Z79899 Other long term (current) drug therapy: Secondary | ICD-10-CM | POA: Insufficient documentation

## 2017-07-15 DIAGNOSIS — K219 Gastro-esophageal reflux disease without esophagitis: Secondary | ICD-10-CM | POA: Insufficient documentation

## 2017-07-15 HISTORY — DX: Other seasonal allergic rhinitis: J30.2

## 2017-07-15 HISTORY — PX: CATARACT EXTRACTION W/PHACO: SHX586

## 2017-07-15 SURGERY — PHACOEMULSIFICATION, CATARACT, WITH IOL INSERTION
Anesthesia: Monitor Anesthesia Care | Site: Eye | Laterality: Left | Wound class: "Clean "

## 2017-07-15 MED ORDER — SODIUM HYALURONATE 10 MG/ML IO SOLN
INTRAOCULAR | Status: DC | PRN
  Administered 2017-07-15: 0.55 mL via INTRAOCULAR

## 2017-07-15 MED ORDER — MIDAZOLAM HCL 2 MG/2ML IJ SOLN
INTRAMUSCULAR | Status: DC | PRN
  Administered 2017-07-15 (×2): 1 mg via INTRAVENOUS

## 2017-07-15 MED ORDER — FENTANYL CITRATE (PF) 100 MCG/2ML IJ SOLN
INTRAMUSCULAR | Status: DC | PRN
  Administered 2017-07-15: 50 ug via INTRAVENOUS

## 2017-07-15 MED ORDER — ARMC OPHTHALMIC DILATING DROPS
1.0000 "application " | OPHTHALMIC | Status: DC | PRN
  Administered 2017-07-15 (×2): 1 via OPHTHALMIC

## 2017-07-15 MED ORDER — ONDANSETRON HCL 4 MG/2ML IJ SOLN: 4.0000 mg | Freq: Once | INTRAMUSCULAR | Status: DC | PRN

## 2017-07-15 MED ORDER — SODIUM HYALURONATE 23 MG/ML IO SOLN
INTRAOCULAR | Status: DC | PRN
  Administered 2017-07-15: 0.6 mL via INTRAOCULAR

## 2017-07-15 MED ORDER — MOXIFLOXACIN HCL 0.5 % OP SOLN
OPHTHALMIC | Status: DC | PRN
  Administered 2017-07-15: 1 [drp] via OPHTHALMIC

## 2017-07-15 MED ORDER — LACTATED RINGERS IV SOLN: 10.0000 mL/h | INTRAVENOUS | Status: DC

## 2017-07-15 MED ORDER — EPINEPHRINE PF 1 MG/ML IJ SOLN
INTRAOCULAR | Status: DC | PRN
  Administered 2017-07-15: 104 mL via OPHTHALMIC

## 2017-07-15 MED ORDER — LIDOCAINE HCL (PF) 2 % IJ SOLN
INTRAMUSCULAR | Status: DC | PRN
  Administered 2017-07-15: 1 mL via INTRAOCULAR

## 2017-07-15 SURGICAL SUPPLY — 17 items

## 2017-07-15 NOTE — Transfer of Care (Signed)
Immediate Anesthesia Transfer of Care Note  Patient: Emily Boyer  Procedure(s) Performed: CATARACT EXTRACTION PHACO AND INTRAOCULAR LENS PLACEMENT (IOC) LEFT (Left Eye)  Patient Location: PACU  Anesthesia Type: MAC  Level of Consciousness: awake, alert  and patient cooperative  Airway and Oxygen Therapy: Patient Spontanous Breathing and Patient connected to supplemental oxygen  Post-op Assessment: Post-op Vital signs reviewed, Patient's Cardiovascular Status Stable, Respiratory Function Stable, Patent Airway and No signs of Nausea or vomiting  Post-op Vital Signs: Reviewed and stable  Complications: No apparent anesthesia complications

## 2017-07-15 NOTE — Anesthesia Preprocedure Evaluation (Signed)
Anesthesia Evaluation  Patient identified by MRN, date of birth, ID band Patient awake    Reviewed: Allergy & Precautions, H&P , NPO status , Patient's Chart, lab work & pertinent test results, reviewed documented beta blocker date and time   Airway Mallampati: II  TM Distance: >3 FB Neck ROM: full    Dental no notable dental hx.    Pulmonary neg pulmonary ROS, former smoker,    breath sounds clear to auscultation       Cardiovascular Exercise Tolerance: Good hypertension, + Valvular Problems/Murmurs  Rhythm:regular Rate:Normal     Neuro/Psych negative neurological ROS  negative psych ROS   GI/Hepatic Neg liver ROS, GERD  Medicated,  Endo/Other  negative endocrine ROS  Renal/GU negative Renal ROS  negative genitourinary   Musculoskeletal   Abdominal   Peds  Hematology negative hematology ROS (+)   Anesthesia Other Findings   Reproductive/Obstetrics negative OB ROS                             Anesthesia Physical  Anesthesia Plan  ASA: II  Anesthesia Plan: MAC   Post-op Pain Management:    Induction:   PONV Risk Score and Plan:   Airway Management Planned:   Additional Equipment:   Intra-op Plan:   Post-operative Plan:   Informed Consent: I have reviewed the patients History and Physical, chart, labs and discussed the procedure including the risks, benefits and alternatives for the proposed anesthesia with the patient or authorized representative who has indicated his/her understanding and acceptance.   Dental Advisory Given  Plan Discussed with: CRNA  Anesthesia Plan Comments:         Anesthesia Quick Evaluation

## 2017-07-15 NOTE — H&P (Signed)
The History and Physical notes are on paper, have been signed, and are to be scanned.   I have examined the patient and there are no changes to the H&P.   Emily Boyer 07/15/2017 11:16 AM

## 2017-07-15 NOTE — Anesthesia Procedure Notes (Signed)
Procedure Name: [place] Performed by: Cameron Ali, CRNA Pre-anesthesia Checklist: Patient identified, Emergency Drugs available, Suction available, Timeout performed and Patient being monitored Patient Re-evaluated:Patient Re-evaluated prior to induction Oxygen Delivery Method: Nasal cannula Placement Confirmation: positive ETCO2

## 2017-07-15 NOTE — Op Note (Signed)
OPERATIVE NOTE  Emily Boyer [id number] 07/15/2017   PREOPERATIVE DIAGNOSIS:  Nuclear sclerotic cataract left eye.  H25.12   POSTOPERATIVE DIAGNOSIS:    Nuclear sclerotic cataract left eye.     PROCEDURE:  Phacoemusification with posterior chamber intraocular lens placement of the left eye   LENS:   Implant Name Type Inv. Item Serial No. Manufacturer Lot No. LRB No. Used  LENS IOL DIOP 23.0 - [id number] Intraocular Lens LENS IOL DIOP 23.0 [id number] AMO  Left 1       PCB00 +23.0   ULTRASOUND TIME: 0 minutes 37 seconds.  CDE 3.99   SURGEON:  Benay Pillow, MD, MPH   ANESTHESIA:  Topical with tetracaine drops augmented with 1% preservative-free intracameral lidocaine.  ESTIMATED BLOOD LOSS: <1 mL   COMPLICATIONS:  None.   DESCRIPTION OF PROCEDURE:  The patient was identified in the holding room and transported to the operating room and placed in the supine position under the operating microscope.  The left eye was identified as the operative eye and it was prepped and draped in the usual sterile ophthalmic fashion.   A 1.0 millimeter clear-corneal paracentesis was made at the 5:00 position. 0.5 ml of preservative-free 1% lidocaine with epinephrine was injected into the anterior chamber.  The anterior chamber was filled with Healon 5 viscoelastic.  A 2.4 millimeter keratome was used to make a near-clear corneal incision at the 2:00 position.  A curvilinear capsulorrhexis was made with a cystotome and capsulorrhexis forceps.  Balanced salt solution was used to hydrodissect and hydrodelineate the nucleus.   Phacoemulsification was then used in stop and chop fashion to remove the lens nucleus and epinucleus.  The remaining cortex was then removed using the irrigation and aspiration handpiece. Healon was then placed into the capsular bag to distend it for lens placement.  A lens was then injected into the capsular bag.  The remaining viscoelastic was aspirated.   Wounds were hydrated  with balanced salt solution.  The anterior chamber was inflated to a physiologic pressure with balanced salt solution.  Intracameral vigamox 0.1 mL undiltued was injected into the eye and a drop placed onto the ocular surface.  No wound leaks were noted.  The patient was taken to the recovery room in stable condition without complications of anesthesia or surgery  Benay Pillow 07/15/2017, 11:45 AM

## 2017-07-15 NOTE — Anesthesia Postprocedure Evaluation (Signed)
Anesthesia Post Note  Patient: Emily Boyer  Procedure(s) Performed: CATARACT EXTRACTION PHACO AND INTRAOCULAR LENS PLACEMENT (IOC) LEFT (Left Eye)  Patient location during evaluation: PACU Anesthesia Type: MAC Level of consciousness: awake and alert Pain management: pain level controlled Vital Signs Assessment: post-procedure vital signs reviewed and stable Respiratory status: spontaneous breathing, nonlabored ventilation, respiratory function stable and patient connected to nasal cannula oxygen Cardiovascular status: stable and blood pressure returned to baseline Postop Assessment: no apparent nausea or vomiting Anesthetic complications: no    Veda Canning

## 2017-07-16 ENCOUNTER — Encounter: Payer: Self-pay | Admitting: Ophthalmology

## 2017-07-22 ENCOUNTER — Ambulatory Visit (INDEPENDENT_AMBULATORY_CARE_PROVIDER_SITE_OTHER): Payer: Medicare HMO | Admitting: Family Medicine

## 2017-07-22 ENCOUNTER — Encounter: Payer: Self-pay | Admitting: Family Medicine

## 2017-07-22 ENCOUNTER — Ambulatory Visit: Payer: Medicare HMO | Admitting: Family Medicine

## 2017-07-22 DIAGNOSIS — K219 Gastro-esophageal reflux disease without esophagitis: Secondary | ICD-10-CM | POA: Diagnosis not present

## 2017-07-22 DIAGNOSIS — I10 Essential (primary) hypertension: Secondary | ICD-10-CM

## 2017-07-22 MED ORDER — PANTOPRAZOLE SODIUM 40 MG PO TBEC: 40.0000 mg | DELAYED_RELEASE_TABLET | Freq: Every day | ORAL | 1 refills | Status: DC

## 2017-07-22 MED ORDER — HYDROCHLOROTHIAZIDE 25 MG PO TABS: 25.0000 mg | ORAL_TABLET | Freq: Every day | ORAL | 1 refills | Status: DC

## 2017-07-22 NOTE — Assessment & Plan Note (Signed)
Continue HCTZ- stable on meds

## 2017-07-22 NOTE — Progress Notes (Signed)
Name: Emily Boyer   MRN: [id number]    DOB: 03-10-1945   Date:07/22/2017       Progress Note  Subjective  Chief Complaint  Chief Complaint  Patient presents with  . Hypertension  . Gastroesophageal Reflux    Hypertension  This is a chronic problem. The current episode started more than 1 year ago. The problem has been gradually improving since onset. The problem is controlled. Pertinent negatives include no anxiety, blurred vision, chest pain, headaches, malaise/fatigue, neck pain, orthopnea, palpitations, peripheral edema, PND, shortness of breath or sweats. There are no associated agents to hypertension. There are no known risk factors for coronary artery disease. Past treatments include diuretics. The current treatment provides moderate improvement. There are no compliance problems.  There is no history of angina, kidney disease, CAD/MI, CVA, heart failure, left ventricular hypertrophy, PVD or retinopathy. There is no history of chronic renal disease, a hypertension causing med or renovascular disease.  Gastroesophageal Reflux  She reports no abdominal pain, no belching, no chest pain, no choking, no coughing, no dysphagia, no early satiety, no globus sensation, no heartburn, no hoarse voice, no nausea, no sore throat, no stridor, no tooth decay, no water brash or no wheezing. This is a chronic problem. The problem has been gradually improving. The symptoms are aggravated by certain foods. Pertinent negatives include no anemia, fatigue, melena, muscle weakness, orthopnea or weight loss. She has tried a PPI for the symptoms. The treatment provided moderate relief. Past procedures do not include an abdominal ultrasound, an EGD, esophageal manometry, esophageal pH monitoring, H. pylori antibody titer or a UGI.    Hypertension Continue HCTZ- stable on meds  Gastroesophageal reflux disease Continue pantoprazole- stable on meds   Past Medical History:  Diagnosis Date  . Bone spur    right side of neck  . Colitis   . Diverticulitis   . GERD (gastroesophageal reflux disease)   . Heart murmur    followed by PCP  . Hypertension   . Seasonal allergies     Past Surgical History:  Procedure Laterality Date  . BREAST BIOPSY Right 2004   neg  . CATARACT EXTRACTION W/PHACO Right 04/29/2017   Procedure: CATARACT EXTRACTION PHACO AND INTRAOCULAR LENS PLACEMENT (IOC) RIGHT;  Surgeon: Eulogio Bear, MD;  Location: [place];  Service: Ophthalmology;  Laterality: Right;  . CATARACT EXTRACTION W/PHACO Left 07/15/2017   Procedure: CATARACT EXTRACTION PHACO AND INTRAOCULAR LENS PLACEMENT ([place]) LEFT;  Surgeon: Eulogio Bear, MD;  Location: [place];  Service: Ophthalmology;  Laterality: Left;  . COLONOSCOPY WITH PROPOFOL N/A 02/06/2015   Procedure: COLONOSCOPY WITH PROPOFOL;  Surgeon: [name], MD;  Location: [place];  Service: Endoscopy;  Laterality: N/A;  . TUBAL LIGATION  1980s  . VAGINAL HYSTERECTOMY  1990s   one ovary removed as well    Family History  Problem Relation Age of Onset  . Stroke Mother   . Arthritis Father   . Heart disease Paternal Aunt   . Cancer Paternal Grandmother   . Stomach cancer Paternal Grandmother   . Breast cancer Cousin        pat cousin    Social History   Socioeconomic History  . Marital status: Married    Spouse name: Not on file  . Number of children: Not on file  . Years of education: Not on file  . Highest education level: Not on file  Occupational History  . Not on file  Social Needs  .  Financial resource strain: Not on file  . Food insecurity:    Worry: Not on file    Inability: Not on file  . Transportation needs:    Medical: Not on file    Non-medical: Not on file  Tobacco Use  . Smoking status: Former Smoker    Packs/day: 0.50    Years: 50.00    Pack years: 25.00    Last attempt to quit: 11/22/2011    Years since quitting: 5.6  . Smokeless tobacco: Never Used  . Tobacco  comment: quit 2014  Substance and Sexual Activity  . Alcohol use: Yes    Alcohol/week: 1.2 oz    Types: 2 Glasses of wine per week  . Drug use: No  . Sexual activity: Yes    Birth control/protection: None  Lifestyle  . Physical activity:    Days per week: Not on file    Minutes per session: Not on file  . Stress: Not on file  Relationships  . Social connections:    Talks on phone: Not on file    Gets together: Not on file    Attends religious service: Not on file    Active member of club or organization: Not on file    Attends meetings of clubs or organizations: Not on file    Relationship status: Not on file  . Intimate partner violence:    Fear of current or ex partner: Not on file    Emotionally abused: Not on file    Physically abused: Not on file    Forced sexual activity: Not on file  Other Topics Concern  . Not on file  Social History Narrative  . Not on file    Allergies  Allergen Reactions  . Guaifenesin & Derivatives   . Methocarbamol Itching  . Penicillins Itching  . Tramadol Hcl Itching    Outpatient Medications Prior to Visit  Medication Sig Dispense Refill  . aspirin EC 81 MG tablet Take by mouth.    . Cod Liver Oil OIL Take 1 tablet by mouth.    . hydrochlorothiazide (HYDRODIURIL) 25 MG tablet TAKE 1 TABLET BY MOUTH ONCE DAILY 90 tablet 0  . pantoprazole (PROTONIX) 40 MG tablet Take 1 tablet (40 mg total) daily by mouth. 90 tablet 1  . liver oil-zinc oxide (DESITIN) 40 % ointment Apply 1 application topically as needed for irritation.     No facility-administered medications prior to visit.     Review of Systems  Constitutional: Negative for chills, fatigue, fever, malaise/fatigue and weight loss.  HENT: Negative for ear discharge, ear pain, hoarse voice and sore throat.   Eyes: Negative for blurred vision.  Respiratory: Negative for cough, sputum production, choking, shortness of breath and wheezing.   Cardiovascular: Negative for chest pain,  palpitations, orthopnea, leg swelling and PND.  Gastrointestinal: Negative for abdominal pain, blood in stool, constipation, diarrhea, dysphagia, heartburn, melena and nausea.  Genitourinary: Negative for dysuria, frequency, hematuria and urgency.  Musculoskeletal: Negative for back pain, joint pain, myalgias, muscle weakness and neck pain.  Skin: Negative for rash.  Neurological: Negative for dizziness, tingling, sensory change, focal weakness and headaches.  Endo/Heme/Allergies: Negative for environmental allergies and polydipsia. Does not bruise/bleed easily.  Psychiatric/Behavioral: Negative for depression and suicidal ideas. The patient is not nervous/anxious and does not have insomnia.      Objective  Vitals:   07/22/17 1410  BP: 130/80  Pulse: 64  Weight: 153 lb (69.4 kg)  Height: 5\' 1"  (1.549 m)  Physical Exam  Constitutional: She is oriented to person, place, and time. She appears well-developed and well-nourished.  HENT:  Head: Normocephalic.  Right Ear: External ear normal.  Left Ear: External ear normal.  Mouth/Throat: Oropharynx is clear and moist.  Eyes: Pupils are equal, round, and reactive to light. Conjunctivae and EOM are normal. Lids are everted and swept, no foreign bodies found. Left eye exhibits no hordeolum. No foreign body present in the left eye. Right conjunctiva is not injected. Left conjunctiva is not injected. No scleral icterus.  Neck: Normal range of motion. Neck supple. No JVD present. No tracheal deviation present. No thyromegaly present.  Cardiovascular: Normal rate, regular rhythm, normal heart sounds and intact distal pulses. Exam reveals no gallop and no friction rub.  No murmur heard. Pulmonary/Chest: Effort normal and breath sounds normal. No respiratory distress. She has no wheezes. She has no rales.  Abdominal: Soft. Bowel sounds are normal. She exhibits no mass. There is no hepatosplenomegaly. There is no tenderness. There is no rebound and  no guarding.  Musculoskeletal: Normal range of motion. She exhibits no edema or tenderness.  Lymphadenopathy:    She has no cervical adenopathy.  Neurological: She is alert and oriented to person, place, and time. She has normal strength. She displays normal reflexes. No cranial nerve deficit.  Skin: Skin is warm. No rash noted.  Psychiatric: She has a normal mood and affect. Her mood appears not anxious. She does not exhibit a depressed mood.  Nursing note and vitals reviewed.     Assessment & Plan  Problem List Items Addressed This Visit      Cardiovascular and Mediastinum   Hypertension    Continue HCTZ- stable on meds      Relevant Medications   hydrochlorothiazide (HYDRODIURIL) 25 MG tablet     Digestive   Gastroesophageal reflux disease    Continue pantoprazole- stable on meds      Relevant Medications   pantoprazole (PROTONIX) 40 MG tablet      Meds ordered this encounter  Medications  . hydrochlorothiazide (HYDRODIURIL) 25 MG tablet    Sig: Take 1 tablet (25 mg total) by mouth daily.    Dispense:  90 tablet    Refill:  1  . pantoprazole (PROTONIX) 40 MG tablet    Sig: Take 1 tablet (40 mg total) by mouth daily.    Dispense:  90 tablet    Refill:  1      Dr. Otilio Miu [facility]  07/22/17

## 2017-07-22 NOTE — Assessment & Plan Note (Signed)
Continue pantoprazole- stable on meds

## 2017-09-20 ENCOUNTER — Ambulatory Visit
Admission: EM | Admit: 2017-09-20 | Discharge: 2017-09-20 | Disposition: A | Discharge status: Home / Self Care | Payer: Medicare HMO | Attending: Family Medicine | Admitting: Family Medicine

## 2017-09-20 DIAGNOSIS — M7072 Other bursitis of hip, left hip: Secondary | ICD-10-CM

## 2017-09-20 DIAGNOSIS — M7070 Other bursitis of hip, unspecified hip: Secondary | ICD-10-CM

## 2017-09-20 MED ORDER — PREDNISONE 20 MG PO TABS: ORAL_TABLET | ORAL | 0 refills | Status: DC

## 2017-09-20 MED ORDER — HYDROCODONE-ACETAMINOPHEN 5-325 MG PO TABS: ORAL_TABLET | ORAL | 0 refills | Status: DC

## 2017-09-20 NOTE — ED Triage Notes (Signed)
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 Sharp shooting pain that comes and goes and unable to bear weight on it without pain. No injuries reported. Did take aleve but was unable to sleep with the pain. Pt states she did look at her leg and it wasn't red, swollen or hot to the touch.

## 2017-09-20 NOTE — ED Provider Notes (Signed)
[facility]    CSN: [id number] Arrival date & time: 09/20/17  0807     History   Chief Complaint Chief Complaint  Patient presents with  . Leg Pain    HPI Emily Boyer is a 72 y.o. female.   72 yo female with a c/o 1 day h/o sharp shooting pain to left upper lateral thigh area. Denies any injuries, falls, redness, swelling. 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 Denies any fevers, prolonged immobilization, recent surgeries.   The history is provided by the patient.    Past Medical History:  Diagnosis Date  . Bone spur    right side of neck  . Colitis   . Diverticulitis   . GERD (gastroesophageal reflux disease)   . Heart murmur    followed by PCP  . Hypertension   . Seasonal allergies     Patient Active Problem List   Diagnosis Date Noted  . Sebaceous cyst 12/20/2015  . Abnormal findings-gastrointestinal tract   . Encounter for general adult medical examination without abnormal findings 07/04/2014  . Nerve root pain 07/04/2014  . Gastroesophageal reflux disease 07/04/2014  . HLD (hyperlipidemia) 07/04/2014  . Hypertension 07/04/2014    Past Surgical History:  Procedure Laterality Date  . BREAST BIOPSY Right 2004   neg  . CATARACT EXTRACTION W/PHACO Right 04/29/2017   Procedure: CATARACT EXTRACTION PHACO AND INTRAOCULAR LENS PLACEMENT (IOC) RIGHT;  Surgeon: Eulogio Bear, MD;  Location: [place];  Service: Ophthalmology;  Laterality: Right;  . CATARACT EXTRACTION W/PHACO Left 07/15/2017   Procedure: CATARACT EXTRACTION PHACO AND INTRAOCULAR LENS PLACEMENT ([place]) LEFT;  Surgeon: Eulogio Bear, MD;  Location: [place];  Service: Ophthalmology;  Laterality: Left;  . COLONOSCOPY WITH PROPOFOL N/A 02/06/2015   Procedure: COLONOSCOPY WITH PROPOFOL;  Surgeon: [name], MD;  Location: [place];  Service: Endoscopy;   Laterality: N/A;  . TUBAL LIGATION  1980s  . VAGINAL HYSTERECTOMY  1990s   one ovary removed as well    OB History   None      Home Medications    Prior to Admission medications   Medication Sig Start Date End Date Taking? Authorizing Provider  aspirin EC 81 MG tablet Take by mouth.   Yes [provider]  cholecalciferol (VITAMIN D) 1000 units tablet Take 1,000 Units by mouth daily.   Yes [provider]  hydrochlorothiazide (HYDRODIURIL) 25 MG tablet Take 1 tablet (25 mg total) by mouth daily. 07/22/17  Yes Juline Patch, MD  pantoprazole (PROTONIX) 40 MG tablet Take 1 tablet (40 mg total) by mouth daily. 07/22/17  Yes Juline Patch, MD  Cod Liver Oil OIL Take 1 tablet by mouth.    [provider]  HYDROcodone-acetaminophen (NORCO/VICODIN) 5-325 MG tablet 1-2 tabs po bid prn severe pain 09/20/17   Norval Gable, MD  predniSONE (DELTASONE) 20 MG tablet 3 tabs po qd x 2 days, then 2 tabs po qd x 2 days, then 1 tab po qd x 2 days, then half a tab po qd x 2 days 09/20/17   Norval Gable, MD    Family History Family History  Problem Relation Age of Onset  . Stroke Mother   . Arthritis Father   . Heart disease Paternal Aunt   . Cancer Paternal Grandmother   . Stomach cancer Paternal Grandmother   . Breast cancer Cousin  pat cousin    Social History Social History   Tobacco Use  . Smoking status: Former Smoker    Packs/day: 0.50    Years: 50.00    Pack years: 25.00    Last attempt to quit: 11/22/2011    Years since quitting: 5.8  . Smokeless tobacco: Never Used  . Tobacco comment: quit 2014  Substance Use Topics  . Alcohol use: Yes    Alcohol/week: 2.0 standard drinks    Types: 2 Glasses of wine per week  . Drug use: No     Allergies   Guaifenesin & derivatives; Methocarbamol; Penicillins; and Tramadol hcl   Review of Systems Review of Systems   Physical Exam Triage Vital Signs ED Triage Vitals  Enc Vitals Group     BP  09/20/17 0820 (!) 182/84     Pulse Rate 09/20/17 0818 63     Resp 09/20/17 0818 18     Temp 09/20/17 0818 98.3 F (36.8 C)     Temp Source 09/20/17 0818 Oral     SpO2 09/20/17 0818 97 %     Weight 09/20/17 0822 156 lb (70.8 kg)     Height --      Head Circumference --      Peak Flow --      Pain Score 09/20/17 0822 8     Pain Loc --      Pain Edu? --      Excl. in [place]? --    No data found.  Updated Vital Signs BP (!) 182/84   Pulse 63   Temp 98.3 F (36.8 C) (Oral)   Resp 18   Wt 70.8 kg   SpO2 97%   BMI 29.48 kg/m   Visual Acuity Right Eye Distance:   Left Eye Distance:   Bilateral Distance:    Right Eye Near:   Left Eye Near:    Bilateral Near:     Physical Exam  Constitutional: She appears well-developed and well-nourished. No distress.  Musculoskeletal:       Left hip: She exhibits tenderness (left lateral upper thigh). She exhibits normal range of motion, normal strength, no bony tenderness, no swelling, no crepitus, no deformity and no laceration.  Skin: She is not diaphoretic.  Nursing note and vitals reviewed.    UC Treatments / Results  Labs (all labs ordered are listed, but only abnormal results are displayed) Labs Reviewed - No data to display  EKG None  Radiology No results found.  Procedures Procedures (including critical care time)  Medications Ordered in UC Medications - No data to display  Initial Impression / Assessment and Plan / UC Course  I have reviewed the triage vital signs and the nursing notes.  Pertinent labs & imaging results that were available during my care of the patient were reviewed by me and considered in my medical decision making (see chart for details).      Final Clinical Impressions(s) / UC Diagnoses   Final diagnoses:  Bursitis of hip, unspecified bursa, unspecified laterality     Discharge Instructions     Rest, ice to the area    ED Prescriptions    Medication Sig Dispense Auth. Provider    predniSONE (DELTASONE) 20 MG tablet 3 tabs po qd x 2 days, then 2 tabs po qd x 2 days, then 1 tab po qd x 2 days, then half a tab po qd x 2 days 13 tablet Norval Gable, MD   HYDROcodone-acetaminophen (NORCO/VICODIN) 5-325 MG tablet  1-2 tabs po bid prn severe pain 8 tablet Norval Gable, MD     1. diagnosis reviewed with patient 2. rx as per orders above; reviewed possible side effects, interactions, risks and benefits  3. Recommend supportive treatment as above 4. Follow-up prn if symptoms worsen or don't improve    Controlled Substance Prescriptions  Controlled Substance Registry consulted? Not Applicable   Norval Gable, MD 09/20/17 [phone]

## 2017-09-20 NOTE — Discharge Instructions (Addendum)
Rest, ice to the area

## 2017-09-25 ENCOUNTER — Ambulatory Visit
Admission: RE | Admit: 2017-09-25 | Discharge: 2017-09-25 | Disposition: A | Discharge status: Home / Self Care | Payer: Medicare HMO | Source: Ambulatory Visit | Attending: Family Medicine | Admitting: Family Medicine

## 2017-09-25 ENCOUNTER — Encounter: Payer: Self-pay | Admitting: Family Medicine

## 2017-09-25 ENCOUNTER — Ambulatory Visit (INDEPENDENT_AMBULATORY_CARE_PROVIDER_SITE_OTHER): Payer: Medicare HMO | Admitting: Family Medicine

## 2017-09-25 VITALS — BP 130/88 | HR 52 | Ht 61.0 in | Wt 154.0 lb

## 2017-09-25 DIAGNOSIS — M7062 Trochanteric bursitis, left hip: Secondary | ICD-10-CM | POA: Insufficient documentation

## 2017-09-25 DIAGNOSIS — M25552 Pain in left hip: Secondary | ICD-10-CM

## 2017-09-25 DIAGNOSIS — I70202 Unspecified atherosclerosis of native arteries of extremities, left leg: Secondary | ICD-10-CM | POA: Insufficient documentation

## 2017-09-25 MED ORDER — MELOXICAM 15 MG PO TABS
15.0000 mg | ORAL_TABLET | Freq: Every day | ORAL | 0 refills | Status: DC
Start: 2017-09-25 — End: 2017-11-25

## 2017-09-25 NOTE — Progress Notes (Signed)
Name: Emily Boyer   MRN: [id number]    DOB: 03-30-1945   Date:09/25/2017       Progress Note  Subjective  Chief Complaint  Chief Complaint  Patient presents with  . Hip Pain    l0 hip- was seen in urgent care on Sat. Dx- bursitis..feels a little better, but not much    Hip Pain   The incident occurred 5 to 7 days ago. There was no injury mechanism. The pain is present in the left hip. The quality of the pain is described as aching. The pain is at a severity of 5/10. The pain is moderate. The pain has been constant since onset. Associated symptoms include a loss of motion. Pertinent negatives include no inability to bear weight, numbness or tingling. She reports no foreign bodies present. The symptoms are aggravated by movement, palpation and weight bearing. She has tried NSAIDs and acetaminophen for the symptoms. The treatment provided moderate relief.    No problem-specific Assessment & Plan notes found for this encounter.   Past Medical History:  Diagnosis Date  . Bone spur    right side of neck  . Colitis   . Diverticulitis   . GERD (gastroesophageal reflux disease)   . Heart murmur    followed by PCP  . Hypertension   . Seasonal allergies     Past Surgical History:  Procedure Laterality Date  . BREAST BIOPSY Right 2004   neg  . CATARACT EXTRACTION W/PHACO Right 04/29/2017   Procedure: CATARACT EXTRACTION PHACO AND INTRAOCULAR LENS PLACEMENT ([facility]) RIGHT;  Surgeon: Eulogio Bear, MD;  Location: [place];  Service: Ophthalmology;  Laterality: Right;  . CATARACT EXTRACTION W/PHACO Left 07/15/2017   Procedure: CATARACT EXTRACTION PHACO AND INTRAOCULAR LENS PLACEMENT ([place]) LEFT;  Surgeon: Eulogio Bear, MD;  Location: [place];  Service: Ophthalmology;  Laterality: Left;  . COLONOSCOPY WITH PROPOFOL N/A 02/06/2015   Procedure: COLONOSCOPY WITH PROPOFOL;  Surgeon: [name], MD;  Location: [place];  Service: Endoscopy;  Laterality:  N/A;  . TUBAL LIGATION  1980s  . VAGINAL HYSTERECTOMY  1990s   one ovary removed as well    Family History  Problem Relation Age of Onset  . Stroke Mother   . Arthritis Father   . Heart disease Paternal Aunt   . Cancer Paternal Grandmother   . Stomach cancer Paternal Grandmother   . Breast cancer Cousin        pat cousin    Social History   Socioeconomic History  . Marital status: Married    Spouse name: Not on file  . Number of children: Not on file  . Years of education: Not on file  . Highest education level: Not on file  Occupational History  . Not on file  Social Needs  . Financial resource strain: Not on file  . Food insecurity:    Worry: Not on file    Inability: Not on file  . Transportation needs:    Medical: Not on file    Non-medical: Not on file  Tobacco Use  . Smoking status: Former Smoker    Packs/day: 0.50    Years: 50.00    Pack years: 25.00    Last attempt to quit: 11/22/2011    Years since quitting: 5.8  . Smokeless tobacco: Never Used  . Tobacco comment: quit 2014  Substance and Sexual Activity  . Alcohol use: Yes    Alcohol/week: 2.0 standard drinks    Types: 2  Glasses of wine per week  . Drug use: No  . Sexual activity: Yes    Birth control/protection: None  Lifestyle  . Physical activity:    Days per week: Not on file    Minutes per session: Not on file  . Stress: Not on file  Relationships  . Social connections:    Talks on phone: Not on file    Gets together: Not on file    Attends religious service: Not on file    Active member of club or organization: Not on file    Attends meetings of clubs or organizations: Not on file    Relationship status: Not on file  . Intimate partner violence:    Fear of current or ex partner: Not on file    Emotionally abused: Not on file    Physically abused: Not on file    Forced sexual activity: Not on file  Other Topics Concern  . Not on file  Social History Narrative  . Not on file     Allergies  Allergen Reactions  . Guaifenesin & Derivatives   . Methocarbamol Itching  . Penicillins Itching  . Tramadol Hcl Itching    Outpatient Medications Prior to Visit  Medication Sig Dispense Refill  . aspirin EC 81 MG tablet Take by mouth.    . cholecalciferol (VITAMIN D) 1000 units tablet Take 1,000 Units by mouth daily.    . hydrochlorothiazide (HYDRODIURIL) 25 MG tablet Take 1 tablet (25 mg total) by mouth daily. 90 tablet 1  . pantoprazole (PROTONIX) 40 MG tablet Take 1 tablet (40 mg total) by mouth daily. 90 tablet 1  . Cod Liver Oil OIL Take 1 tablet by mouth.    Marland Kitchen HYDROcodone-acetaminophen (NORCO/VICODIN) 5-325 MG tablet 1-2 tabs po bid prn severe pain 8 tablet 0  . predniSONE (DELTASONE) 20 MG tablet 3 tabs po qd x 2 days, then 2 tabs po qd x 2 days, then 1 tab po qd x 2 days, then half a tab po qd x 2 days 13 tablet 0   No facility-administered medications prior to visit.     Review of Systems  Constitutional: Negative for chills, fever, malaise/fatigue and weight loss.  HENT: Negative for ear discharge, ear pain and sore throat.   Eyes: Negative for blurred vision.  Respiratory: Negative for cough, sputum production, shortness of breath and wheezing.   Cardiovascular: Negative for chest pain, palpitations and leg swelling.  Gastrointestinal: Negative for abdominal pain, blood in stool, constipation, diarrhea, heartburn, melena and nausea.  Genitourinary: Negative for dysuria, frequency, hematuria and urgency.  Musculoskeletal: Negative for back pain, joint pain, myalgias and neck pain.  Skin: Negative for rash.  Neurological: Negative for dizziness, tingling, sensory change, focal weakness, numbness and headaches.  Endo/Heme/Allergies: Negative for environmental allergies and polydipsia. Does not bruise/bleed easily.  Psychiatric/Behavioral: Negative for depression and suicidal ideas. The patient is not nervous/anxious and does not have insomnia.       Objective  Vitals:   09/25/17 0816  BP: 130/88  Pulse: (!) 52  Weight: 154 lb (69.9 kg)  Height: 5\' 1"  (1.549 m)    Physical Exam  Constitutional: She is oriented to person, place, and time. She appears well-developed and well-nourished.  HENT:  Head: Normocephalic.  Right Ear: External ear normal.  Left Ear: External ear normal.  Mouth/Throat: Oropharynx is clear and moist.  Eyes: Pupils are equal, round, and reactive to light. Conjunctivae and EOM are normal. Lids are everted and swept, no  foreign bodies found. Left eye exhibits no hordeolum. No foreign body present in the left eye. Right conjunctiva is not injected. Left conjunctiva is not injected. No scleral icterus.  Neck: Normal range of motion. Neck supple. No JVD present. No tracheal deviation present. No thyromegaly present.  Cardiovascular: Normal rate, regular rhythm, S1 normal, S2 normal and intact distal pulses. PMI is not displaced. Exam reveals no gallop, no S3, no S4 and no friction rub.  Murmur heard.  Systolic murmur is present with a grade of 1/6. Pulmonary/Chest: Effort normal and breath sounds normal. No respiratory distress. She has no wheezes. She has no rales.  Abdominal: Soft. Bowel sounds are normal. She exhibits no mass. There is no hepatosplenomegaly. There is no tenderness. There is no rebound and no guarding.  Musculoskeletal: She exhibits no edema or tenderness.       Left hip: She exhibits decreased range of motion and bony tenderness.  Tender left trochanteric  Lymphadenopathy:    She has no cervical adenopathy.  Neurological: She is alert and oriented to person, place, and time. She has normal strength. She displays normal reflexes. No cranial nerve deficit.  Skin: Skin is warm. No rash noted.  Psychiatric: She has a normal mood and affect. Her mood appears not anxious. She does not exhibit a depressed mood.  Nursing note and vitals reviewed.     Assessment & Plan  Problem List Items  Addressed This Visit    None    Visit Diagnoses    Pain of left hip joint    -  Primary   Acute Recurrent Will xray left hip to rule out occult  concern.    Relevant Medications   meloxicam (MOBIC) 15 MG tablet   Other Relevant Orders   Ambulatory referral to Orthopedic Surgery   Trochanteric bursitis of left hip       Persistent tenderness of trochanteric bursa. Will refer to ortho for possible injection of bursa.    Relevant Medications   meloxicam (MOBIC) 15 MG tablet   Other Relevant Orders   DG HIP UNILAT WITH PELVIS 2-3 VIEWS LEFT      Meds ordered this encounter  Medications  . meloxicam (MOBIC) 15 MG tablet    Sig: Take 1 tablet (15 mg total) by mouth daily.    Dispense:  30 tablet    Refill:  0      Dr. Kinsey [facility]  09/25/17

## 2017-11-25 ENCOUNTER — Ambulatory Visit (INDEPENDENT_AMBULATORY_CARE_PROVIDER_SITE_OTHER): Payer: Medicare HMO | Admitting: Family Medicine

## 2017-11-25 ENCOUNTER — Encounter: Payer: Self-pay | Admitting: Family Medicine

## 2017-11-25 VITALS — BP 154/70 | HR 72 | Ht 61.0 in | Wt 147.0 lb

## 2017-11-25 DIAGNOSIS — I10 Essential (primary) hypertension: Secondary | ICD-10-CM | POA: Diagnosis not present

## 2017-11-25 DIAGNOSIS — Z23 Encounter for immunization: Secondary | ICD-10-CM

## 2017-11-25 DIAGNOSIS — Z780 Asymptomatic menopausal state: Secondary | ICD-10-CM

## 2017-11-25 DIAGNOSIS — M7062 Trochanteric bursitis, left hip: Secondary | ICD-10-CM | POA: Diagnosis not present

## 2017-11-25 DIAGNOSIS — M25552 Pain in left hip: Secondary | ICD-10-CM | POA: Diagnosis not present

## 2017-11-25 MED ORDER — MELOXICAM 15 MG PO TABS: 15.0000 mg | ORAL_TABLET | Freq: Every day | ORAL | 3 refills | Status: DC

## 2017-11-25 NOTE — Progress Notes (Signed)
Date:  11/25/2017   Name:  Emily Boyer   DOB:  Sep 20, 1945   MRN:  [id number]   Chief Complaint: Follow-up (L) hip pain- seeing Sherren Mocha [facility]/ wants refill on Meloxicam) and Flu Vaccine Hip Pain   The incident occurred more than 1 week ago. The incident occurred in the yard. There was no injury mechanism. The pain is present in the left leg and left hip. The quality of the pain is described as aching. The pain is moderate. The pain has been intermittent since onset. Pertinent negatives include no numbness. The symptoms are aggravated by movement. She has tried NSAIDs (steroid injection) for the symptoms.     Review of Systems  Constitutional: Negative.  Negative for chills, fatigue, fever and unexpected weight change.  HENT: Negative for congestion, ear discharge, ear pain, rhinorrhea, sinus pressure, sneezing and sore throat.   Eyes: Negative for photophobia, pain, discharge, redness and itching.  Respiratory: Negative for cough, shortness of breath, wheezing and stridor.   Gastrointestinal: Positive for diarrhea. Negative for abdominal pain, blood in stool, constipation, nausea and vomiting.  Endocrine: Negative for cold intolerance, heat intolerance, polydipsia, polyphagia and polyuria.  Genitourinary: Negative for dysuria, flank pain, frequency, hematuria, menstrual problem, pelvic pain, urgency, vaginal bleeding and vaginal discharge.  Musculoskeletal: Positive for myalgias. Negative for back pain.  Skin: Negative for rash.  Allergic/Immunologic: Negative for environmental allergies and food allergies.  Neurological: Negative for dizziness, weakness, light-headedness, numbness and headaches.  Hematological: Negative for adenopathy. Does not bruise/bleed easily.  Psychiatric/Behavioral: Negative for dysphoric mood. The patient is not nervous/anxious.     Patient Active Problem List   Diagnosis Date Noted  . Sebaceous cyst 12/20/2015  . Abnormal findings-gastrointestinal tract     . Encounter for general adult medical examination without abnormal findings 07/04/2014  . Nerve root pain 07/04/2014  . Gastroesophageal reflux disease 07/04/2014  . HLD (hyperlipidemia) 07/04/2014  . Hypertension 07/04/2014    Allergies  Allergen Reactions  . Guaifenesin & Derivatives   . Methocarbamol Itching  . Penicillins Itching  . Tramadol Hcl Itching    Past Surgical History:  Procedure Laterality Date  . BREAST BIOPSY Right 2004   neg  . CATARACT EXTRACTION W/PHACO Right 04/29/2017   Procedure: CATARACT EXTRACTION PHACO AND INTRAOCULAR LENS PLACEMENT ([facility]) RIGHT;  Surgeon: Eulogio Bear, MD;  Location: [place];  Service: Ophthalmology;  Laterality: Right;  . CATARACT EXTRACTION W/PHACO Left 07/15/2017   Procedure: CATARACT EXTRACTION PHACO AND INTRAOCULAR LENS PLACEMENT ([place]) LEFT;  Surgeon: Eulogio Bear, MD;  Location: [place];  Service: Ophthalmology;  Laterality: Left;  . COLONOSCOPY WITH PROPOFOL N/A 02/06/2015   Procedure: COLONOSCOPY WITH PROPOFOL;  Surgeon: [name], MD;  Location: [place];  Service: Endoscopy;  Laterality: N/A;  . TUBAL LIGATION  1980s  . VAGINAL HYSTERECTOMY  1990s   one ovary removed as well    Social History   Tobacco Use  . Smoking status: Former Smoker    Packs/day: 0.50    Years: 50.00    Pack years: 25.00    Last attempt to quit: 11/22/2011    Years since quitting: 6.0  . Smokeless tobacco: Never Used  . Tobacco comment: quit 2014  Substance Use Topics  . Alcohol use: Yes    Alcohol/week: 2.0 standard drinks    Types: 2 Glasses of wine per week  . Drug use: No     Medication list has been reviewed and updated.  Current Meds  Medication Sig  . aspirin EC 81 MG tablet Take by mouth.  . cholecalciferol (VITAMIN D) 1000 units tablet Take 1,000 Units by mouth daily.  . hydrochlorothiazide (HYDRODIURIL) 25 MG tablet Take 1 tablet (25 mg total) by mouth daily.  . meloxicam (MOBIC)  15 MG tablet Take 1 tablet (15 mg total) by mouth daily.  . pantoprazole (PROTONIX) 40 MG tablet Take 1 tablet (40 mg total) by mouth daily.  . [DISCONTINUED] meloxicam (MOBIC) 15 MG tablet Take 1 tablet (15 mg total) by mouth daily.    PHQ 2/9 Scores 12/04/2016 07/17/2015 07/05/2014  PHQ - 2 Score 0 0 0    Physical Exam  Constitutional: She is oriented to person, place, and time. She appears well-developed and well-nourished.  HENT:  Head: Normocephalic.  Right Ear: External ear normal.  Left Ear: External ear normal.  Mouth/Throat: Oropharynx is clear and moist.  Eyes: Pupils are equal, round, and reactive to light. Conjunctivae and EOM are normal. Lids are everted and swept, no foreign bodies found. Left eye exhibits no hordeolum. No foreign body present in the left eye. Right conjunctiva is not injected. Left conjunctiva is not injected. No scleral icterus.  Neck: Normal range of motion. Neck supple. No JVD present. No tracheal deviation present. No thyromegaly present.  Cardiovascular: Normal rate, regular rhythm, normal heart sounds and intact distal pulses. Exam reveals no gallop and no friction rub.  No murmur heard. Pulmonary/Chest: Effort normal and breath sounds normal. No respiratory distress. She has no wheezes. She has no rales.  Abdominal: Soft. Bowel sounds are normal. She exhibits no mass. There is no hepatosplenomegaly. There is no tenderness. There is no rebound and no guarding.  Musculoskeletal: Normal range of motion. She exhibits no edema or tenderness.  Lymphadenopathy:    She has no cervical adenopathy.  Neurological: She is alert and oriented to person, place, and time. She has normal strength. She displays normal reflexes. No cranial nerve deficit.  Skin: Skin is warm. No rash noted.  Psychiatric: She has a normal mood and affect. Her mood appears not anxious. She does not exhibit a depressed mood.  Nursing note and vitals reviewed.   BP (!) 154/70   Pulse 72    Ht 5\' 1"  (1.549 m)   Wt 147 lb (66.7 kg)   BMI 27.78 kg/m   Assessment and Plan:  1. Pain of left hip joint Stable on med- refill Meloxicam - meloxicam (MOBIC) 15 MG tablet; Take 1 tablet (15 mg total) by mouth daily.  Dispense: 30 tablet; Refill: 3  2. Trochanteric bursitis of left hip Refill Meloxicam - meloxicam (MOBIC) 15 MG tablet; Take 1 tablet (15 mg total) by mouth daily.  Dispense: 30 tablet; Refill: 3  3. Essential hypertension Elevated this visit- recheck in 6 months  4. Menopause Order bone density - DG Bone Density; Future  5. Flu vaccine need administered - Flu vaccine HIGH DOSE PF (Fluzone High dose)   Dr. [facility]  11/25/2017

## 2017-11-25 NOTE — Patient Instructions (Signed)
Leg Cramps Leg cramps occur when a muscle or muscles tighten and you have no control over this tightening (involuntary muscle contraction). Muscle cramps can develop in any muscle, but the most common place is in the calf muscles of the leg. Those cramps can occur during exercise or when you are at rest. Leg cramps are painful, and they may last for a few seconds to a few minutes. Cramps may return several times before they finally stop. Usually, leg cramps are not caused by a serious medical problem. In many cases, the cause is not known. Some common causes include:  Overexertion.  Overuse from repetitive motions, or doing the same thing over and over.  Remaining in a certain position for a long period of time.  Improper preparation, form, or technique while performing a sport or an activity.  Dehydration.  Injury.  Side effects of some medicines.  Abnormally low levels of the salts and ions in your blood (electrolytes), especially potassium and calcium. These levels could be low if you are taking water pills (diuretics) or if you are pregnant.  Follow these instructions at home: Watch your condition for any changes. Taking the following actions may help to lessen any discomfort that you are feeling:  Stay well-hydrated. Drink enough fluid to keep your urine clear or pale yellow.  Try massaging, stretching, and relaxing the affected muscle. Do this for several minutes at a time.  For tight or tense muscles, use a warm towel, heating pad, or hot shower water directed to the affected area.  If you are sore or have pain after a cramp, applying ice to the affected area may relieve discomfort. ? Put ice in a plastic bag. ? Place a towel between your skin and the bag. ? Leave the ice on for 20 minutes, 2-3 times per day.  Avoid strenuous exercise for several days if you have been having frequent leg cramps.  Make sure that your diet includes the essential minerals for your muscles to  work normally.  Take medicines only as directed by your health care provider.  Contact a health care provider if:  Your leg cramps get more severe or more frequent, or they do not improve over time.  Your foot becomes cold, numb, or blue. This information is not intended to replace advice given to you by your health care provider. Make sure you discuss any questions you have with your health care provider. Document Released: 02/15/2004 Document Revised: 06/15/2015 Document Reviewed: 12/15/2013 Elsevier Interactive Patient Education  2018 [employer]  

## 2018-01-01 ENCOUNTER — Ambulatory Visit
Admission: RE | Admit: 2018-01-01 | Discharge: 2018-01-01 | Disposition: A | Discharge status: Home / Self Care | Payer: Medicare HMO | Source: Ambulatory Visit | Attending: Family Medicine | Admitting: Family Medicine

## 2018-01-01 ENCOUNTER — Other Ambulatory Visit: Payer: Self-pay

## 2018-01-01 DIAGNOSIS — Z780 Asymptomatic menopausal state: Secondary | ICD-10-CM | POA: Insufficient documentation

## 2018-01-01 DIAGNOSIS — M85851 Other specified disorders of bone density and structure, right thigh: Secondary | ICD-10-CM | POA: Diagnosis not present

## 2018-01-09 ENCOUNTER — Telehealth: Payer: Self-pay

## 2018-01-09 ENCOUNTER — Ambulatory Visit
Admission: EM | Admit: 2018-01-09 | Discharge: 2018-01-09 | Disposition: A | Discharge status: Home / Self Care | Payer: Medicare HMO | Attending: Family Medicine | Admitting: Family Medicine

## 2018-01-09 ENCOUNTER — Other Ambulatory Visit: Payer: Self-pay

## 2018-01-09 ENCOUNTER — Ambulatory Visit (INDEPENDENT_AMBULATORY_CARE_PROVIDER_SITE_OTHER): Payer: Medicare HMO

## 2018-01-09 DIAGNOSIS — W101XXA Fall (on)(from) sidewalk curb, initial encounter: Secondary | ICD-10-CM

## 2018-01-09 DIAGNOSIS — M25531 Pain in right wrist: Secondary | ICD-10-CM | POA: Diagnosis not present

## 2018-01-09 DIAGNOSIS — M79641 Pain in right hand: Secondary | ICD-10-CM

## 2018-01-09 DIAGNOSIS — S6991XA Unspecified injury of right wrist, hand and finger(s), initial encounter: Secondary | ICD-10-CM | POA: Diagnosis not present

## 2018-01-09 DIAGNOSIS — R0781 Pleurodynia: Secondary | ICD-10-CM

## 2018-01-09 DIAGNOSIS — S299XXA Unspecified injury of thorax, initial encounter: Secondary | ICD-10-CM | POA: Diagnosis not present

## 2018-01-09 DIAGNOSIS — R0789 Other chest pain: Secondary | ICD-10-CM | POA: Diagnosis not present

## 2018-01-09 NOTE — Discharge Instructions (Signed)
I will call with the results.  Take care  Dr. Britni Driscoll  

## 2018-01-09 NOTE — ED Provider Notes (Signed)
[facility]    CSN: [id number] Arrival date & time: 01/09/18  1317  History   Chief Complaint Chief Complaint  Patient presents with  . Fall   HPI  72 year old female presents for evaluation after suffering a fall.  Patient states she suffered a fall last night while shopping.  She states that she tripped on a curb and fell.  She fell on outstretched right hand.  Patient also states that she injured her ribs in the process.  Patient reports right hand and wrist pain.  She also reports shoulder pain as well as right lower rib pain.  Rib pain is worse when she takes a deep breath.  Her pain is currently 5/10 in severity.  No known relieving factors.  She has taken 1 dose of meloxicam without significant improvement.  No other associated symptoms.  No other complaints.  PMH, Surgical Hx, Family Hx, Social History reviewed and updated as below.  Past Medical History:  Diagnosis Date  . Bone spur    right side of neck  . Colitis   . Diverticulitis   . GERD (gastroesophageal reflux disease)   . Heart murmur    followed by PCP  . Hypertension   . Seasonal allergies     Patient Active Problem List   Diagnosis Date Noted  . Sebaceous cyst 12/20/2015  . Abnormal findings-gastrointestinal tract   . Encounter for general adult medical examination without abnormal findings 07/04/2014  . Nerve root pain 07/04/2014  . Gastroesophageal reflux disease 07/04/2014  . HLD (hyperlipidemia) 07/04/2014  . Hypertension 07/04/2014    Past Surgical History:  Procedure Laterality Date  . BREAST BIOPSY Right 2004   neg  . CATARACT EXTRACTION W/PHACO Right 04/29/2017   Procedure: CATARACT EXTRACTION PHACO AND INTRAOCULAR LENS PLACEMENT (IOC) RIGHT;  Surgeon: Eulogio Bear, MD;  Location: [place];  Service: Ophthalmology;  Laterality: Right;  . CATARACT EXTRACTION W/PHACO Left 07/15/2017   Procedure: CATARACT EXTRACTION PHACO AND INTRAOCULAR LENS PLACEMENT ([place]) LEFT;   Surgeon: Eulogio Bear, MD;  Location: [place];  Service: Ophthalmology;  Laterality: Left;  . COLONOSCOPY WITH PROPOFOL N/A 02/06/2015   Procedure: COLONOSCOPY WITH PROPOFOL;  Surgeon: [name], MD;  Location: [place];  Service: Endoscopy;  Laterality: N/A;  . TUBAL LIGATION  1980s  . VAGINAL HYSTERECTOMY  1990s   one ovary removed as well    OB History   No obstetric history on file.      Home Medications    Prior to Admission medications   Medication Sig Start Date End Date Taking? Authorizing Provider  calcium carbonate (OS-CAL - DOSED IN MG OF ELEMENTAL CALCIUM) 1250 (500 Ca) MG tablet Take 1 tablet by mouth.   Yes [provider]  aspirin EC 81 MG tablet Take by mouth.    [provider]  cholecalciferol (VITAMIN D) 1000 units tablet Take 1,000 Units by mouth daily.    [provider]  hydrochlorothiazide (HYDRODIURIL) 25 MG tablet Take 1 tablet (25 mg total) by mouth daily. 07/22/17   Juline Patch, MD  meloxicam (MOBIC) 15 MG tablet Take 1 tablet (15 mg total) by mouth daily. 11/25/17   Juline Patch, MD  pantoprazole (PROTONIX) 40 MG tablet Take 1 tablet (40 mg total) by mouth daily. 07/22/17   Juline Patch, MD    Family History Family History  Problem Relation Age of Onset  . Stroke Mother   . Arthritis Father   . Heart  disease Paternal Aunt   . Cancer Paternal Grandmother   . Stomach cancer Paternal Grandmother   . Breast cancer Cousin        pat cousin    Social History Social History   Tobacco Use  . Smoking status: Former Smoker    Packs/day: 0.50    Years: 50.00    Pack years: 25.00    Last attempt to quit: 11/22/2011    Years since quitting: 6.1  . Smokeless tobacco: Never Used  . Tobacco comment: quit 2014  Substance Use Topics  . Alcohol use: Yes    Alcohol/week: 2.0 standard drinks    Types: 2 Glasses of wine per week  . Drug use: No     Allergies   Guaifenesin & derivatives;  Methocarbamol; Penicillins; and Tramadol hcl   Review of Systems Review of Systems  Constitutional: Negative.   Musculoskeletal:       Rib pain, wrist and hand pain.   Physical Exam Triage Vital Signs ED Triage Vitals  Enc Vitals Group     BP 01/09/18 1326 (!) 152/81     Pulse Rate 01/09/18 1326 64     Resp 01/09/18 1326 16     Temp 01/09/18 1326 98.2 F (36.8 C)     Temp Source 01/09/18 1326 Oral     SpO2 01/09/18 1326 98 %     Weight 01/09/18 1328 146 lb (66.2 kg)     Height 01/09/18 1328 5\' 2"  (1.575 m)     Head Circumference --      Peak Flow --      Pain Score 01/09/18 1328 5     Pain Loc --      Pain Edu? --      Excl. in [place]? --    Updated Vital Signs BP (!) 152/81 (BP Location: Left Arm)   Pulse 64   Temp 98.2 F (36.8 C) (Oral)   Resp 16   Ht 5\' 2"  (1.575 m)   Wt 66.2 kg   SpO2 98%   BMI 26.70 kg/m   Visual Acuity Right Eye Distance:   Left Eye Distance:   Bilateral Distance:    Right Eye Near:   Left Eye Near:    Bilateral Near:     Physical Exam Vitals signs and nursing note reviewed.  Constitutional:      General: She is not in acute distress. HENT:     Head: Normocephalic and atraumatic.     Nose: Nose normal.  Eyes:     Conjunctiva/sclera: Conjunctivae normal.  Cardiovascular:     Rate and Rhythm: Normal rate and regular rhythm.  Pulmonary:     Effort: Pulmonary effort is normal.     Breath sounds: Normal breath sounds. No wheezing, rhonchi or rales.  Musculoskeletal:     Comments: Right hand and wrist -patient with mild tenderness of the MCP joint of the right middle finger.  Mild tenderness dorsally in the midline of the wrist.  Right shoulder - full ROM.  Right lower ribs -patient with pain with deep inspiration.  Mild tenderness palpation.  Neurological:     Mental Status: She is alert.  Psychiatric:        Mood and Affect: Mood normal.        Behavior: Behavior normal.    UC Treatments / Results  Labs (all labs ordered  are listed, but only abnormal results are displayed) Labs Reviewed - No data to display  EKG None  Radiology Dg Ribs  Unilateral W/chest Right  Result Date: 01/09/2018 CLINICAL DATA:  Recent fall yesterday with right-sided chest pain, initial encounter EXAM: RIGHT RIBS AND CHEST - 3+ VIEW COMPARISON:  12/18/2015 FINDINGS: Cardiac shadow is within normal limits. Aortic calcifications are again noted and stable. The lungs are well aerated without focal infiltrate or sizable pneumothorax. No rib fractures are noted. IMPRESSION: No evidence of acute rib fracture noted. Electronically Signed   By: Inez Catalina M.D.   On: 01/09/2018 14:19   Dg Wrist Complete Right  Result Date: 01/09/2018 CLINICAL DATA:  Right wrist pain following fall yesterday, initial encounter EXAM: RIGHT WRIST - COMPLETE 3+ VIEW COMPARISON:  None. FINDINGS: No acute fracture or dislocation is noted. No soft tissue abnormality is seen. IMPRESSION: No acute abnormality noted. Electronically Signed   By: Inez Catalina M.D.   On: 01/09/2018 14:14   Dg Hand Complete Right  Result Date: 01/09/2018 CLINICAL DATA:  Fall yesterday with hand pain, initial encounter EXAM: RIGHT HAND - COMPLETE 3+ VIEW COMPARISON:  None. FINDINGS: No acute fracture or dislocation is noted. Degenerative changes about the distal interphalangeal joints particularly in the second through fourth PIP joints. No soft tissue abnormality noted. IMPRESSION: Degenerative change without acute abnormality. Electronically Signed   By: Inez Catalina M.D.   On: 01/09/2018 14:10    Procedures Procedures (including critical care time)  Medications Ordered in UC Medications - No data to display  Initial Impression / Assessment and Plan / UC Course  I have reviewed the triage vital signs and the nursing notes.  Pertinent labs & imaging results that were available during my care of the patient were reviewed by me and considered in my medical decision making (see chart  for details).    72 year old female presents with pain after suffering a fall.  X-rays negative.  Supportive care.  Meloxicam as needed.  Final Clinical Impressions(s) / UC Diagnoses   Final diagnoses:  Rib pain  Pain of right hand  Right wrist pain     Discharge Instructions     I will call with the results.  Take care  Dr. Lacinda Axon    ED Prescriptions    None     Controlled Substance Prescriptions [place] Controlled Substance Registry consulted? Not Applicable   Coral Spikes, DO 01/09/18 0109

## 2018-01-09 NOTE — Telephone Encounter (Signed)
Spoke with pt. Pt. Advised of all x-ray results. [facility]

## 2018-01-09 NOTE — ED Triage Notes (Signed)
Pt tripped and fell on curb last night while shopping. C/o right hand middle finger pain, right wrist pain and right rib pain with a deep breath. Pain 5/10

## 2018-01-29 ENCOUNTER — Encounter: Payer: Self-pay | Admitting: Emergency Medicine

## 2018-01-29 ENCOUNTER — Other Ambulatory Visit: Payer: Self-pay

## 2018-01-29 ENCOUNTER — Ambulatory Visit
Admission: EM | Admit: 2018-01-29 | Discharge: 2018-01-29 | Disposition: A | Discharge status: Home / Self Care | Payer: Medicare HMO | Attending: Emergency Medicine | Admitting: Emergency Medicine

## 2018-01-29 DIAGNOSIS — H1012 Acute atopic conjunctivitis, left eye: Secondary | ICD-10-CM | POA: Insufficient documentation

## 2018-01-29 MED ORDER — TETRACAINE HCL 0.5 % OP SOLN: 1.0000 [drp] | Freq: Once | OPHTHALMIC | Status: DC

## 2018-01-29 MED ORDER — FLUORESCEIN SODIUM 1 MG OP STRP: 1.0000 | ORAL_STRIP | Freq: Once | OPHTHALMIC | Status: DC

## 2018-01-29 MED ORDER — OLOPATADINE HCL 0.1 % OP SOLN: 1.0000 [drp] | Freq: Two times a day (BID) | OPHTHALMIC | 0 refills | Status: AC

## 2018-01-29 NOTE — ED Provider Notes (Signed)
[facility] ____________________________________________  Time seen: Approximately 6:16 PM  I have reviewed the triage vital signs and the nursing notes.   HISTORY  Chief Complaint Eye Drainage  HPI Emily Boyer is a 73 y.o. female presenting for evaluation of left eye itching and watering for the last 2 days.  States the eye has felt slightly irritated but denies any pain to the eye.  Denies any light sensitivity or vision loss.  States the eye has slight blurriness with the drainage.  States the drainage has been clear to slightly milky.  But states that she believes the milking this was from the Systane lubricating eyedrop she started to use last night.  No matting shut, no purulent drainage.  Denies foreign body sensation.  Denies abrupt onset.  States that she is around children a lot.  Denies known direct sick contacts.  Denies right eye involvement.  Does report she has a history of allergies and states that she has had some nasal burning and irritation in the last few days consistent with her seasonal allergies.  Has not been using her allergy medication.  Denies any swelling or pain.  Reports otherwise doing well.  No fevers.  Denies other complaints.  Denies glasses or contact use.  Juline Patch, MD: PCP   Past Medical History:  Diagnosis Date  . Bone spur    right side of neck  . Colitis   . Diverticulitis   . GERD (gastroesophageal reflux disease)   . Heart murmur    followed by PCP  . Hypertension   . Seasonal allergies     Patient Active Problem List   Diagnosis Date Noted  . Sebaceous cyst 12/20/2015  . Abnormal findings-gastrointestinal tract   . Encounter for general adult medical examination without abnormal findings 07/04/2014  . Nerve root pain 07/04/2014  . Gastroesophageal reflux disease 07/04/2014  . HLD (hyperlipidemia) 07/04/2014  . Hypertension 07/04/2014    Past Surgical History:  Procedure Laterality Date  . BREAST BIOPSY  Right 2004   neg  . CATARACT EXTRACTION W/PHACO Right 04/29/2017   Procedure: CATARACT EXTRACTION PHACO AND INTRAOCULAR LENS PLACEMENT (IOC) RIGHT;  Surgeon: Eulogio Bear, MD;  Location: [place];  Service: Ophthalmology;  Laterality: Right;  . CATARACT EXTRACTION W/PHACO Left 07/15/2017   Procedure: CATARACT EXTRACTION PHACO AND INTRAOCULAR LENS PLACEMENT ([place]) LEFT;  Surgeon: Eulogio Bear, MD;  Location: [place];  Service: Ophthalmology;  Laterality: Left;  . COLONOSCOPY WITH PROPOFOL N/A 02/06/2015   Procedure: COLONOSCOPY WITH PROPOFOL;  Surgeon: [name], MD;  Location: [place];  Service: Endoscopy;  Laterality: N/A;  . TUBAL LIGATION  1980s  . VAGINAL HYSTERECTOMY  1990s   one ovary removed as well      Current Facility-Administered Medications:  .  fluorescein ophthalmic strip 1 strip, 1 strip, Left Eye, Once, Marylene Land, NP .  tetracaine (PONTOCAINE) 0.5 % ophthalmic solution 1 drop, 1 drop, Left Eye, Once, Marylene Land, NP  Current Outpatient Medications:  .  aspirin EC 81 MG tablet, Take by mouth., Disp: , Rfl:  .  calcium carbonate (OS-CAL - DOSED IN MG OF ELEMENTAL CALCIUM) 1250 (500 Ca) MG tablet, Take 1 tablet by mouth., Disp: , Rfl:  .  cholecalciferol (VITAMIN D) 1000 units tablet, Take 1,000 Units by mouth daily., Disp: , Rfl:  .  hydrochlorothiazide (HYDRODIURIL) 25 MG tablet, Take 1 tablet (25 mg total) by mouth daily., Disp: 90 tablet, Rfl: 1 .  meloxicam (  MOBIC) 15 MG tablet, Take 1 tablet (15 mg total) by mouth daily., Disp: 30 tablet, Rfl: 3 .  pantoprazole (PROTONIX) 40 MG tablet, Take 1 tablet (40 mg total) by mouth daily., Disp: 90 tablet, Rfl: 1 .  olopatadine (PATANOL) 0.1 % ophthalmic solution, Place 1 drop into the left eye 2 (two) times daily for 7 days., Disp: 5 mL, Rfl: 0  Allergies Guaifenesin & derivatives; Methocarbamol; Penicillins; and Tramadol hcl  Family History  Problem Relation Age of Onset   . Stroke Mother   . Arthritis Father   . Heart disease Paternal Aunt   . Cancer Paternal Grandmother   . Stomach cancer Paternal Grandmother   . Breast cancer Cousin        pat cousin    Social History Social History   Tobacco Use  . Smoking status: Former Smoker    Packs/day: 0.50    Years: 50.00    Pack years: 25.00    Last attempt to quit: 11/22/2011    Years since quitting: 6.1  . Smokeless tobacco: Never Used  . Tobacco comment: quit 2014  Substance Use Topics  . Alcohol use: Yes    Alcohol/week: 2.0 standard drinks    Types: 2 Glasses of wine per week  . Drug use: No    Review of Systems Constitutional: No fever Eyes: As above.  ENT: No sore throat. Cardiovascular: Denies chest pain. Respiratory: Denies shortness of breath. Gastrointestinal: No abdominal pain.   Musculoskeletal: Negative for back pain. Skin: Negative for rash.  ____________________________________________   PHYSICAL EXAM:  VITAL SIGNS: ED Triage Vitals  Enc Vitals Group     BP 01/29/18 1648 (!) 145/91     Pulse Rate 01/29/18 1648 64     Resp 01/29/18 1648 18     Temp 01/29/18 1648 98 F (36.7 C)     Temp Source 01/29/18 1648 Oral     SpO2 01/29/18 1648 99 %     Weight 01/29/18 1644 146 lb (66.2 kg)     Height 01/29/18 1644 5\' 1"  (1.549 m)     Head Circumference --      Peak Flow --      Pain Score 01/29/18 1644 0     Pain Loc --      Pain Edu? --      Excl. in [place]? --      Visual Acuity  Right Eye Distance: 20/25(uncorrected) Left Eye Distance: 20/40(uncorrected) Bilateral Distance: 20/20(uncorrected)  Constitutional: Alert and oriented. Well appearing and in no acute distress. Eyes: Left conjunctive a mild diffuse injection.  No right conjunctival injection.  No foreign body visualized bilaterally.  No drainage bilaterally.  PERRL. EOMI. no pain with EOMs.  Left eye examined with tetracaine and fluorescein, no corneal abrasion or dye uptake noted.  Bilateral eyes nontender.   No surrounding tenderness, swelling or erythema bilaterally. Head: Atraumatic. No sinus tenderness to palpation. No swelling. No erythema.  Ears: no erythema, normal TMs bilaterally.   Nose:slight nasal congestion   Mouth/Throat: Mucous membranes are moist. No pharyngeal erythema. No tonsillar swelling or exudate.  Neck: No stridor.  No cervical spine tenderness to palpation. Hematological/Lymphatic/Immunilogical: No cervical lymphadenopathy. Cardiovascular: Normal rate, regular rhythm. Grossly normal heart sounds.  Good peripheral circulation. Respiratory: Normal respiratory effort.  No retractions. No wheezes, rales or rhonchi. Good air movement.  Musculoskeletal: Ambulatory with steady gait.  Neurologic:  Normal speech and language. No gait instability. Skin:  Skin appears warm, dry and intact. No rash noted.  Psychiatric: Mood and affect are normal. Speech and behavior are normal. ___________________________________________   LABS (all labs ordered are listed, but only abnormal results are displayed)  Labs Reviewed - No data to display  PROCEDURES Procedures   Eye exam Procedure explained and verbal consent obtained.  Anesthesia: tetracaine ophthalmic 2 drops Left eye examined with fluorescein strip.  No foreign bodies visualized. No corneal abrasion noted.  Patient tolerated well.    INITIAL IMPRESSION / ASSESSMENT AND PLAN / ED COURSE  Pertinent labs & imaging results that were available during my care of the patient were reviewed by me and considered in my medical decision making (see chart for details).  Well-appearing patient.  No acute distress.  Left eye itching and watering for the last 2 days.  History of allergies with also some nasal irritation consistent with her allergies.  Does not appear consistent with bacterial infection.  Suspect allergic conjunctivitis.  Will treat with Patanol.  Resume home Flonase and Zyrtec.  Discussed keeping hands clean, monitoring and  discussed her follow-up and return parameters.Discussed indication, risks and benefits of medications with patient.  Discussed follow up with Primary care physician this week. Discussed follow up and return parameters including no resolution or any worsening concerns. Patient verbalized understanding and agreed to plan.   ____________________________________________   FINAL CLINICAL IMPRESSION(S) / ED DIAGNOSES  Final diagnoses:  Allergic conjunctivitis of left eye     ED Discharge Orders         Ordered    olopatadine (PATANOL) 0.1 % ophthalmic solution  2 times daily     01/29/18 1836           Note: This dictation was prepared with Dragon dictation along with smaller phrase technology. Any transcriptional errors that result from this process are unintentional.         Marylene Land, NP 01/29/18 1923

## 2018-01-29 NOTE — Discharge Instructions (Addendum)
Use medication as prescribed. Home allergy regimen. Monitor. Good hand hygiene.  Follow up with your primary care physician or ophthalmologist this week as needed.    Return to Urgent care for new or worsening concerns.

## 2018-01-29 NOTE — ED Triage Notes (Signed)
Patient c/o left eye drainage and itching that started last night. Patient has been using Systane eye drops with no relief.

## 2018-03-02 ENCOUNTER — Other Ambulatory Visit: Payer: Self-pay | Admitting: Family Medicine

## 2018-03-02 DIAGNOSIS — K219 Gastro-esophageal reflux disease without esophagitis: Secondary | ICD-10-CM

## 2018-03-04 DIAGNOSIS — H1045 Other chronic allergic conjunctivitis: Secondary | ICD-10-CM | POA: Diagnosis not present

## 2018-04-01 ENCOUNTER — Other Ambulatory Visit: Payer: Self-pay | Admitting: Family Medicine

## 2018-04-01 DIAGNOSIS — Z1231 Encounter for screening mammogram for malignant neoplasm of breast: Secondary | ICD-10-CM

## 2018-04-02 ENCOUNTER — Other Ambulatory Visit: Payer: Self-pay | Admitting: Family Medicine

## 2018-04-02 DIAGNOSIS — I10 Essential (primary) hypertension: Secondary | ICD-10-CM

## 2018-04-08 ENCOUNTER — Ambulatory Visit: Payer: Self-pay

## 2018-05-26 ENCOUNTER — Other Ambulatory Visit: Payer: Self-pay

## 2018-05-26 ENCOUNTER — Ambulatory Visit (INDEPENDENT_AMBULATORY_CARE_PROVIDER_SITE_OTHER): Payer: Medicare HMO | Admitting: Family Medicine

## 2018-05-26 ENCOUNTER — Encounter: Payer: Self-pay | Admitting: Family Medicine

## 2018-05-26 VITALS — BP 132/70 | HR 68 | Ht 61.0 in | Wt 150.0 lb

## 2018-05-26 DIAGNOSIS — K219 Gastro-esophageal reflux disease without esophagitis: Secondary | ICD-10-CM

## 2018-05-26 DIAGNOSIS — E663 Overweight: Secondary | ICD-10-CM | POA: Diagnosis not present

## 2018-05-26 DIAGNOSIS — M7541 Impingement syndrome of right shoulder: Secondary | ICD-10-CM | POA: Diagnosis not present

## 2018-05-26 DIAGNOSIS — R1310 Dysphagia, unspecified: Secondary | ICD-10-CM

## 2018-05-26 DIAGNOSIS — I10 Essential (primary) hypertension: Secondary | ICD-10-CM

## 2018-05-26 DIAGNOSIS — R1319 Other dysphagia: Secondary | ICD-10-CM

## 2018-05-26 MED ORDER — HYDROCHLOROTHIAZIDE 25 MG PO TABS: 25.0000 mg | ORAL_TABLET | Freq: Every day | ORAL | 1 refills | Status: DC

## 2018-05-26 MED ORDER — PANTOPRAZOLE SODIUM 40 MG PO TBEC: 40.0000 mg | DELAYED_RELEASE_TABLET | Freq: Every day | ORAL | 1 refills | Status: DC

## 2018-05-26 NOTE — Progress Notes (Signed)
Date:  05/26/2018   Name:  Emily Boyer   DOB:  September 19, 1945   MRN:  [id number]   Chief Complaint: Gastroesophageal Reflux (change pantoprazole- not helping); Hypertension; and Shoulder Pain (R) shoulder pain- has been taking Meloxicam- not helping. pain goes down into hand)  Gastroesophageal Reflux  She complains of dysphagia and heartburn. She reports no abdominal pain, no belching, no chest pain, no choking, no coughing, no early satiety, no globus sensation, no hoarse voice, no nausea, no sore throat, no stridor, no water brash or no wheezing. This is a chronic problem. The current episode started more than 1 year ago. The problem has been gradually improving. The heartburn is located in the substernum. The symptoms are aggravated by certain foods. Pertinent negatives include no fatigue. She has tried a PPI for the symptoms. The treatment provided mild relief.  Hypertension  This is a chronic problem. Pertinent negatives include no chest pain, headaches or shortness of breath.  Shoulder Pain   Pertinent negatives include no fever or numbness.    Review of Systems  Constitutional: Negative.  Negative for chills, fatigue, fever and unexpected weight change.  HENT: Negative for congestion, ear discharge, ear pain, hoarse voice, rhinorrhea, sinus pressure, sneezing and sore throat.   Eyes: Negative for photophobia, pain, discharge, redness and itching.  Respiratory: Negative for cough, choking, shortness of breath, wheezing and stridor.   Cardiovascular: Negative for chest pain.  Gastrointestinal: Positive for dysphagia and heartburn. Negative for abdominal pain, blood in stool, constipation, diarrhea, nausea and vomiting.  Endocrine: Negative for cold intolerance, heat intolerance, polydipsia, polyphagia and polyuria.  Genitourinary: Negative for dysuria, flank pain, frequency, hematuria, menstrual problem, pelvic pain, urgency, vaginal bleeding and vaginal discharge.  Musculoskeletal:  Negative for arthralgias, back pain and myalgias.  Skin: Negative for rash.  Allergic/Immunologic: Negative for environmental allergies and food allergies.  Neurological: Negative for dizziness, weakness, light-headedness, numbness and headaches.  Hematological: Negative for adenopathy. Does not bruise/bleed easily.  Psychiatric/Behavioral: Negative for dysphoric mood. The patient is not nervous/anxious.     Patient Active Problem List   Diagnosis Date Noted  . Sebaceous cyst 12/20/2015  . Abnormal findings-gastrointestinal tract   . Encounter for general adult medical examination without abnormal findings 07/04/2014  . Nerve root pain 07/04/2014  . Gastroesophageal reflux disease 07/04/2014  . HLD (hyperlipidemia) 07/04/2014  . Hypertension 07/04/2014    Allergies  Allergen Reactions  . Guaifenesin & Derivatives   . Methocarbamol Itching  . Penicillins Itching  . Tramadol Hcl Itching    Past Surgical History:  Procedure Laterality Date  . BREAST BIOPSY Right 2004   neg  . CATARACT EXTRACTION W/PHACO Right 04/29/2017   Procedure: CATARACT EXTRACTION PHACO AND INTRAOCULAR LENS PLACEMENT ([facility]) RIGHT;  Surgeon: Eulogio Bear, MD;  Location: [facility];  Service: Ophthalmology;  Laterality: Right;  . CATARACT EXTRACTION W/PHACO Left 07/15/2017   Procedure: CATARACT EXTRACTION PHACO AND INTRAOCULAR LENS PLACEMENT ([place]) LEFT;  Surgeon: Eulogio Bear, MD;  Location: [facility];  Service: Ophthalmology;  Laterality: Left;  . COLONOSCOPY WITH PROPOFOL N/A 02/06/2015   Procedure: COLONOSCOPY WITH PROPOFOL;  Surgeon: [name], MD;  Location: [facility];  Service: Endoscopy;  Laterality: N/A;  . TUBAL LIGATION  1980s  . VAGINAL HYSTERECTOMY  1990s   one ovary removed as well    Social History   Tobacco Use  . Smoking status: Former Smoker    Packs/day: 0.50    Years: 50.00  Pack years: 25.00    Last attempt to quit: 11/22/2011    Years since  quitting: 6.5  . Smokeless tobacco: Never Used  . Tobacco comment: quit 2014  Substance Use Topics  . Alcohol use: Yes    Alcohol/week: 2.0 standard drinks    Types: 2 Glasses of wine per week  . Drug use: No     Medication list has been reviewed and updated.  Current Meds  Medication Sig  . aspirin EC 81 MG tablet Take by mouth.  . calcium carbonate (OS-CAL - DOSED IN MG OF ELEMENTAL CALCIUM) 1250 (500 Ca) MG tablet Take 1 tablet by mouth.  . cholecalciferol (VITAMIN D) 1000 units tablet Take 1,000 Units by mouth daily.  . hydrochlorothiazide (HYDRODIURIL) 25 MG tablet Take 1 tablet by mouth once daily  . meloxicam (MOBIC) 15 MG tablet Take 1 tablet (15 mg total) by mouth daily.  . pantoprazole (PROTONIX) 40 MG tablet TAKE 1 TABLET BY MOUTH ONCE DAILY    PHQ 2/9 Scores 05/26/2018 12/04/2016 07/17/2015 07/05/2014  PHQ - 2 Score 0 0 0 0  PHQ- 9 Score 0 - - -    BP Readings from Last 3 Encounters:  05/26/18 132/70  01/29/18 (!) 145/91  01/09/18 (!) 152/81    Physical Exam Vitals signs and nursing note reviewed.  Constitutional:      General: She is not in acute distress.    Appearance: She is not diaphoretic.  HENT:     Head: Normocephalic and atraumatic.     Right Ear: Tympanic membrane, ear canal and external ear normal. There is no impacted cerumen.     Left Ear: Tympanic membrane, ear canal and external ear normal. There is no impacted cerumen.     Nose: Nose normal. No congestion or rhinorrhea.     Mouth/Throat:     Mouth: Mucous membranes are moist.  Eyes:     General:        Right eye: No discharge.        Left eye: No discharge.     Conjunctiva/sclera: Conjunctivae normal.     Pupils: Pupils are equal, round, and reactive to light.  Neck:     Musculoskeletal: Normal range of motion and neck supple.     Thyroid: No thyromegaly.     Vascular: No JVD.  Cardiovascular:     Rate and Rhythm: Normal rate and regular rhythm.     Pulses: Normal pulses.     Heart  sounds: Normal heart sounds. No murmur. No friction rub. No gallop.   Pulmonary:     Effort: Pulmonary effort is normal. No respiratory distress.     Breath sounds: Normal breath sounds. No wheezing, rhonchi or rales.  Abdominal:     General: Bowel sounds are normal. There is no distension.     Palpations: Abdomen is soft. There is no mass.     Tenderness: There is no abdominal tenderness. There is no guarding.  Musculoskeletal: Normal range of motion.     Right shoulder: She exhibits tenderness.     Comments: Tender supraspinatis  Lymphadenopathy:     Cervical: No cervical adenopathy.  Skin:    General: Skin is warm and dry.  Neurological:     Mental Status: She is alert.     Deep Tendon Reflexes: Reflexes are normal and symmetric.     Wt Readings from Last 3 Encounters:  05/26/18 150 lb (68 kg)  01/29/18 146 lb (66.2 kg)  01/09/18 146 lb (66.2  kg)    BP 132/70   Pulse 68   Ht 5\' 1"  (1.549 m)   Wt 150 lb (68 kg)   BMI 28.34 kg/m   Assessment and Plan: 1. Essential hypertension .  Controlled.  Continue hydrochlorothiazide 25 mg 1 a day.  Reviewed previous labs within normal limits will recheck in 6 months - hydrochlorothiazide (HYDRODIURIL) 25 MG tablet; Take 1 tablet (25 mg total) by mouth daily.  Dispense: 90 tablet; Refill: 1  2. Gastroesophageal reflux disease, esophagitis presence not specified Chronic.  Controlled.  However still in some areas of dysphasia see below we will continue Protonix 40 mg for the time being. - pantoprazole (PROTONIX) 40 MG tablet; Take 1 tablet (40 mg total) by mouth daily.  Dispense: 90 tablet; Refill: 1  3. Esophageal dysphagia New onset and patient often has to drink water to sore to relieve it this is in the mid esophageal area and patient is seen Dr. Verl Blalock in the past for colonoscopy.  We will refer to gastroenterology for reevaluation and possible endoscopy. - Ambulatory referral to Gastroenterology  4. Impingement syndrome of right  shoulder Patient has new onset shoulder which did not relieve on meloxicam 15 mg.  Will refer back to Dr. Monday for reevaluation and possible injection of suspected impingement. - Ambulatory referral to Orthopedic Surgery  5. Overweight (BMI 25.0-29.9) Health risks of being over weight were discussed and patient was counseled on weight loss options and exercise. Patient was given low cholesterol triglyceride diet sheet.

## 2018-05-26 NOTE — Patient Instructions (Signed)

## 2018-05-28 DIAGNOSIS — M5030 Other cervical disc degeneration, unspecified cervical region: Secondary | ICD-10-CM | POA: Diagnosis not present

## 2018-05-28 DIAGNOSIS — M25511 Pain in right shoulder: Secondary | ICD-10-CM | POA: Diagnosis not present

## 2018-05-28 DIAGNOSIS — M5412 Radiculopathy, cervical region: Secondary | ICD-10-CM | POA: Diagnosis not present

## 2018-05-28 DIAGNOSIS — M542 Cervicalgia: Secondary | ICD-10-CM | POA: Diagnosis not present

## 2018-05-28 DIAGNOSIS — G8929 Other chronic pain: Secondary | ICD-10-CM | POA: Diagnosis not present

## 2018-06-02 ENCOUNTER — Ambulatory Visit (INDEPENDENT_AMBULATORY_CARE_PROVIDER_SITE_OTHER): Payer: Medicare HMO | Admitting: Gastroenterology

## 2018-06-02 ENCOUNTER — Ambulatory Visit: Payer: Medicare HMO | Admitting: Gastroenterology

## 2018-06-02 ENCOUNTER — Encounter: Payer: Self-pay | Admitting: Gastroenterology

## 2018-06-02 DIAGNOSIS — R4702 Dysphasia: Secondary | ICD-10-CM | POA: Diagnosis not present

## 2018-06-02 DIAGNOSIS — K219 Gastro-esophageal reflux disease without esophagitis: Secondary | ICD-10-CM | POA: Diagnosis not present

## 2018-06-02 NOTE — Progress Notes (Signed)
Emily Lame, MD [address]  [place]  [place], [place] [address]  Main: [phone]  Fax: [phone]    Gastroenterology Virtual/Video Visit  Referring Provider:     Juline Patch, MD Primary Care Physician:  Juline Patch, MD Primary Gastroenterologist:  Dr.Fredi Hurtado Allen Norris Reason for Consultation:     Dysphasia        HPI:    Virtual Visit via Video Note Location of the patient: Home Location of provider: Office  Participating persons: The patient myself and Emily Boyer.  I connected with Emily Boyer on 06/02/18 at 10:00 AM EDT by a video enabled telemedicine application and verified that I am speaking with the correct person using two identifiers.   I discussed the limitations of evaluation and management by telemedicine and the availability of in person appointments. The patient expressed understanding and agreed to proceed.  Verbal consent to proceed obtained.  History of Present Illness: Emily Boyer is a 73 y.o. female referred by Dr. Juline Patch, MD  for consultation & management of dysphasia.  This patient was sent to me after reporting dysphasia and was seen by her primary care provider approximately 1 week ago.  At that time the patient had reported some associated heartburn.  She states that she has had the symptoms for the last year.  She has been on a PPI which has given her some relief in the past.  The patient had a colonoscopy by me in 2017 that was normal except for diverticulosis. She reports chest discomfort when she goes to sleep. It is worse with wine, chocolate and coffee.  The patient denies any black stools blood or bloody stools.  She also denies any unexplained weight loss and states that since she has been isolated due to the pandemic she has been eating more and has gained weight.  The patient works as a Teacher, early years/pre.  She also reports that many times she will have a coughing spell that she believes may be due to her  reflux.   Past Medical History:  Diagnosis Date  . Bone spur    right side of neck  . Colitis   . Diverticulitis   . GERD (gastroesophageal reflux disease)   . Heart murmur    followed by PCP  . Hypertension   . Seasonal allergies     Past Surgical History:  Procedure Laterality Date  . BREAST BIOPSY Right 2004   neg  . CATARACT EXTRACTION W/PHACO Right 04/29/2017   Procedure: CATARACT EXTRACTION PHACO AND INTRAOCULAR LENS PLACEMENT ([facility]) RIGHT;  Surgeon: Eulogio Bear, MD;  Location: [facility];  Service: Ophthalmology;  Laterality: Right;  . CATARACT EXTRACTION W/PHACO Left 07/15/2017   Procedure: CATARACT EXTRACTION PHACO AND INTRAOCULAR LENS PLACEMENT ([place]) LEFT;  Surgeon: Eulogio Bear, MD;  Location: [facility];  Service: Ophthalmology;  Laterality: Left;  . COLONOSCOPY WITH PROPOFOL N/A 02/06/2015   Procedure: COLONOSCOPY WITH PROPOFOL;  Surgeon: Emily Lame, MD;  Location: [facility];  Service: Endoscopy;  Laterality: N/A;  . TUBAL LIGATION  1980s  . VAGINAL HYSTERECTOMY  1990s   one ovary removed as well    Prior to Admission medications   Medication Sig Start Date End Date Taking? Authorizing Provider  aspirin EC 81 MG tablet Take by mouth.    [provider]  calcium carbonate (OS-CAL - DOSED IN MG OF ELEMENTAL CALCIUM) 1250 (500 Ca) MG tablet Take 1 tablet by mouth.  [provider]  cholecalciferol (VITAMIN D) 1000 units tablet Take 1,000 Units by mouth daily.    [provider]  hydrochlorothiazide (HYDRODIURIL) 25 MG tablet Take 1 tablet (25 mg total) by mouth daily. 05/26/18   Juline Patch, MD  pantoprazole (PROTONIX) 40 MG tablet Take 1 tablet (40 mg total) by mouth daily. 05/26/18   Juline Patch, MD    Family History  Problem Relation Age of Onset  . Stroke Mother   . Arthritis Father   . Heart disease Paternal Aunt   . Cancer Paternal Grandmother   . Stomach cancer Paternal Grandmother    . Breast cancer Cousin        pat cousin     Social History   Tobacco Use  . Smoking status: Former Smoker    Packs/day: 0.50    Years: 50.00    Pack years: 25.00    Last attempt to quit: 11/22/2011    Years since quitting: 6.5  . Smokeless tobacco: Never Used  . Tobacco comment: quit 2014  Substance Use Topics  . Alcohol use: Yes    Alcohol/week: 2.0 standard drinks    Types: 2 Glasses of wine per week  . Drug use: No    Allergies as of 06/02/2018 - Review Complete 05/26/2018  Allergen Reaction Noted  . Guaifenesin & derivatives  11/19/2016  . Methocarbamol Itching 07/05/2014  . Penicillins Itching 07/04/2014  . Tramadol hcl Itching 07/04/2014    Review of Systems:    All systems reviewed and negative except where noted in HPI.   Observations/Objective:  Labs: CBC    Component Value Date/Time   WBC 6.7 12/09/2014 1201   WBC 8.6 12/07/2014 1403   RBC 4.45 12/09/2014 1201   RBC 4.73 12/07/2014 1403   HGB 13.1 12/09/2014 1201   HCT 38.3 12/09/2014 1201   PLT 287 12/09/2014 1201   MCV 86 12/09/2014 1201   MCH 29.4 12/09/2014 1201   MCH 29.8 12/07/2014 1403   MCHC 34.2 12/09/2014 1201   MCHC 34.5 12/07/2014 1403   RDW 13.6 12/09/2014 1201   LYMPHSABS 1.8 12/07/2014 1403   MONOABS 0.5 12/07/2014 1403   EOSABS 0.0 12/07/2014 1403   BASOSABS 0.1 12/07/2014 1403   CMP     Component Value Date/Time   NA 141 12/04/2016 1020   K 4.2 12/04/2016 1020   CL 96 12/04/2016 1020   CO2 27 12/04/2016 1020   GLUCOSE 88 12/04/2016 1020   GLUCOSE 106 (H) 12/07/2014 1403   BUN 14 12/04/2016 1020   CREATININE 0.75 12/04/2016 1020   CALCIUM 10.0 12/04/2016 1020   PROT 8.1 12/07/2014 1403   ALBUMIN 4.6 12/04/2016 1020   AST 35 12/07/2014 1403   ALT 27 12/07/2014 1403   ALKPHOS 71 12/07/2014 1403   BILITOT 0.5 12/07/2014 1403   GFRNONAA 81 12/04/2016 1020   GFRAA 93 12/04/2016 1020    Imaging Studies: No results found.  Assessment and Plan:   Asante Ritacco  is a 73 y.o. y/o female has been referred for dysphasia with reflux symptoms.  The patient states that she is not being helped by the Protonix in regards to having food and liquid feeling that they are not going through her esophagus.  The patient is devoid of any worry symptoms such as unexplained weight loss, black stools, bloody stools or hematemesis.  The patient will be set up for an EGD and has been told to continue the pantoprazole which is likely helping any  further damage but is not alleviating her dysphasia.  She has been told that she may have an esophageal motility problem versus a stricture and this will be assessed during her upper endoscopy.  Follow Up Instructions:  I discussed the assessment and treatment plan with the patient. The patient was provided an opportunity to ask questions and all were answered. The patient agreed with the plan and demonstrated an understanding of the instructions.   The patient was advised to call back or seek an in-person evaluation if the symptoms worsen or if the condition fails to improve as anticipated.  I provided 10 minutes of non-face-to-face time during this encounter.   Emily Lame, MD  Speech recognition software was used to dictate the above note.

## 2018-06-03 ENCOUNTER — Telehealth: Payer: Self-pay

## 2018-06-03 NOTE — Telephone Encounter (Signed)
Left vm for pt to return my call to schedule EGD with Dr. Allen Norris.

## 2018-06-03 NOTE — Telephone Encounter (Signed)
-----   Message from [place], [place] sent at 06/02/2018 10:14 AM EDT ----- Schedule EGD for dysphagia/reflux. Call this afternoon.

## 2018-06-12 DIAGNOSIS — M5412 Radiculopathy, cervical region: Secondary | ICD-10-CM | POA: Diagnosis not present

## 2018-06-12 DIAGNOSIS — G8929 Other chronic pain: Secondary | ICD-10-CM | POA: Diagnosis not present

## 2018-06-12 DIAGNOSIS — M5030 Other cervical disc degeneration, unspecified cervical region: Secondary | ICD-10-CM | POA: Diagnosis not present

## 2018-06-12 DIAGNOSIS — M542 Cervicalgia: Secondary | ICD-10-CM | POA: Diagnosis not present

## 2018-06-18 DIAGNOSIS — M25511 Pain in right shoulder: Secondary | ICD-10-CM | POA: Diagnosis not present

## 2018-06-18 DIAGNOSIS — M25611 Stiffness of right shoulder, not elsewhere classified: Secondary | ICD-10-CM | POA: Diagnosis not present

## 2018-06-18 DIAGNOSIS — M436 Torticollis: Secondary | ICD-10-CM | POA: Diagnosis not present

## 2018-06-18 DIAGNOSIS — M5412 Radiculopathy, cervical region: Secondary | ICD-10-CM | POA: Diagnosis not present

## 2018-06-18 DIAGNOSIS — M5030 Other cervical disc degeneration, unspecified cervical region: Secondary | ICD-10-CM | POA: Diagnosis not present

## 2018-06-18 DIAGNOSIS — M542 Cervicalgia: Secondary | ICD-10-CM | POA: Diagnosis not present

## 2018-06-18 DIAGNOSIS — M6281 Muscle weakness (generalized): Secondary | ICD-10-CM | POA: Diagnosis not present

## 2018-06-23 DIAGNOSIS — M5412 Radiculopathy, cervical region: Secondary | ICD-10-CM | POA: Diagnosis not present

## 2018-06-23 DIAGNOSIS — M436 Torticollis: Secondary | ICD-10-CM | POA: Diagnosis not present

## 2018-06-23 DIAGNOSIS — M542 Cervicalgia: Secondary | ICD-10-CM | POA: Diagnosis not present

## 2018-06-23 DIAGNOSIS — M5030 Other cervical disc degeneration, unspecified cervical region: Secondary | ICD-10-CM | POA: Diagnosis not present

## 2018-06-23 DIAGNOSIS — M25511 Pain in right shoulder: Secondary | ICD-10-CM | POA: Diagnosis not present

## 2018-06-23 DIAGNOSIS — M6281 Muscle weakness (generalized): Secondary | ICD-10-CM | POA: Diagnosis not present

## 2018-06-25 DIAGNOSIS — M5412 Radiculopathy, cervical region: Secondary | ICD-10-CM | POA: Diagnosis not present

## 2018-06-25 DIAGNOSIS — M5030 Other cervical disc degeneration, unspecified cervical region: Secondary | ICD-10-CM | POA: Diagnosis not present

## 2018-06-25 DIAGNOSIS — M25611 Stiffness of right shoulder, not elsewhere classified: Secondary | ICD-10-CM | POA: Diagnosis not present

## 2018-06-25 DIAGNOSIS — M542 Cervicalgia: Secondary | ICD-10-CM | POA: Diagnosis not present

## 2018-06-25 DIAGNOSIS — M25511 Pain in right shoulder: Secondary | ICD-10-CM | POA: Diagnosis not present

## 2018-06-25 DIAGNOSIS — M436 Torticollis: Secondary | ICD-10-CM | POA: Diagnosis not present

## 2018-06-30 DIAGNOSIS — M5412 Radiculopathy, cervical region: Secondary | ICD-10-CM | POA: Diagnosis not present

## 2018-06-30 DIAGNOSIS — M436 Torticollis: Secondary | ICD-10-CM | POA: Diagnosis not present

## 2018-06-30 DIAGNOSIS — M542 Cervicalgia: Secondary | ICD-10-CM | POA: Diagnosis not present

## 2018-06-30 DIAGNOSIS — M25611 Stiffness of right shoulder, not elsewhere classified: Secondary | ICD-10-CM | POA: Diagnosis not present

## 2018-06-30 DIAGNOSIS — M25511 Pain in right shoulder: Secondary | ICD-10-CM | POA: Diagnosis not present

## 2018-06-30 DIAGNOSIS — M5030 Other cervical disc degeneration, unspecified cervical region: Secondary | ICD-10-CM | POA: Diagnosis not present

## 2018-06-30 DIAGNOSIS — M6281 Muscle weakness (generalized): Secondary | ICD-10-CM | POA: Diagnosis not present

## 2018-07-02 DIAGNOSIS — M25611 Stiffness of right shoulder, not elsewhere classified: Secondary | ICD-10-CM | POA: Diagnosis not present

## 2018-07-02 DIAGNOSIS — M436 Torticollis: Secondary | ICD-10-CM | POA: Diagnosis not present

## 2018-07-02 DIAGNOSIS — M25511 Pain in right shoulder: Secondary | ICD-10-CM | POA: Diagnosis not present

## 2018-07-02 DIAGNOSIS — M542 Cervicalgia: Secondary | ICD-10-CM | POA: Diagnosis not present

## 2018-07-02 DIAGNOSIS — M5030 Other cervical disc degeneration, unspecified cervical region: Secondary | ICD-10-CM | POA: Diagnosis not present

## 2018-07-02 DIAGNOSIS — M5412 Radiculopathy, cervical region: Secondary | ICD-10-CM | POA: Diagnosis not present

## 2018-07-02 DIAGNOSIS — M6281 Muscle weakness (generalized): Secondary | ICD-10-CM | POA: Diagnosis not present

## 2018-07-07 DIAGNOSIS — M542 Cervicalgia: Secondary | ICD-10-CM | POA: Diagnosis not present

## 2018-07-07 DIAGNOSIS — M6281 Muscle weakness (generalized): Secondary | ICD-10-CM | POA: Diagnosis not present

## 2018-07-07 DIAGNOSIS — M25611 Stiffness of right shoulder, not elsewhere classified: Secondary | ICD-10-CM | POA: Diagnosis not present

## 2018-07-07 DIAGNOSIS — M25511 Pain in right shoulder: Secondary | ICD-10-CM | POA: Diagnosis not present

## 2018-07-07 DIAGNOSIS — M436 Torticollis: Secondary | ICD-10-CM | POA: Diagnosis not present

## 2018-07-07 DIAGNOSIS — M5030 Other cervical disc degeneration, unspecified cervical region: Secondary | ICD-10-CM | POA: Diagnosis not present

## 2018-07-07 DIAGNOSIS — M5412 Radiculopathy, cervical region: Secondary | ICD-10-CM | POA: Diagnosis not present

## 2018-07-09 DIAGNOSIS — M25511 Pain in right shoulder: Secondary | ICD-10-CM | POA: Diagnosis not present

## 2018-07-09 DIAGNOSIS — M5412 Radiculopathy, cervical region: Secondary | ICD-10-CM | POA: Diagnosis not present

## 2018-07-09 DIAGNOSIS — M542 Cervicalgia: Secondary | ICD-10-CM | POA: Diagnosis not present

## 2018-07-09 DIAGNOSIS — M5030 Other cervical disc degeneration, unspecified cervical region: Secondary | ICD-10-CM | POA: Diagnosis not present

## 2018-07-09 DIAGNOSIS — M6281 Muscle weakness (generalized): Secondary | ICD-10-CM | POA: Diagnosis not present

## 2018-07-09 DIAGNOSIS — M436 Torticollis: Secondary | ICD-10-CM | POA: Diagnosis not present

## 2018-08-03 DIAGNOSIS — M5412 Radiculopathy, cervical region: Secondary | ICD-10-CM | POA: Diagnosis not present

## 2018-08-03 DIAGNOSIS — M5030 Other cervical disc degeneration, unspecified cervical region: Secondary | ICD-10-CM | POA: Diagnosis not present

## 2018-08-03 DIAGNOSIS — M542 Cervicalgia: Secondary | ICD-10-CM | POA: Diagnosis not present

## 2018-08-03 DIAGNOSIS — G8929 Other chronic pain: Secondary | ICD-10-CM | POA: Diagnosis not present

## 2018-08-04 ENCOUNTER — Other Ambulatory Visit: Payer: Self-pay | Admitting: Orthopedic Surgery

## 2018-08-04 DIAGNOSIS — M5412 Radiculopathy, cervical region: Secondary | ICD-10-CM

## 2018-08-05 DIAGNOSIS — Z20828 Contact with and (suspected) exposure to other viral communicable diseases: Secondary | ICD-10-CM | POA: Diagnosis not present

## 2018-08-18 ENCOUNTER — Other Ambulatory Visit: Payer: Self-pay

## 2018-08-18 ENCOUNTER — Ambulatory Visit
Admission: RE | Admit: 2018-08-18 | Discharge: 2018-08-18 | Disposition: A | Discharge status: Home / Self Care | Payer: Medicare HMO | Source: Ambulatory Visit | Attending: Orthopedic Surgery | Admitting: Orthopedic Surgery

## 2018-08-18 DIAGNOSIS — M5412 Radiculopathy, cervical region: Secondary | ICD-10-CM | POA: Insufficient documentation

## 2018-08-18 DIAGNOSIS — M47812 Spondylosis without myelopathy or radiculopathy, cervical region: Secondary | ICD-10-CM | POA: Diagnosis not present

## 2018-08-20 DIAGNOSIS — M4802 Spinal stenosis, cervical region: Secondary | ICD-10-CM | POA: Diagnosis not present

## 2018-08-20 DIAGNOSIS — M5030 Other cervical disc degeneration, unspecified cervical region: Secondary | ICD-10-CM | POA: Diagnosis not present

## 2018-08-20 DIAGNOSIS — M5412 Radiculopathy, cervical region: Secondary | ICD-10-CM | POA: Diagnosis not present

## 2018-09-14 DIAGNOSIS — M5412 Radiculopathy, cervical region: Secondary | ICD-10-CM | POA: Diagnosis not present

## 2018-09-14 DIAGNOSIS — M5030 Other cervical disc degeneration, unspecified cervical region: Secondary | ICD-10-CM | POA: Diagnosis not present

## 2018-09-14 DIAGNOSIS — M7541 Impingement syndrome of right shoulder: Secondary | ICD-10-CM | POA: Diagnosis not present

## 2018-10-12 ENCOUNTER — Encounter: Payer: Self-pay | Admitting: Family Medicine

## 2018-10-12 ENCOUNTER — Ambulatory Visit (INDEPENDENT_AMBULATORY_CARE_PROVIDER_SITE_OTHER): Payer: Medicare HMO | Admitting: Family Medicine

## 2018-10-12 ENCOUNTER — Other Ambulatory Visit: Payer: Self-pay

## 2018-10-12 VITALS — BP 140/90 | HR 64 | Ht 61.0 in | Wt 137.0 lb

## 2018-10-12 DIAGNOSIS — E782 Mixed hyperlipidemia: Secondary | ICD-10-CM | POA: Diagnosis not present

## 2018-10-12 DIAGNOSIS — Z0000 Encounter for general adult medical examination without abnormal findings: Secondary | ICD-10-CM

## 2018-10-12 DIAGNOSIS — E663 Overweight: Secondary | ICD-10-CM | POA: Diagnosis not present

## 2018-10-12 DIAGNOSIS — Z23 Encounter for immunization: Secondary | ICD-10-CM

## 2018-10-12 DIAGNOSIS — K219 Gastro-esophageal reflux disease without esophagitis: Secondary | ICD-10-CM | POA: Diagnosis not present

## 2018-10-12 DIAGNOSIS — I10 Essential (primary) hypertension: Secondary | ICD-10-CM

## 2018-10-12 MED ORDER — HYDROCHLOROTHIAZIDE 25 MG PO TABS: 25.0000 mg | ORAL_TABLET | Freq: Every day | ORAL | 1 refills | Status: DC

## 2018-10-12 MED ORDER — PANTOPRAZOLE SODIUM 40 MG PO TBEC: 40.0000 mg | DELAYED_RELEASE_TABLET | Freq: Every day | ORAL | 1 refills | Status: DC

## 2018-10-12 NOTE — Progress Notes (Signed)
Date:  10/12/2018   Name:  Emily Boyer   DOB:  November 17, 1945   MRN:  [id number]   Chief Complaint: Annual Exam and flu vacc need  Patient is a 73 year old female who presents for a comprehensive physical exam. The patient reports the following problems: hypertension check. Health maintenance has been reviewed influenza. Hypertension This is a chronic problem. The current episode started more than 1 year ago. The problem is unchanged. The problem is controlled. Pertinent negatives include no anxiety, blurred vision, chest pain, headaches, malaise/fatigue, neck pain, orthopnea, palpitations, peripheral edema, PND, shortness of breath or sweats. There are no associated agents to hypertension. There are no known risk factors for coronary artery disease. Past treatments include diuretics. The current treatment provides moderate improvement. There are no compliance problems.  There is no history of angina, kidney disease, CAD/MI, CVA, heart failure, left ventricular hypertrophy, PVD or retinopathy. There is no history of chronic renal disease, a hypertension causing med or renovascular disease.  Gastroesophageal Reflux She reports no abdominal pain, no belching, no chest pain, no choking, no coughing, no dysphagia, no early satiety, no globus sensation, no heartburn, no hoarse voice, no nausea, no sore throat, no stridor or no wheezing. This is a new problem. The current episode started more than 1 year ago. The problem occurs occasionally. The problem has been gradually improving. The symptoms are aggravated by certain foods. Pertinent negatives include no fatigue.    Review of Systems  Constitutional: Negative.  Negative for chills, fatigue, fever, malaise/fatigue and unexpected weight change.  HENT: Negative for congestion, ear discharge, ear pain, hoarse voice, rhinorrhea, sinus pressure, sneezing and sore throat.   Eyes: Negative for blurred vision, photophobia, pain, discharge, redness and  itching.  Respiratory: Negative for cough, choking, shortness of breath, wheezing and stridor.   Cardiovascular: Negative for chest pain, palpitations, orthopnea and PND.  Gastrointestinal: Negative for abdominal pain, blood in stool, constipation, diarrhea, dysphagia, heartburn, nausea and vomiting.  Endocrine: Negative for cold intolerance, heat intolerance, polydipsia, polyphagia and polyuria.  Genitourinary: Negative for dysuria, flank pain, frequency, hematuria, menstrual problem, pelvic pain, urgency, vaginal bleeding and vaginal discharge.  Musculoskeletal: Negative for arthralgias, back pain, myalgias and neck pain.  Skin: Negative for rash.  Allergic/Immunologic: Negative for environmental allergies and food allergies.  Neurological: Negative for dizziness, weakness, light-headedness, numbness and headaches.  Hematological: Negative for adenopathy. Does not bruise/bleed easily.  Psychiatric/Behavioral: Negative for dysphoric mood. The patient is not nervous/anxious.     Patient Active Problem List   Diagnosis Date Noted  . DDD (degenerative disc disease), cervical 05/28/2018  . Sebaceous cyst 12/20/2015  . Abnormal findings-gastrointestinal tract   . Encounter for general adult medical examination without abnormal findings 07/04/2014  . Nerve root pain 07/04/2014  . Gastroesophageal reflux disease 07/04/2014  . HLD (hyperlipidemia) 07/04/2014  . Hypertension 07/04/2014    Allergies  Allergen Reactions  . Guaifenesin & Derivatives   . Methocarbamol Itching  . Penicillins Itching  . Tramadol Hcl Itching    Past Surgical History:  Procedure Laterality Date  . BREAST BIOPSY Right 2004   neg  . CATARACT EXTRACTION W/PHACO Right 04/29/2017   Procedure: CATARACT EXTRACTION PHACO AND INTRAOCULAR LENS PLACEMENT ([facility]) RIGHT;  Surgeon: Eulogio Bear, MD;  Location: [facility];  Service: Ophthalmology;  Laterality: Right;  . CATARACT EXTRACTION W/PHACO Left 07/15/2017    Procedure: CATARACT EXTRACTION PHACO AND INTRAOCULAR LENS PLACEMENT ([place]) LEFT;  Surgeon: Eulogio Bear, MD;  Location:  [facility];  Service: Ophthalmology;  Laterality: Left;  . COLONOSCOPY WITH PROPOFOL N/A 02/06/2015   Procedure: COLONOSCOPY WITH PROPOFOL;  Surgeon: [name], MD;  Location: [facility];  Service: Endoscopy;  Laterality: N/A;  . TUBAL LIGATION  1980s  . VAGINAL HYSTERECTOMY  1990s   one ovary removed as well    Social History   Tobacco Use  . Smoking status: Former Smoker    Packs/day: 0.50    Years: 50.00    Pack years: 25.00    Quit date: 11/22/2011    Years since quitting: 6.8  . Smokeless tobacco: Never Used  . Tobacco comment: quit 2014  Substance Use Topics  . Alcohol use: Yes    Alcohol/week: 2.0 standard drinks    Types: 2 Glasses of wine per week  . Drug use: No     Medication list has been reviewed and updated.  Current Meds  Medication Sig  . aspirin EC 81 MG tablet Take by mouth.  . calcium carbonate (OS-CAL - DOSED IN MG OF ELEMENTAL CALCIUM) 1250 (500 Ca) MG tablet Take 1 tablet by mouth.  . cholecalciferol (VITAMIN D) 1000 units tablet Take 1,000 Units by mouth daily.  Emily Boyer Kitchen gabapentin (NEURONTIN) 300 MG capsule Take by mouth. Emily Boyer  . hydrochlorothiazide (HYDRODIURIL) 25 MG tablet Take 1 tablet (25 mg total) by mouth daily.  . meloxicam (MOBIC) 15 MG tablet Take 1 tablet by mouth daily. Emily Boyer  . pantoprazole (PROTONIX) 40 MG tablet Take 1 tablet (40 mg total) by mouth daily.  Emily Boyer Kitchen tiZANidine (ZANAFLEX) 4 MG tablet Emily Boyer  . traMADol (ULTRAM) 50 MG tablet Emily Boyer    PHQ 2/9 Scores 10/12/2018 05/26/2018 12/04/2016 07/17/2015  PHQ - 2 Score 0 0 0 0  PHQ- 9 Score 0 0 - -    BP Readings from Last 3 Encounters:  10/12/18 (!) 140/100  05/26/18 132/70  01/29/18 (!) 145/91    Physical Exam Vitals signs and nursing note reviewed.  Constitutional:      Appearance: She is well-developed.  HENT:     Head: Normocephalic.      Right Ear: Tympanic membrane, ear canal and external ear normal.     Left Ear: Tympanic membrane, ear canal and external ear normal.  Eyes:     General: Lids are everted, no foreign bodies appreciated. No scleral icterus.       Left eye: No foreign body or hordeolum.     Conjunctiva/sclera: Conjunctivae normal.     Right eye: Right conjunctiva is not injected.     Left eye: Left conjunctiva is not injected.     Pupils: Pupils are equal, round, and reactive to light.  Neck:     Musculoskeletal: Normal range of motion and neck supple.     Thyroid: No thyromegaly.     Vascular: No JVD.     Trachea: No tracheal deviation.  Cardiovascular:     Rate and Rhythm: Normal rate and regular rhythm.     Heart sounds: Normal heart sounds. No murmur. No friction rub. No gallop.   Pulmonary:     Effort: Pulmonary effort is normal. No respiratory distress.     Breath sounds: Normal breath sounds. No wheezing or rales.  Abdominal:     General: Bowel sounds are normal.     Palpations: Abdomen is soft. There is no mass.     Tenderness: There is no abdominal tenderness. There is no guarding or rebound.  Musculoskeletal: Normal range of motion.  General: No tenderness.  Lymphadenopathy:     Cervical: No cervical adenopathy.  Skin:    General: Skin is warm.     Findings: No rash.  Neurological:     Mental Status: She is alert and oriented to person, place, and time.     Cranial Nerves: No cranial nerve deficit.     Deep Tendon Reflexes: Reflexes normal.  Psychiatric:        Mood and Affect: Mood is not anxious or depressed.     Wt Readings from Last 3 Encounters:  10/12/18 137 lb (62.1 kg)  05/26/18 150 lb (68 kg)  01/29/18 146 lb (66.2 kg)    BP (!) 140/100   Pulse 64   Ht 5\' 1"  (1.549 m)   Wt 137 lb (62.1 kg)   BMI 25.89 kg/m   Assessment and Plan:  1. Annual physical exam No subjective objective concerns noted on physical history exam.  Patient's previous encounters, labs,  and imaging noted.Montserrath Ezola Gajda is a 73 y.o. female who presents today for her Complete Annual Exam. She feels well. She reports exercising . She reports she is sleeping well. Immunizations are reviewed and recommendations provided.   Age appropriate screening tests are discussed. Counseling given for risk factor reduction interventions. - Renal Function Panel - Lipid Panel With LDL/HDL Ratio  2. Essential hypertension Chronic.  Controlled.  Continue hydrochlorothiazide 25 mg.  On recheck patient's blood pressure came down but still somewhat elevated at 140/90.  Rather than starting a new medication along with what she is taking I would prefer to reemphasized DASH diet and recheck her in 3 months. - Renal Function Panel - hydrochlorothiazide (HYDRODIURIL) 25 MG tablet; Take 1 tablet (25 mg total) by mouth daily.  Dispense: 90 tablet; Refill: 1  3. Gastroesophageal reflux disease, esophagitis presence not specified Chronic.  Controlled.  Continue pantoprazole 40 mg once a day. - pantoprazole (PROTONIX) 40 MG tablet; Take 1 tablet (40 mg total) by mouth daily.  Dispense: 90 tablet; Refill: 1  4. Overweight (BMI 25.0-29.9) Health risks of being over weight were discussed and patient was counseled on weight loss options and exercise.  5. Influenza vaccine needed Discussed and administered - Flu Vaccine QUAD High Dose(Fluad)  6. Mixed hyperlipidemia Patient has history of mixed hyperlipidemia we will check lipid panel and if necessary will initiate statin. - Lipid Panel With LDL/HDL Ratio  7. Gastroesophageal reflux disease, esophagitis presence not specified Continue pantoprazole 40 mg once a day. - pantoprazole (PROTONIX) 40 MG tablet; Take 1 tablet (40 mg total) by mouth daily.  Dispense: 90 tablet; Refill: 1  8. Essential hypertension This is a duplicate as noted above in this regard. - Renal Function Panel - hydrochlorothiazide (HYDRODIURIL) 25 MG tablet; Take 1 tablet (25 mg  total) by mouth daily.  Dispense: 90 tablet; Refill: 1

## 2018-10-12 NOTE — Patient Instructions (Signed)

## 2018-10-13 LAB — RENAL FUNCTION PANEL
Albumin: 4.6 g/dL (ref 3.7–4.7)
BUN/Creatinine Ratio: 19 (ref 12–28)
BUN: 22 mg/dL (ref 8–27)
CO2: 28 mmol/L (ref 20–29)
Calcium: 10.2 mg/dL (ref 8.7–10.3)
Chloride: 99 mmol/L (ref 96–106)
Creatinine, Ser: 1.16 mg/dL — ABNORMAL HIGH (ref 0.57–1.00)
GFR calc Af Amer: 55 mL/min/{1.73_m2} — ABNORMAL LOW (ref >59)
GFR calc non Af Amer: 47 mL/min/{1.73_m2} — ABNORMAL LOW (ref >59)
Glucose: 101 mg/dL — ABNORMAL HIGH (ref 65–99)
Phosphorus: 3.9 mg/dL (ref 3.0–4.3)
Potassium: 4.1 mmol/L (ref 3.5–5.2)
Sodium: 142 mmol/L (ref 134–144)

## 2018-10-13 LAB — LIPID PANEL WITH LDL/HDL RATIO
Cholesterol, Total: 268 mg/dL — ABNORMAL HIGH (ref 100–199)
HDL: 53 mg/dL (ref >39)
LDL Chol Calc (NIH): 190 mg/dL — ABNORMAL HIGH (ref 0–99)
LDL/HDL Ratio: 3.6 ratio — ABNORMAL HIGH (ref 0.0–3.2)
Triglycerides: 138 mg/dL (ref 0–149)
VLDL Cholesterol Cal: 25 mg/dL (ref 5–40)

## 2018-10-15 ENCOUNTER — Other Ambulatory Visit: Payer: Self-pay

## 2018-10-15 DIAGNOSIS — E782 Mixed hyperlipidemia: Secondary | ICD-10-CM

## 2018-10-15 MED ORDER — ATORVASTATIN CALCIUM 10 MG PO TABS: 10.0000 mg | ORAL_TABLET | Freq: Every day | ORAL | 2 refills | Status: DC

## 2018-10-15 NOTE — Progress Notes (Unsigned)
atorv sent into [facility]

## 2018-10-22 ENCOUNTER — Ambulatory Visit
Admission: RE | Admit: 2018-10-22 | Discharge: 2018-10-22 | Disposition: A | Discharge status: Home / Self Care | Payer: Medicare HMO | Source: Ambulatory Visit | Attending: Family Medicine | Admitting: Family Medicine

## 2018-10-22 ENCOUNTER — Other Ambulatory Visit: Payer: Self-pay

## 2018-10-22 DIAGNOSIS — Z1231 Encounter for screening mammogram for malignant neoplasm of breast: Secondary | ICD-10-CM | POA: Insufficient documentation

## 2018-11-11 ENCOUNTER — Encounter: Payer: Self-pay | Admitting: Family Medicine

## 2018-11-11 DIAGNOSIS — Z791 Long term (current) use of non-steroidal anti-inflammatories (NSAID): Secondary | ICD-10-CM | POA: Diagnosis not present

## 2018-11-11 DIAGNOSIS — Z87891 Personal history of nicotine dependence: Secondary | ICD-10-CM | POA: Diagnosis not present

## 2018-11-11 DIAGNOSIS — G4700 Insomnia, unspecified: Secondary | ICD-10-CM | POA: Diagnosis not present

## 2018-11-11 DIAGNOSIS — E785 Hyperlipidemia, unspecified: Secondary | ICD-10-CM | POA: Diagnosis not present

## 2018-11-11 DIAGNOSIS — Z79891 Long term (current) use of opiate analgesic: Secondary | ICD-10-CM | POA: Diagnosis not present

## 2018-11-11 DIAGNOSIS — I10 Essential (primary) hypertension: Secondary | ICD-10-CM | POA: Diagnosis not present

## 2018-11-11 DIAGNOSIS — G8929 Other chronic pain: Secondary | ICD-10-CM | POA: Diagnosis not present

## 2018-11-11 DIAGNOSIS — Z880 Allergy status to penicillin: Secondary | ICD-10-CM | POA: Diagnosis not present

## 2018-11-11 DIAGNOSIS — K219 Gastro-esophageal reflux disease without esophagitis: Secondary | ICD-10-CM | POA: Diagnosis not present

## 2018-12-14 ENCOUNTER — Other Ambulatory Visit: Payer: Self-pay

## 2018-12-14 ENCOUNTER — Ambulatory Visit: Admission: EM | Admit: 2018-12-14 | Discharge: 2018-12-14 | Discharge status: Absent without leave | Payer: Medicare HMO

## 2018-12-14 DIAGNOSIS — Z20822 Contact with and (suspected) exposure to covid-19: Secondary | ICD-10-CM

## 2018-12-15 LAB — NOVEL CORONAVIRUS, NAA: SARS-CoV-2, NAA: NOT DETECTED

## 2018-12-23 DIAGNOSIS — M7541 Impingement syndrome of right shoulder: Secondary | ICD-10-CM | POA: Diagnosis not present

## 2018-12-23 DIAGNOSIS — M5030 Other cervical disc degeneration, unspecified cervical region: Secondary | ICD-10-CM | POA: Diagnosis not present

## 2018-12-23 DIAGNOSIS — M5412 Radiculopathy, cervical region: Secondary | ICD-10-CM | POA: Diagnosis not present

## 2019-01-12 ENCOUNTER — Encounter: Payer: Self-pay | Admitting: Family Medicine

## 2019-01-12 ENCOUNTER — Ambulatory Visit (INDEPENDENT_AMBULATORY_CARE_PROVIDER_SITE_OTHER): Payer: Medicare HMO | Admitting: Family Medicine

## 2019-01-12 ENCOUNTER — Other Ambulatory Visit: Payer: Self-pay

## 2019-01-12 VITALS — BP 138/100 | HR 64 | Ht 61.0 in | Wt 134.0 lb

## 2019-01-12 DIAGNOSIS — R634 Abnormal weight loss: Secondary | ICD-10-CM

## 2019-01-12 DIAGNOSIS — E782 Mixed hyperlipidemia: Secondary | ICD-10-CM | POA: Diagnosis not present

## 2019-01-12 DIAGNOSIS — K219 Gastro-esophageal reflux disease without esophagitis: Secondary | ICD-10-CM | POA: Diagnosis not present

## 2019-01-12 DIAGNOSIS — I10 Essential (primary) hypertension: Secondary | ICD-10-CM

## 2019-01-12 LAB — POCT URINALYSIS DIPSTICK
Bilirubin, UA: NEGATIVE
Blood, UA: NEGATIVE
Glucose, UA: NEGATIVE
Ketones, UA: NEGATIVE
Leukocytes, UA: NEGATIVE
Nitrite, UA: NEGATIVE
Protein, UA: NEGATIVE
Spec Grav, UA: 1.01 (ref 1.010–1.025)
Urobilinogen, UA: 0.2 E.U./dL
pH, UA: 5 (ref 5.0–8.0)

## 2019-01-12 MED ORDER — HYDROCHLOROTHIAZIDE 25 MG PO TABS: 25.0000 mg | ORAL_TABLET | Freq: Every day | ORAL | 1 refills | Status: DC

## 2019-01-12 MED ORDER — LISINOPRIL 5 MG PO TABS: 5.0000 mg | ORAL_TABLET | Freq: Every day | ORAL | 0 refills | Status: DC

## 2019-01-12 MED ORDER — PANTOPRAZOLE SODIUM 40 MG PO TBEC: 40.0000 mg | DELAYED_RELEASE_TABLET | Freq: Every day | ORAL | 1 refills | Status: DC

## 2019-01-12 MED ORDER — ATORVASTATIN CALCIUM 10 MG PO TABS: 10.0000 mg | ORAL_TABLET | Freq: Every day | ORAL | 1 refills | Status: DC

## 2019-01-12 NOTE — Progress Notes (Signed)
Date:  01/12/2019   Name:  Emily Boyer   DOB:  [birth year]   MRN:  [id number]   Chief Complaint: Hypertension and Weight Loss (lost 20lbs since April)  Patient with unintentional weight loss.  Hypertension This is a chronic problem. The current episode started more than 1 year ago. The problem has been waxing and waning since onset. The problem is controlled. Pertinent negatives include no anxiety, blurred vision, chest pain, headaches, malaise/fatigue, neck pain, orthopnea, palpitations, peripheral edema, PND, shortness of breath or sweats. There are no associated agents to hypertension. There are no known risk factors for coronary artery disease. Past treatments include diuretics. The current treatment provides no improvement. There are no compliance problems.  There is no history of angina, kidney disease, CAD/MI, CVA, heart failure, left ventricular hypertrophy, PVD or retinopathy. There is no history of chronic renal disease, a hypertension causing med or renovascular disease.  Gastroesophageal Reflux She reports no abdominal pain, no chest pain, no choking, no coughing, no dysphagia, no heartburn, no nausea, no sore throat or no wheezing. This is a chronic problem. The current episode started more than 1 year ago. The problem has been unchanged. The symptoms are aggravated by certain foods. Pertinent negatives include no anemia, fatigue, melena, muscle weakness, orthopnea or weight loss. There are no known risk factors. She has tried a PPI (pantoprazole) for the symptoms. The treatment provided moderate relief.  Hyperlipidemia This is a chronic problem. The current episode started more than 1 year ago. The problem is controlled. Recent lipid tests were reviewed and are normal. She has no history of chronic renal disease, diabetes, hypothyroidism, liver disease, obesity or nephrotic syndrome. Factors aggravating her hyperlipidemia include thiazides. Pertinent negatives include no chest  pain, focal sensory loss, focal weakness, leg pain, myalgias or shortness of breath. Current antihyperlipidemic treatment includes statins. The current treatment provides moderate improvement of lipids. There are no compliance problems.  Risk factors for coronary artery disease include hypertension and dyslipidemia.    Lab Results  Component Value Date   CREATININE 1.16 (H) 10/12/2018   BUN 22 10/12/2018   NA 142 10/12/2018   K 4.1 10/12/2018   CL 99 10/12/2018   CO2 28 10/12/2018   Lab Results  Component Value Date   CHOL 268 (H) 10/12/2018   HDL 53 10/12/2018   LDLCALC 190 (H) 10/12/2018   TRIG 138 10/12/2018   CHOLHDL 5.3 (H) 12/04/2016   No results found for: TSH No results found for: HGBA1C   Review of Systems  Constitutional: Negative.  Negative for chills, fatigue, fever, malaise/fatigue, unexpected weight change and weight loss.  HENT: Negative for congestion, ear discharge, ear pain, rhinorrhea, sinus pressure, sneezing and sore throat.   Eyes: Negative for blurred vision, photophobia, pain, discharge, redness and itching.  Respiratory: Negative for cough, choking, shortness of breath, wheezing and stridor.   Cardiovascular: Negative for chest pain, palpitations, orthopnea and PND.  Gastrointestinal: Negative for abdominal pain, blood in stool, constipation, diarrhea, dysphagia, heartburn, melena, nausea and vomiting.  Endocrine: Negative for cold intolerance, heat intolerance, polydipsia, polyphagia and polyuria.  Genitourinary: Negative for dysuria, flank pain, frequency, hematuria, menstrual problem, pelvic pain, urgency, vaginal bleeding and vaginal discharge.  Musculoskeletal: Negative for arthralgias, back pain, myalgias, muscle weakness and neck pain.  Skin: Negative for rash.  Allergic/Immunologic: Negative for environmental allergies and food allergies.  Neurological: Negative for dizziness, focal weakness, weakness, light-headedness, numbness and headaches.    Hematological: Negative for adenopathy. Does not bruise/bleed  easily.  Psychiatric/Behavioral: Negative for dysphoric mood. The patient is not nervous/anxious.     Patient Active Problem List   Diagnosis Date Noted  . DDD (degenerative disc disease), cervical 05/28/2018  . Sebaceous cyst 12/20/2015  . Abnormal findings-gastrointestinal tract   . Encounter for general adult medical examination without abnormal findings 07/04/2014  . Nerve root pain 07/04/2014  . Gastroesophageal reflux disease 07/04/2014  . HLD (hyperlipidemia) 07/04/2014  . Hypertension 07/04/2014    Allergies  Allergen Reactions  . Guaifenesin & Derivatives   . Methocarbamol Itching  . Penicillins Itching  . Tramadol Hcl Itching    Past Surgical History:  Procedure Laterality Date  . BREAST BIOPSY Right 2004   neg  . CATARACT EXTRACTION W/PHACO Right 04/29/2017   Procedure: CATARACT EXTRACTION PHACO AND INTRAOCULAR LENS PLACEMENT ([facility]) RIGHT;  Surgeon: Eulogio Bear, MD;  Location: [facility];  Service: Ophthalmology;  Laterality: Right;  . CATARACT EXTRACTION W/PHACO Left 07/15/2017   Procedure: CATARACT EXTRACTION PHACO AND INTRAOCULAR LENS PLACEMENT ([place]) LEFT;  Surgeon: Eulogio Bear, MD;  Location: [facility];  Service: Ophthalmology;  Laterality: Left;  . COLONOSCOPY WITH PROPOFOL N/A 02/06/2015   Procedure: COLONOSCOPY WITH PROPOFOL;  Surgeon: [name], MD;  Location: [facility];  Service: Endoscopy;  Laterality: N/A;  . TUBAL LIGATION  1980s  . VAGINAL HYSTERECTOMY  1990s   one ovary removed as well    Social History   Tobacco Use  . Smoking status: Former Smoker    Packs/day: 0.50    Years: 50.00    Pack years: 25.00    Quit date: 11/22/2011    Years since quitting: 7.1  . Smokeless tobacco: Never Used  . Tobacco comment: quit 2014  Substance Use Topics  . Alcohol use: Yes    Alcohol/week: 2.0 standard drinks    Types: 2 Glasses of wine per week  .  Drug use: No     Medication list has been reviewed and updated.  Current Meds  Medication Sig  . aspirin EC 81 MG tablet Take by mouth.  Marland Kitchen atorvastatin (LIPITOR) 10 MG tablet Take 1 tablet (10 mg total) by mouth daily.  . cholecalciferol (VITAMIN D) 1000 units tablet Take 1,000 Units by mouth daily.  . hydrochlorothiazide (HYDRODIURIL) 25 MG tablet Take 1 tablet (25 mg total) by mouth daily.  . Omega 3 1000 MG CAPS Take 1 capsule by mouth daily.  . pantoprazole (PROTONIX) 40 MG tablet Take 1 tablet (40 mg total) by mouth daily.    PHQ 2/9 Scores 10/12/2018 05/26/2018 12/04/2016 07/17/2015  PHQ - 2 Score 0 0 0 0  PHQ- 9 Score 0 0 - -    BP Readings from Last 3 Encounters:  01/12/19 (!) 138/100  10/12/18 140/90  05/26/18 132/70    Physical Exam Vitals and nursing note reviewed.  Constitutional:      Appearance: She is well-developed.  HENT:     Head: Normocephalic.     Right Ear: Tympanic membrane, ear canal and external ear normal.     Left Ear: Tympanic membrane, ear canal and external ear normal.     Nose: Nose normal.     Mouth/Throat:     Mouth: Mucous membranes are moist.     Pharynx: Oropharynx is clear.  Eyes:     General: Lids are everted, no foreign bodies appreciated. No scleral icterus.       Left eye: No foreign body or hordeolum.     Extraocular Movements:  Extraocular movements intact.     Conjunctiva/sclera: Conjunctivae normal.     Right eye: Right conjunctiva is not injected.     Left eye: Left conjunctiva is not injected.     Pupils: Pupils are equal, round, and reactive to light.  Neck:     Thyroid: No thyromegaly.     Vascular: No carotid bruit or JVD.     Trachea: No tracheal deviation.  Cardiovascular:     Rate and Rhythm: Normal rate and regular rhythm.     Heart sounds: Normal heart sounds. No murmur. No friction rub. No gallop.   Pulmonary:     Effort: Pulmonary effort is normal. No respiratory distress.     Breath sounds: Normal breath  sounds. No wheezing or rales.  Abdominal:     General: Bowel sounds are normal.     Palpations: Abdomen is soft. There is no mass.     Tenderness: There is no abdominal tenderness. There is no guarding or rebound.  Musculoskeletal:        General: No tenderness. Normal range of motion.     Cervical back: Normal range of motion and neck supple.  Lymphadenopathy:     Cervical: No cervical adenopathy.  Skin:    General: Skin is warm.     Findings: No rash.  Neurological:     Mental Status: She is alert and oriented to person, place, and time.     Cranial Nerves: No cranial nerve deficit.     Deep Tendon Reflexes: Reflexes normal.  Psychiatric:        Mood and Affect: Mood is not anxious or depressed.     Wt Readings from Last 3 Encounters:  01/12/19 134 lb (60.8 kg)  10/12/18 137 lb (62.1 kg)  05/26/18 150 lb (68 kg)    BP (!) 138/100   Pulse 64   Ht 5\' 1"  (1.549 m)   Wt 134 lb (60.8 kg)   BMI 25.32 kg/m   Assessment and Plan:  1. Essential hypertension Chronic.  Controlled.  Stable.  Will continue hydrochlorothiazide 25 mg as well as adding lisinopril 5 mg once a day.  Will check renal function panel as well as a point-of-care urinalysis. - hydrochlorothiazide (HYDRODIURIL) 25 MG tablet; Take 1 tablet (25 mg total) by mouth daily.  Dispense: 90 tablet; Refill: 1 - Renal function panel - POCT urinalysis dipstick - lisinopril (ZESTRIL) 5 MG tablet; Take 1 tablet (5 mg total) by mouth daily.  Dispense: 60 tablet; Refill: 0  2. Gastroesophageal reflux disease, unspecified whether esophagitis present Chronic.  Controlled.  Stable.  Continue pantoprazole 40 mg once a day. - pantoprazole (PROTONIX) 40 MG tablet; Take 1 tablet (40 mg total) by mouth daily.  Dispense: 90 tablet; Refill: 1  3. Mixed hyperlipidemia Chronic.  Controlled.  Stable.  Reviewed previous lipid panel which is in wonderful range.  Will continue atorvastatin 10 mg once a day. - atorvastatin (LIPITOR) 10 MG  tablet; Take 1 tablet (10 mg total) by mouth daily.  Dispense: 90 tablet; Refill: 1  4. Weight loss, non-intentional Patient has had a weight loss over the last 6 months.  With an additional 3 pounds over the last 3 months.  Patient has not been trying to lose weight.  We will initiate evaluation with CBC renal function panel hepatic function panel and TSH. - CBC with Differential/Platelet - Renal function panel - Thyroid Panel With TSH - Hepatic Function Panel (6)

## 2019-01-13 LAB — HEPATIC FUNCTION PANEL (6)
ALT: 20 IU/L (ref 0–32)
AST: 22 IU/L (ref 0–40)
Alkaline Phosphatase: 84 IU/L (ref 39–117)
Bilirubin Total: 0.3 mg/dL (ref 0.0–1.2)
Bilirubin, Direct: 0.11 mg/dL (ref 0.00–0.40)

## 2019-01-13 LAB — CBC WITH DIFFERENTIAL/PLATELET
Basophils Absolute: 0 10*3/uL (ref 0.0–0.2)
Basos: 0 %
EOS (ABSOLUTE): 0.1 10*3/uL (ref 0.0–0.4)
Eos: 1 %
Hematocrit: 41.3 % (ref 34.0–46.6)
Hemoglobin: 13.9 g/dL (ref 11.1–15.9)
Immature Grans (Abs): 0 10*3/uL (ref 0.0–0.1)
Immature Granulocytes: 0 %
Lymphocytes Absolute: 4 10*3/uL — ABNORMAL HIGH (ref 0.7–3.1)
Lymphs: 36 %
MCH: 29.6 pg (ref 26.6–33.0)
MCHC: 33.7 g/dL (ref 31.5–35.7)
MCV: 88 fL (ref 79–97)
Monocytes Absolute: 0.7 10*3/uL (ref 0.1–0.9)
Monocytes: 7 %
Neutrophils Absolute: 6.2 10*3/uL (ref 1.4–7.0)
Neutrophils: 56 %
Platelets: 335 10*3/uL (ref 150–450)
RBC: 4.7 x10E6/uL (ref 3.77–5.28)
RDW: 13.4 % (ref 11.7–15.4)
WBC: 11 10*3/uL — ABNORMAL HIGH (ref 3.4–10.8)

## 2019-01-13 LAB — RENAL FUNCTION PANEL
Albumin: 4.9 g/dL — ABNORMAL HIGH (ref 3.7–4.7)
BUN/Creatinine Ratio: 22 (ref 12–28)
BUN: 19 mg/dL (ref 8–27)
CO2: 29 mmol/L (ref 20–29)
Calcium: 10.4 mg/dL — ABNORMAL HIGH (ref 8.7–10.3)
Chloride: 95 mmol/L — ABNORMAL LOW (ref 96–106)
Creatinine, Ser: 0.85 mg/dL (ref 0.57–1.00)
GFR calc Af Amer: 79 mL/min/{1.73_m2} (ref >59)
GFR calc non Af Amer: 69 mL/min/{1.73_m2} (ref >59)
Glucose: 90 mg/dL (ref 65–99)
Phosphorus: 3.8 mg/dL (ref 3.0–4.3)
Potassium: 3.8 mmol/L (ref 3.5–5.2)
Sodium: 137 mmol/L (ref 134–144)

## 2019-01-13 LAB — THYROID PANEL WITH TSH
Free Thyroxine Index: 1.9 (ref 1.2–4.9)
T3 Uptake Ratio: 25 % (ref 24–39)
T4, Total: 7.6 ug/dL (ref 4.5–12.0)
TSH: 1.15 u[IU]/mL (ref 0.450–4.500)

## 2019-02-08 ENCOUNTER — Ambulatory Visit (INDEPENDENT_AMBULATORY_CARE_PROVIDER_SITE_OTHER): Payer: Medicare HMO

## 2019-02-08 ENCOUNTER — Other Ambulatory Visit: Payer: Self-pay

## 2019-02-08 VITALS — BP 132/76 | HR 61 | Resp 16 | Ht 61.0 in | Wt 140.4 lb

## 2019-02-08 DIAGNOSIS — Z0000 Encounter for general adult medical examination without abnormal findings: Secondary | ICD-10-CM | POA: Diagnosis not present

## 2019-02-08 NOTE — Patient Instructions (Signed)
[name] , Thank you for taking time to come for your Medicare Wellness Visit. I appreciate your ongoing commitment to your health goals. Please review the following plan we discussed and let me know if I can assist you in the future.   Screening recommendations/referrals: Colonoscopy: done 02/06/15 Mammogram: done 10/22/18 Bone Density: done 01/01/18 Recommended yearly ophthalmology/optometry visit for glaucoma screening and checkup Recommended yearly dental visit for hygiene and checkup  Vaccinations: Influenza vaccine: done 10/12/18 Pneumococcal vaccine: done 12/13/15 Tdap vaccine: done 12/07/15 Shingles vaccine: Shingrix discussed. Please contact your pharmacy for coverage information.   Advanced directives: Advance directive discussed with you today. I have provided a copy for you to complete at home and have notarized. Once this is complete please bring a copy in to our office so we can scan it into your chart.  Conditions/risks identified: Keep up the great work!  Next appointment: Please follow up in one year for your Medicare Annual Wellness visit.     Preventive Care 73 Years and Older, Female Preventive care refers to lifestyle choices and visits with your health care provider that can promote health and wellness. What does preventive care include?  A yearly physical exam. This is also called an annual well check.  Dental exams once or twice a year.  Routine eye exams. Ask your health care provider how often you should have your eyes checked.  Personal lifestyle choices, including:  Daily care of your teeth and gums.  Regular physical activity.  Eating a healthy diet.  Avoiding tobacco and drug use.  Limiting alcohol use.  Practicing safe sex.  Taking low-dose aspirin every day.  Taking vitamin and mineral supplements as recommended by your health care provider. What happens during an annual well check? The services and screenings done by your health  care provider during your annual well check will depend on your age, overall health, lifestyle risk factors, and family history of disease. Counseling  Your health care provider may ask you questions about your:  Alcohol use.  Tobacco use.  Drug use.  Emotional well-being.  Home and relationship well-being.  Sexual activity.  Eating habits.  History of falls.  Memory and ability to understand (cognition).  Work and work Statistician.  Reproductive health. Screening  You may have the following tests or measurements:  Height, weight, and BMI.  Blood pressure.  Lipid and cholesterol levels. These may be checked every 5 years, or more frequently if you are over 79 years old.  Skin check.  Lung cancer screening. You may have this screening every year starting at age 53 if you have a 30-pack-year history of smoking and currently smoke or have quit within the past 15 years.  Fecal occult blood test (FOBT) of the stool. You may have this test every year starting at age 7.  Flexible sigmoidoscopy or colonoscopy. You may have a sigmoidoscopy every 5 years or a colonoscopy every 10 years starting at age 39.  Hepatitis C blood test.  Hepatitis B blood test.  Sexually transmitted disease (STD) testing.  Diabetes screening. This is done by checking your blood sugar (glucose) after you have not eaten for a while (fasting). You may have this done every 1-3 years.  Bone density scan. This is done to screen for osteoporosis. You may have this done starting at age 48.  Mammogram. This may be done every 1-2 years. Talk to your health care provider about how often you should have regular mammograms. Talk with your health care provider  about your test results, treatment options, and if necessary, the need for more tests. Vaccines  Your health care provider may recommend certain vaccines, such as:  Influenza vaccine. This is recommended every year.  Tetanus, diphtheria, and  acellular pertussis (Tdap, Td) vaccine. You may need a Td booster every 10 years.  Zoster vaccine. You may need this after age 33.  Pneumococcal 13-valent conjugate (PCV13) vaccine. One dose is recommended after age 45.  Pneumococcal polysaccharide (PPSV23) vaccine. One dose is recommended after age 27. Talk to your health care provider about which screenings and vaccines you need and how often you need them. This information is not intended to replace advice given to you by your health care provider. Make sure you discuss any questions you have with your health care provider. Document Released: 02/03/2015 Document Revised: 09/27/2015 Document Reviewed: 11/08/2014 Elsevier Interactive Patient Education  2017 [place] Prevention in the Home Falls can cause injuries. They can happen to people of all ages. There are many things you can do to make your home safe and to help prevent falls. What can I do on the outside of my home?  Regularly fix the edges of walkways and driveways and fix any cracks.  Remove anything that might make you trip as you walk through a door, such as a raised step or threshold.  Trim any bushes or trees on the path to your home.  Use bright outdoor lighting.  Clear any walking paths of anything that might make someone trip, such as rocks or tools.  Regularly check to see if handrails are loose or broken. Make sure that both sides of any steps have handrails.  Any raised decks and porches should have guardrails on the edges.  Have any leaves, snow, or ice cleared regularly.  Use sand or salt on walking paths during winter.  Clean up any spills in your garage right away. This includes oil or grease spills. What can I do in the bathroom?  Use night lights.  Install grab bars by the toilet and in the tub and shower. Do not use towel bars as grab bars.  Use non-skid mats or decals in the tub or shower.  If you need to sit down in the shower, use  a plastic, non-slip stool.  Keep the floor dry. Clean up any water that spills on the floor as soon as it happens.  Remove soap buildup in the tub or shower regularly.  Attach bath mats securely with double-sided non-slip rug tape.  Do not have throw rugs and other things on the floor that can make you trip. What can I do in the bedroom?  Use night lights.  Make sure that you have a light by your bed that is easy to reach.  Do not use any sheets or blankets that are too big for your bed. They should not hang down onto the floor.  Have a firm chair that has side arms. You can use this for support while you get dressed.  Do not have throw rugs and other things on the floor that can make you trip. What can I do in the kitchen?  Clean up any spills right away.  Avoid walking on wet floors.  Keep items that you use a lot in easy-to-reach places.  If you need to reach something above you, use a strong step stool that has a grab bar.  Keep electrical cords out of the way.  Do not use floor polish or  wax that makes floors slippery. If you must use wax, use non-skid floor wax.  Do not have throw rugs and other things on the floor that can make you trip. What can I do with my stairs?  Do not leave any items on the stairs.  Make sure that there are handrails on both sides of the stairs and use them. Fix handrails that are broken or loose. Make sure that handrails are as long as the stairways.  Check any carpeting to make sure that it is firmly attached to the stairs. Fix any carpet that is loose or worn.  Avoid having throw rugs at the top or bottom of the stairs. If you do have throw rugs, attach them to the floor with carpet tape.  Make sure that you have a light switch at the top of the stairs and the bottom of the stairs. If you do not have them, ask someone to add them for you. What else can I do to help prevent falls?  Wear shoes that:  Do not have high heels.  Have  rubber bottoms.  Are comfortable and fit you well.  Are closed at the toe. Do not wear sandals.  If you use a stepladder:  Make sure that it is fully opened. Do not climb a closed stepladder.  Make sure that both sides of the stepladder are locked into place.  Ask someone to hold it for you, if possible.  Clearly mark and make sure that you can see:  Any grab bars or handrails.  First and last steps.  Where the edge of each step is.  Use tools that help you move around (mobility aids) if they are needed. These include:  Canes.  Walkers.  Scooters.  Crutches.  Turn on the lights when you go into a dark area. Replace any light bulbs as soon as they burn out.  Set up your furniture so you have a clear path. Avoid moving your furniture around.  If any of your floors are uneven, fix them.  If there are any pets around you, be aware of where they are.  Review your medicines with your doctor. Some medicines can make you feel dizzy. This can increase your chance of falling. Ask your doctor what other things that you can do to help prevent falls. This information is not intended to replace advice given to you by your health care provider. Make sure you discuss any questions you have with your health care provider. Document Released: 11/03/2008 Document Revised: 06/15/2015 Document Reviewed: 02/11/2014 Elsevier Interactive Patient Education  2017 [employer].

## 2019-02-08 NOTE — Progress Notes (Signed)
Subjective:   Xariyah Chao is a 74 y.o. female who presents for an Initial Medicare Annual Wellness Visit.  Review of Systems     Cardiac Risk Factors include: advanced age (>37men, >40 women);hypertension;dyslipidemia     Objective:    Today's Vitals   02/08/19 1050  BP: 132/76  Pulse: 61  Resp: 16  SpO2: 99%  Weight: 140 lb 6.4 oz (63.7 kg)  Height: 5\' 1"  (1.549 m)   Body mass index is 26.53 kg/m.  Advanced Directives 02/08/2019 01/29/2018 01/09/2018 07/15/2017 04/29/2017 02/06/2015 12/07/2014  Does Patient Have a Medical Advance Directive? No No No No No No No  Would patient like information on creating a medical advance directive? Yes (MAU/Ambulatory/Procedural Areas - Information given) - - Yes (MAU/Ambulatory/Procedural Areas - Information given) No - Patient declined No - patient declined information No - patient declined information    Current Medications (verified) Outpatient Encounter Medications as of 02/08/2019  Medication Sig  . aspirin EC 81 MG tablet Take by mouth.  Marland Kitchen atorvastatin (LIPITOR) 10 MG tablet Take 1 tablet (10 mg total) by mouth daily.  . cholecalciferol (VITAMIN D) 1000 units tablet Take 1,000 Units by mouth daily.  . hydrochlorothiazide (HYDRODIURIL) 25 MG tablet Take 1 tablet (25 mg total) by mouth daily.  Marland Kitchen lisinopril (ZESTRIL) 5 MG tablet Take 1 tablet (5 mg total) by mouth daily.  . Multiple Vitamins-Minerals (CENTRUM SILVER 50+WOMEN) TABS Take by mouth.  . Omega 3 1000 MG CAPS Take 1 capsule by mouth daily.  . pantoprazole (PROTONIX) 40 MG tablet Take 1 tablet (40 mg total) by mouth daily.   No facility-administered encounter medications on file as of 02/08/2019.    Allergies (verified) Guaifenesin & derivatives, Methocarbamol, Penicillins, and Tramadol hcl   History: Past Medical History:  Diagnosis Date  . Allergy Sinus  . Bone spur    right side of neck  . Colitis   . DDD (degenerative disc disease), cervical   . Diverticulitis    . GERD (gastroesophageal reflux disease)   . Heart murmur    followed by PCP  . HLD (hyperlipidemia)   . Hypertension   . Seasonal allergies    Past Surgical History:  Procedure Laterality Date  . BREAST BIOPSY Right 2004   neg  . CATARACT EXTRACTION W/PHACO Right 04/29/2017   Procedure: CATARACT EXTRACTION PHACO AND INTRAOCULAR LENS PLACEMENT ([facility]) RIGHT;  Surgeon: Eulogio Bear, MD;  Location: [place];  Service: Ophthalmology;  Laterality: Right;  . CATARACT EXTRACTION W/PHACO Left 07/15/2017   Procedure: CATARACT EXTRACTION PHACO AND INTRAOCULAR LENS PLACEMENT ([place]) LEFT;  Surgeon: Eulogio Bear, MD;  Location: [place] [facility];  Service: Ophthalmology;  Laterality: Left;  . COLONOSCOPY WITH PROPOFOL N/A 02/06/2015   Procedure: COLONOSCOPY WITH PROPOFOL;  Surgeon: [name], MD;  Location: [facility];  Service: Endoscopy;  Laterality: N/A;  . EYE SURGERY  2019  . TUBAL LIGATION  1980s  . VAGINAL HYSTERECTOMY  1990s   one ovary removed as well   Family History  Problem Relation Age of Onset  . Stroke Mother   . Arthritis Father   . Heart disease Paternal Aunt   . Cancer Paternal Grandmother   . Stomach cancer Paternal Grandmother   . Breast cancer Cousin        pat cousin   Social History   Socioeconomic History  . Marital status: Divorced    Spouse name: Not on file  . Number of children: 2  .  Years of education: Not on file  . Highest education level: Not on file  Occupational History  . Not on file  Tobacco Use  . Smoking status: Former Smoker    Packs/day: 0.50    Years: 50.00    Pack years: 25.00    Types: Cigarettes, E-cigarettes    Quit date: 11/22/2011    Years since quitting: 7.2  . Smokeless tobacco: Never Used  . Tobacco comment: quit 2014  Substance and Sexual Activity  . Alcohol use: Yes    Alcohol/week: 2.0 standard drinks    Types: 2 Glasses of wine per week  . Drug use: No  . Sexual activity: Yes    Birth  control/protection: None  Other Topics Concern  . Not on file  Social History Narrative  . Not on file   Social Determinants of Health   Financial Resource Strain: Low Risk   . Difficulty of Paying Living Expenses: Not hard at all  Food Insecurity: No Food Insecurity  . Worried About Charity fundraiser in the Last Year: Never true  . Ran Out of Food in the Last Year: Never true  Transportation Needs: No Transportation Needs  . Lack of Transportation (Medical): No  . Lack of Transportation (Non-Medical): No  Physical Activity: Insufficiently Active  . Days of Exercise per Week: 7 days  . Minutes of Exercise per Session: 10 min  Stress: No Stress Concern Present  . Feeling of Stress : Not at all  Social Connections: Unknown  . Frequency of Communication with Friends and Family: Patient refused  . Frequency of Social Gatherings with Friends and Family: Patient refused  . Attends Religious Services: Patient refused  . Active Member of Clubs or Organizations: Patient refused  . Attends Archivist Meetings: Patient refused  . Marital Status: Divorced    Tobacco Counseling Counseling given: Not Answered Comment: quit 2014   Clinical Intake:  Pre-visit preparation completed: Yes  Pain : No/denies pain     BMI - recorded: 26.53 Nutritional Status: BMI 25 -29 Overweight Nutritional Risks: None Diabetes: No  How often do you need to have someone help you when you read instructions, pamphlets, or other written materials from your doctor or pharmacy?: 1 - Never  Interpreter Needed?: No  Information entered by :: Clemetine Marker LPN   Activities of Daily Living In your present state of health, do you have any difficulty performing the following activities: 02/08/2019  Hearing? N  Comment declines hearing aids  Vision? N  Difficulty concentrating or making decisions? N  Walking or climbing stairs? N  Dressing or bathing? N  Doing errands, shopping? N  Preparing  Food and eating ? N  Using the Toilet? N  In the past six months, have you accidently leaked urine? N  Do you have problems with loss of bowel control? N  Managing your Medications? N  Managing your Finances? N  Housekeeping or managing your Housekeeping? N  Some recent data might be hidden     Immunizations and Health Maintenance Immunization History  Administered Date(s) Administered  . Fluad Quad(high Dose 65+) 10/12/2018  . Influenza, High Dose Seasonal PF 11/18/2016, 11/25/2017  . Influenza,inj,Quad PF,6+ Mos 12/07/2015  . PPD Test 01/17/2016  . Pneumococcal Conjugate-13 12/13/2015  . Pneumococcal Polysaccharide-23 07/05/2014  . Tdap 12/07/2015   There are no preventive care reminders to display for this patient.  Patient Care Team: Juline Patch, MD as PCP - General (Family Medicine)  Indicate any recent  Medical Services you may have received from other than [facility] providers in the past year (date may be approximate).     Assessment:   This is a routine wellness examination for Nataya.  Hearing/Vision screen  Hearing Screening   125Hz  250Hz  500Hz  1000Hz  2000Hz  3000Hz  4000Hz  6000Hz  8000Hz   Right ear:           Left ear:           Comments: Pt denies hearing difficulty  Vision Screening Comments: Annual vision screenings done by Dr. Mallie Mussel  Dietary issues and exercise activities discussed: Current Exercise Habits: Home exercise routine, Type of exercise: Other - see comments(exercise bike), Time (Minutes): 10, Frequency (Times/Week): 7, Weekly Exercise (Minutes/Week): 70, Intensity: Moderate, Exercise limited by: None identified  Goals   None    Depression Screen PHQ 2/9 Scores 02/08/2019 10/12/2018 05/26/2018 12/04/2016 07/17/2015 07/05/2014  PHQ - 2 Score 0 0 0 0 0 0  PHQ- 9 Score - 0 0 - - -    Fall Risk Fall Risk  02/08/2019 12/04/2016 07/17/2015 07/05/2014  Falls in the past year? 0 No No No  Number falls in past yr: 0 - - -  Injury with Fall? 0 - - -  Risk  for fall due to : No Fall Risks - - -  Follow up Falls prevention discussed - - -    FALL RISK PREVENTION PERTAINING TO THE HOME:  Any stairs in or around the home? Yes  If so, do they handrails? Yes   Home free of loose throw rugs in walkways, pet beds, electrical cords, etc? Yes  Adequate lighting in your home to reduce risk of falls? Yes   ASSISTIVE DEVICES UTILIZED TO PREVENT FALLS:  Life alert? No  Use of a cane, walker or w/c? No  Grab bars in the bathroom? No  Shower chair or bench in shower? No  Elevated toilet seat or a handicapped toilet? No   DME ORDERS:  DME order needed?  No   TIMED UP AND GO:  Was the test performed? Yes .  Length of time to ambulate 10 feet: 5 sec.   GAIT:  Appearance of gait: Gait stead-fast and without the use of an assistive device.    Education: Fall risk prevention has been discussed.  Intervention(s) required? No   Cognitive Function:     6CIT Screen 02/08/2019  What Year? 0 points  What month? 0 points  What time? 0 points  Count back from 20 0 points  Months in reverse 0 points  Repeat phrase 2 points  Total Score 2    Screening Tests Health Maintenance  Topic Date Due  . Hepatitis C Screening  01/12/2020 (Originally 02-Nov-1945)  . MAMMOGRAM  10/21/2020  . COLONOSCOPY  02/05/2025  . TETANUS/TDAP  12/06/2025  . INFLUENZA VACCINE  Completed  . DEXA SCAN  Completed  . PNA vac Low Risk Adult  Completed    Qualifies for Shingles Vaccine? Yes . Due for Shingrix. Education has been provided regarding the importance of this vaccine. Pt has been advised to call insurance company to determine out of pocket expense. Advised may also receive vaccine at local pharmacy or Health Dept. Verbalized acceptance and understanding.  Tdap: Up to date  Flu Vaccine: Up to date  Pneumococcal Vaccine: Up to date  Cancer Screenings:  Colorectal Screening: Completed 02/06/15. Repeat every 10 years;   Mammogram: Completed 10/22/2018.  Repeat every year;   Bone Density: Completed 01/01/18. Results reflect  OSTEOPENIA. Repeat  every 2 years.   Lung Cancer Screening: (Low Dose CT Chest recommended if Age 69-80 years, 30 pack-year currently smoking OR have quit w/in 15years.) does not qualify.    Additional Screening:  Hepatitis C Screening: does qualify; postponed  Vision Screening: Recommended annual ophthalmology exams for early detection of glaucoma and other disorders of the eye. Is the patient up to date with their annual eye exam?  Yes  Who is the provider or what is the name of the office in which the pt attends annual eye exams? Dr. Mallie Mussel  Dental Screening: Recommended annual dental exams for proper oral hygiene  Community Resource Referral:  CRR required this visit?  No      Plan:    I have personally reviewed and addressed the Medicare Annual Wellness questionnaire and have noted the following in the patient's chart:  A. Medical and social history B. Use of alcohol, tobacco or illicit drugs  C. Current medications and supplements D. Functional ability and status E.  Nutritional status F.  Physical activity G. Advance directives H. List of other physicians I.  Hospitalizations, surgeries, and ER visits in previous 12 months J.  [place] such as hearing and vision if needed, cognitive and depression L. Referrals and appointments   In addition, I have reviewed and discussed with patient certain preventive protocols, quality metrics, and best practice recommendations. A written personalized care plan for preventive services as well as general preventive health recommendations were provided to patient.   Signed,  Clemetine Marker, LPN Nurse Health Advisor    Nurse Notes: none

## 2019-02-23 ENCOUNTER — Other Ambulatory Visit: Payer: Self-pay

## 2019-02-23 ENCOUNTER — Ambulatory Visit (INDEPENDENT_AMBULATORY_CARE_PROVIDER_SITE_OTHER): Payer: Medicare HMO | Admitting: Family Medicine

## 2019-02-23 ENCOUNTER — Encounter: Payer: Self-pay | Admitting: Family Medicine

## 2019-02-23 VITALS — BP 152/78 | HR 66 | Ht 61.0 in | Wt 142.0 lb

## 2019-02-23 DIAGNOSIS — R05 Cough: Secondary | ICD-10-CM

## 2019-02-23 DIAGNOSIS — T464X5A Adverse effect of angiotensin-converting-enzyme inhibitors, initial encounter: Secondary | ICD-10-CM | POA: Diagnosis not present

## 2019-02-23 DIAGNOSIS — I10 Essential (primary) hypertension: Secondary | ICD-10-CM

## 2019-02-23 DIAGNOSIS — R058 Other specified cough: Secondary | ICD-10-CM

## 2019-02-23 MED ORDER — LOSARTAN POTASSIUM-HCTZ 50-12.5 MG PO TABS: 1.0000 | ORAL_TABLET | Freq: Every day | ORAL | 1 refills | Status: DC

## 2019-02-23 NOTE — Progress Notes (Signed)
Date:  02/23/2019   Name:  Emily Boyer   DOB:  07/05/1945   MRN:  [id number]   Chief Complaint: Hypertension (recheck b/p)  Hypertension This is a chronic problem. The current episode started more than 1 year ago. The problem has been gradually improving since onset. The problem is controlled. Pertinent negatives include no anxiety, blurred vision, chest pain, headaches, malaise/fatigue, neck pain, orthopnea, palpitations, peripheral edema, PND, shortness of breath or sweats. There are no associated agents to hypertension. There are no known risk factors for coronary artery disease. The current treatment provides moderate improvement. There are no compliance problems.     Lab Results  Component Value Date   CREATININE 0.85 01/12/2019   BUN 19 01/12/2019   NA 137 01/12/2019   K 3.8 01/12/2019   CL 95 (L) 01/12/2019   CO2 29 01/12/2019   Lab Results  Component Value Date   CHOL 268 (H) 10/12/2018   HDL 53 10/12/2018   LDLCALC 190 (H) 10/12/2018   TRIG 138 10/12/2018   CHOLHDL 5.3 (H) 12/04/2016   Lab Results  Component Value Date   TSH 1.150 01/12/2019   No results found for: HGBA1C   Review of Systems  Constitutional: Negative.  Negative for chills, fatigue, fever, malaise/fatigue and unexpected weight change.  HENT: Negative for congestion, ear discharge, ear pain, rhinorrhea, sinus pressure, sneezing and sore throat.   Eyes: Negative for blurred vision, photophobia, pain, discharge, redness and itching.  Respiratory: Negative for cough, shortness of breath, wheezing and stridor.   Cardiovascular: Negative for chest pain, palpitations, orthopnea and PND.  Gastrointestinal: Negative for abdominal pain, blood in stool, constipation, diarrhea, nausea and vomiting.  Endocrine: Negative for cold intolerance, heat intolerance, polydipsia, polyphagia and polyuria.  Genitourinary: Negative for dysuria, flank pain, frequency, hematuria, menstrual problem, pelvic pain,  urgency, vaginal bleeding and vaginal discharge.  Musculoskeletal: Negative for arthralgias, back pain, myalgias and neck pain.  Skin: Negative for rash.  Allergic/Immunologic: Negative for environmental allergies and food allergies.  Neurological: Negative for dizziness, weakness, light-headedness, numbness and headaches.  Hematological: Negative for adenopathy. Does not bruise/bleed easily.  Psychiatric/Behavioral: Negative for dysphoric mood. The patient is not nervous/anxious.     Patient Active Problem List   Diagnosis Date Noted   DDD (degenerative disc disease), cervical 05/28/2018   Sebaceous cyst 12/20/2015   Abnormal findings-gastrointestinal tract    Encounter for general adult medical examination without abnormal findings 07/04/2014   Nerve root pain 07/04/2014   Gastroesophageal reflux disease 07/04/2014   HLD (hyperlipidemia) 07/04/2014   Hypertension 07/04/2014    Allergies  Allergen Reactions   Guaifenesin & Derivatives    Methocarbamol Itching   Penicillins Itching   Tramadol Hcl Itching    Past Surgical History:  Procedure Laterality Date   BREAST BIOPSY Right 2004   neg   CATARACT EXTRACTION W/PHACO Right 04/29/2017   Procedure: CATARACT EXTRACTION PHACO AND INTRAOCULAR LENS PLACEMENT ([place]) RIGHT;  Surgeon: Eulogio Bear, MD;  Location: [place];  Service: Ophthalmology;  Laterality: Right;   CATARACT EXTRACTION W/PHACO Left 07/15/2017   Procedure: CATARACT EXTRACTION PHACO AND INTRAOCULAR LENS PLACEMENT ([place]) LEFT;  Surgeon: Eulogio Bear, MD;  Location: [place];  Service: Ophthalmology;  Laterality: Left;   COLONOSCOPY WITH PROPOFOL N/A 02/06/2015   Procedure: COLONOSCOPY WITH PROPOFOL;  Surgeon: [name], MD;  Location: [place];  Service: Endoscopy;  Laterality: N/A;   EYE SURGERY  2019   TUBAL LIGATION  1980s  VAGINAL HYSTERECTOMY  1990s   one ovary removed as well    Social History    Tobacco Use   Smoking status: Former Smoker    Packs/day: 0.50    Years: 50.00    Pack years: 25.00    Types: Cigarettes, E-cigarettes    Quit date: 11/22/2011    Years since quitting: 7.2   Smokeless tobacco: Never Used   Tobacco comment: quit 2014  Substance Use Topics   Alcohol use: Yes    Alcohol/week: 2.0 standard drinks    Types: 2 Glasses of wine per week   Drug use: No     Medication list has been reviewed and updated.  Current Meds  Medication Sig   aspirin EC 81 MG tablet Take by mouth.   atorvastatin (LIPITOR) 10 MG tablet Take 1 tablet (10 mg total) by mouth daily.   cholecalciferol (VITAMIN D) 1000 units tablet Take 1,000 Units by mouth daily.   Multiple Vitamins-Minerals (CENTRUM SILVER 50+WOMEN) TABS Take by mouth.   Omega 3 1000 MG CAPS Take 1 capsule by mouth daily.   pantoprazole (PROTONIX) 40 MG tablet Take 1 tablet (40 mg total) by mouth daily.   [DISCONTINUED] hydrochlorothiazide (HYDRODIURIL) 25 MG tablet Take 1 tablet (25 mg total) by mouth daily.   [DISCONTINUED] lisinopril (ZESTRIL) 5 MG tablet Take 1 tablet (5 mg total) by mouth daily.    PHQ 2/9 Scores 02/08/2019 10/12/2018 05/26/2018 12/04/2016  PHQ - 2 Score 0 0 0 0  PHQ- 9 Score - 0 0 -    BP Readings from Last 3 Encounters:  02/23/19 (!) 152/78  02/08/19 132/76  01/12/19 (!) 138/100    Physical Exam Constitutional:      General: She is not in acute distress.    Appearance: She is not diaphoretic.  HENT:     Head: Normocephalic and atraumatic.     Right Ear: Tympanic membrane, ear canal and external ear normal.     Left Ear: Tympanic membrane, ear canal and external ear normal.     Nose: Nose normal.  Eyes:     General:        Right eye: No discharge.        Left eye: No discharge.     Conjunctiva/sclera: Conjunctivae normal.     Pupils: Pupils are equal, round, and reactive to light.  Neck:     Thyroid: No thyromegaly.     Vascular: No JVD.  Cardiovascular:      Rate and Rhythm: Normal rate and regular rhythm.     Chest Wall: PMI is not displaced. No thrill.     Heart sounds: Normal heart sounds and S1 normal. No murmur. No systolic murmur. No diastolic murmur. No friction rub. No gallop. No S3 or S4 sounds.   Pulmonary:     Effort: Pulmonary effort is normal.     Breath sounds: Normal breath sounds.  Abdominal:     General: Bowel sounds are normal.     Palpations: Abdomen is soft. There is no mass.     Tenderness: There is no abdominal tenderness. There is no guarding.  Musculoskeletal:        General: Normal range of motion.     Cervical back: Normal range of motion and neck supple.     Right lower leg: No edema.     Left lower leg: No edema.  Lymphadenopathy:     Cervical: No cervical adenopathy.  Skin:    General: Skin is warm and dry.  Neurological:     Mental Status: She is alert.     Deep Tendon Reflexes: Reflexes are normal and symmetric.     Wt Readings from Last 3 Encounters:  02/23/19 142 lb (64.4 kg)  02/08/19 140 lb 6.4 oz (63.7 kg)  01/12/19 134 lb (60.8 kg)    BP (!) 152/78    Pulse 66    Ht 5\' 1"  (1.549 m)    Wt 142 lb (64.4 kg)    BMI 26.83 kg/m   Assessment and Plan: 1. Essential hypertension Chronic.  Controlled.  Stable.  Patient returns after initiation of lisinopril 10 mg.  Patient however experienced some cough while on the ACE inhibitor therefore patient was switched to losartan and this was combined with hydrochlorothiazide so patient had just one medication to take for blood pressure.  Patient will continue losartan hydrochlorothiazide 50-12 0.5 and will be rechecked in 6 months with labs. - losartan-hydrochlorothiazide (HYZAAR) 50-12.5 MG tablet; Take 1 tablet by mouth daily.  Dispense: 90 tablet; Refill: 1  2. Cough due to ACE inhibitor As noted by patient developed a cough while taking lisinopril.  Patient will be switched to losartan ACE receptor blocker agent and to call if cough should continue.

## 2019-03-29 ENCOUNTER — Ambulatory Visit: Payer: Medicare HMO | Admitting: Family Medicine

## 2019-03-31 DIAGNOSIS — H1045 Other chronic allergic conjunctivitis: Secondary | ICD-10-CM | POA: Diagnosis not present

## 2019-04-02 ENCOUNTER — Ambulatory Visit (INDEPENDENT_AMBULATORY_CARE_PROVIDER_SITE_OTHER): Payer: Medicare HMO | Admitting: Family Medicine

## 2019-04-02 ENCOUNTER — Other Ambulatory Visit: Payer: Self-pay

## 2019-04-02 ENCOUNTER — Encounter: Payer: Self-pay | Admitting: Family Medicine

## 2019-04-02 VITALS — BP 120/80 | HR 60 | Ht 61.0 in | Wt 146.0 lb

## 2019-04-02 DIAGNOSIS — M5030 Other cervical disc degeneration, unspecified cervical region: Secondary | ICD-10-CM

## 2019-04-02 DIAGNOSIS — M7541 Impingement syndrome of right shoulder: Secondary | ICD-10-CM | POA: Diagnosis not present

## 2019-04-02 NOTE — Progress Notes (Signed)
Date:  04/02/2019   Name:  Emily Boyer   DOB:  1945-07-03   MRN:  [id number]   Chief Complaint: Shoulder Pain (has had 2 injections- Aug and Dec. )  Shoulder Pain  The pain is present in the neck. This is a recurrent problem. The current episode started more than 1 year ago. The problem occurs constantly. The problem has been gradually worsening. The quality of the pain is described as aching. The pain is moderate. Associated symptoms include a limited range of motion, numbness and tingling. Pertinent negatives include no fever, inability to bear weight, itching, joint locking, joint swelling or stiffness. She has tried NSAIDS and acetaminophen (injection) for the symptoms. The treatment provided moderate relief.    Lab Results  Component Value Date   CREATININE 0.85 01/12/2019   BUN 19 01/12/2019   NA 137 01/12/2019   K 3.8 01/12/2019   CL 95 (L) 01/12/2019   CO2 29 01/12/2019   Lab Results  Component Value Date   CHOL 268 (H) 10/12/2018   HDL 53 10/12/2018   LDLCALC 190 (H) 10/12/2018   TRIG 138 10/12/2018   CHOLHDL 5.3 (H) 12/04/2016   Lab Results  Component Value Date   TSH 1.150 01/12/2019   No results found for: HGBA1C   Review of Systems  Constitutional: Negative.  Negative for chills, fatigue, fever and unexpected weight change.  HENT: Negative for congestion, ear discharge, ear pain, rhinorrhea, sinus pressure, sneezing and sore throat.   Eyes: Negative for photophobia, pain, discharge, redness and itching.  Respiratory: Negative for cough, shortness of breath, wheezing and stridor.   Gastrointestinal: Negative for abdominal pain, blood in stool, constipation, diarrhea, nausea and vomiting.  Endocrine: Negative for cold intolerance, heat intolerance, polydipsia, polyphagia and polyuria.  Genitourinary: Negative for dysuria, flank pain, frequency, hematuria, menstrual problem, pelvic pain, urgency, vaginal bleeding and vaginal discharge.  Musculoskeletal:  Negative for arthralgias, back pain, myalgias and stiffness.  Skin: Negative for itching and rash.  Allergic/Immunologic: Negative for environmental allergies and food allergies.  Neurological: Positive for tingling and numbness. Negative for dizziness, weakness, light-headedness and headaches.  Hematological: Negative for adenopathy. Does not bruise/bleed easily.  Psychiatric/Behavioral: Negative for dysphoric mood. The patient is not nervous/anxious.     Patient Active Problem List   Diagnosis Date Noted  . DDD (degenerative disc disease), cervical 05/28/2018  . Sebaceous cyst 12/20/2015  . Abnormal findings-gastrointestinal tract   . Encounter for general adult medical examination without abnormal findings 07/04/2014  . Nerve root pain 07/04/2014  . Gastroesophageal reflux disease 07/04/2014  . HLD (hyperlipidemia) 07/04/2014  . Hypertension 07/04/2014    Allergies  Allergen Reactions  . Guaifenesin & Derivatives   . Methocarbamol Itching  . Penicillins Itching  . Tramadol Hcl Itching    Past Surgical History:  Procedure Laterality Date  . BREAST BIOPSY Right 2004   neg  . CATARACT EXTRACTION W/PHACO Right 04/29/2017   Procedure: CATARACT EXTRACTION PHACO AND INTRAOCULAR LENS PLACEMENT ([facility]) RIGHT;  Surgeon: Eulogio Bear, MD;  Location: [place];  Service: Ophthalmology;  Laterality: Right;  . CATARACT EXTRACTION W/PHACO Left 07/15/2017   Procedure: CATARACT EXTRACTION PHACO AND INTRAOCULAR LENS PLACEMENT ([place]) LEFT;  Surgeon: Eulogio Bear, MD;  Location: [place];  Service: Ophthalmology;  Laterality: Left;  . COLONOSCOPY WITH PROPOFOL N/A 02/06/2015   Procedure: COLONOSCOPY WITH PROPOFOL;  Surgeon: [name], MD;  Location: [place];  Service: Endoscopy;  Laterality: N/A;  . EYE SURGERY  2019  . TUBAL LIGATION  1980s  . VAGINAL HYSTERECTOMY  1990s   one ovary removed as well    Social History   Tobacco Use  . Smoking  status: Former Smoker    Packs/day: 0.50    Years: 50.00    Pack years: 25.00    Types: Cigarettes, E-cigarettes    Quit date: 11/22/2011    Years since quitting: 7.3  . Smokeless tobacco: Never Used  . Tobacco comment: quit 2014  Substance Use Topics  . Alcohol use: Yes    Alcohol/week: 2.0 standard drinks    Types: 2 Glasses of wine per week  . Drug use: No     Medication list has been reviewed and updated.  Current Meds  Medication Sig  . aspirin EC 81 MG tablet Take by mouth.  Marland Kitchen atorvastatin (LIPITOR) 10 MG tablet Take 1 tablet (10 mg total) by mouth daily.  . cholecalciferol (VITAMIN D) 1000 units tablet Take 1,000 Units by mouth daily.  Marland Kitchen losartan-hydrochlorothiazide (HYZAAR) 50-12.5 MG tablet Take 1 tablet by mouth daily.  . Multiple Vitamins-Minerals (CENTRUM SILVER 50+WOMEN) TABS Take by mouth.  . Omega 3 1000 MG CAPS Take 1 capsule by mouth daily.  . pantoprazole (PROTONIX) 40 MG tablet Take 1 tablet (40 mg total) by mouth daily.    PHQ 2/9 Scores 02/08/2019 10/12/2018 05/26/2018 12/04/2016  PHQ - 2 Score 0 0 0 0  PHQ- 9 Score - 0 0 -    BP Readings from Last 3 Encounters:  04/02/19 120/80  02/23/19 (!) 152/78  02/08/19 132/76    Physical Exam Vitals and nursing note reviewed.  Constitutional:      Appearance: She is well-developed.  HENT:     Head: Normocephalic.     Right Ear: Tympanic membrane, ear canal and external ear normal.     Left Ear: Tympanic membrane, ear canal and external ear normal.  Eyes:     General: Lids are everted, no foreign bodies appreciated. No scleral icterus.       Left eye: No foreign body or hordeolum.     Conjunctiva/sclera: Conjunctivae normal.     Right eye: Right conjunctiva is not injected.     Left eye: Left conjunctiva is not injected.     Pupils: Pupils are equal, round, and reactive to light.  Neck:     Thyroid: No thyromegaly.     Vascular: No JVD.     Trachea: No tracheal deviation.  Cardiovascular:     Rate and  Rhythm: Normal rate and regular rhythm.     Heart sounds: Normal heart sounds. No murmur. No friction rub. No gallop.   Pulmonary:     Effort: Pulmonary effort is normal. No respiratory distress.     Breath sounds: Normal breath sounds. No wheezing or rales.  Abdominal:     General: Bowel sounds are normal.     Palpations: Abdomen is soft. There is no mass.     Tenderness: There is no abdominal tenderness. There is no guarding or rebound.  Musculoskeletal:     Right shoulder: Tenderness present. Decreased range of motion.     Cervical back: Normal range of motion and neck supple. No swelling, edema, deformity, erythema, signs of trauma, rigidity, tenderness or bony tenderness. Pain with movement present. Normal range of motion.     Comments: Pain rotation to right neck/// tender supraspinatis  Lymphadenopathy:     Cervical: No cervical adenopathy.  Skin:    General: Skin is warm.  Findings: No rash.  Neurological:     Mental Status: She is alert and oriented to person, place, and time.     Cranial Nerves: No cranial nerve deficit.     Deep Tendon Reflexes: Reflexes normal.  Psychiatric:        Mood and Affect: Mood is not anxious or depressed.     Wt Readings from Last 3 Encounters:  04/02/19 146 lb (66.2 kg)  02/23/19 142 lb (64.4 kg)  02/08/19 140 lb 6.4 oz (63.7 kg)    BP 120/80   Pulse 60   Ht 5\' 1"  (1.549 m)   Wt 146 lb (66.2 kg)   BMI 27.59 kg/m   Assessment and Plan: 1. Impingement syndrome of right shoulder Patient has pain that is surrounding her right shoulder but of 2 etiologies.  The first of which patient has impingement syndrome with tenderness over her supraspinatus tendon and inability to abduct her right arm.  Patient's had 2 previous injections but there is immediate relief after the injection but the pain returned shortly thereafter.  Patient has been referred back to orthopedics for evaluation and further therapy as determined. - Ambulatory referral  to Orthopedic Surgery  2. DDD (degenerative disc disease), cervical Patient is also noted to have both on x-ray and MRI degenerative disc disease of the C4-C5 C5-C6 C6-C7 with moderate to severe right foraminal stenosis and mild left foraminal stenosis at C6-C7 which would explain the radicular nature of the pain as well.  Patient is being referred back to orthopedics to Dr. Sharlet Salina for reevaluation who as the patient understands had suggested the possibility of injection of the cervical disc area.  Patient will continue to use nonsteroidals for pain control until referral is secured.  Referral was made to orthopedics extranodal clinic for reevaluation of the both circumstances. - Ambulatory referral to Orthopedic Surgery

## 2019-04-15 DIAGNOSIS — Z20828 Contact with and (suspected) exposure to other viral communicable diseases: Secondary | ICD-10-CM | POA: Diagnosis not present

## 2019-04-28 DIAGNOSIS — M5412 Radiculopathy, cervical region: Secondary | ICD-10-CM | POA: Diagnosis not present

## 2019-04-28 DIAGNOSIS — M7541 Impingement syndrome of right shoulder: Secondary | ICD-10-CM | POA: Diagnosis not present

## 2019-04-28 DIAGNOSIS — M5030 Other cervical disc degeneration, unspecified cervical region: Secondary | ICD-10-CM | POA: Diagnosis not present

## 2019-05-26 DIAGNOSIS — M5030 Other cervical disc degeneration, unspecified cervical region: Secondary | ICD-10-CM | POA: Diagnosis not present

## 2019-05-26 DIAGNOSIS — M5412 Radiculopathy, cervical region: Secondary | ICD-10-CM | POA: Diagnosis not present

## 2019-05-31 ENCOUNTER — Encounter: Payer: Self-pay | Admitting: Family Medicine

## 2019-05-31 ENCOUNTER — Other Ambulatory Visit: Payer: Self-pay

## 2019-05-31 ENCOUNTER — Ambulatory Visit (INDEPENDENT_AMBULATORY_CARE_PROVIDER_SITE_OTHER): Payer: Medicare HMO | Admitting: Family Medicine

## 2019-05-31 VITALS — BP 130/64 | HR 64 | Ht 61.0 in | Wt 148.0 lb

## 2019-05-31 DIAGNOSIS — L03312 Cellulitis of back [any part except buttock]: Secondary | ICD-10-CM | POA: Diagnosis not present

## 2019-05-31 DIAGNOSIS — W57XXXA Bitten or stung by nonvenomous insect and other nonvenomous arthropods, initial encounter: Secondary | ICD-10-CM

## 2019-05-31 MED ORDER — DOXYCYCLINE HYCLATE 100 MG PO TABS: 100.0000 mg | ORAL_TABLET | Freq: Two times a day (BID) | ORAL | 0 refills | Status: DC

## 2019-05-31 NOTE — Progress Notes (Signed)
Date:  05/31/2019   Name:  Emily Boyer   DOB:  06/01/1945   MRN:  [id number]   Chief Complaint: Tick Removal  Patient is a 74 year old female who presents for a mole check/actually tick bite exam. The patient reports the following problems: none. Health maintenance has been reviewed up to date.   Lab Results  Component Value Date   CREATININE 0.85 01/12/2019   BUN 19 01/12/2019   NA 137 01/12/2019   K 3.8 01/12/2019   CL 95 (L) 01/12/2019   CO2 29 01/12/2019   Lab Results  Component Value Date   CHOL 268 (H) 10/12/2018   HDL 53 10/12/2018   LDLCALC 190 (H) 10/12/2018   TRIG 138 10/12/2018   CHOLHDL 5.3 (H) 12/04/2016   Lab Results  Component Value Date   TSH 1.150 01/12/2019   No results found for: HGBA1C Lab Results  Component Value Date   WBC 11.0 (H) 01/12/2019   HGB 13.9 01/12/2019   HCT 41.3 01/12/2019   MCV 88 01/12/2019   PLT 335 01/12/2019   Lab Results  Component Value Date   ALT 20 01/12/2019   AST 22 01/12/2019   ALKPHOS 84 01/12/2019   BILITOT 0.3 01/12/2019     Review of Systems  Constitutional: Positive for fatigue. Negative for chills, fever and unexpected weight change.  HENT: Negative for congestion, ear discharge, ear pain, rhinorrhea, sinus pressure, sneezing and sore throat.   Eyes: Negative for photophobia, pain, discharge, redness and itching.  Respiratory: Negative for cough, shortness of breath, wheezing and stridor.   Gastrointestinal: Negative for abdominal pain, blood in stool, constipation, diarrhea, nausea and vomiting.  Endocrine: Negative for cold intolerance, heat intolerance, polydipsia, polyphagia and polyuria.  Genitourinary: Negative for dysuria, flank pain, frequency, hematuria, menstrual problem, pelvic pain, urgency, vaginal bleeding and vaginal discharge.  Musculoskeletal: Negative for arthralgias, back pain and myalgias.  Skin: Negative for rash.  Allergic/Immunologic: Negative for environmental allergies and  food allergies.  Neurological: Positive for headaches. Negative for dizziness, weakness, light-headedness and numbness.  Hematological: Negative for adenopathy. Does not bruise/bleed easily.  Psychiatric/Behavioral: Negative for dysphoric mood. The patient is not nervous/anxious.     Patient Active Problem List   Diagnosis Date Noted  . DDD (degenerative disc disease), cervical 05/28/2018  . Sebaceous cyst 12/20/2015  . Abnormal findings-gastrointestinal tract   . Encounter for general adult medical examination without abnormal findings 07/04/2014  . Nerve root pain 07/04/2014  . Gastroesophageal reflux disease 07/04/2014  . HLD (hyperlipidemia) 07/04/2014  . Hypertension 07/04/2014    Allergies  Allergen Reactions  . Guaifenesin & Derivatives   . Methocarbamol Itching  . Penicillins Itching  . Tramadol Hcl Itching    Past Surgical History:  Procedure Laterality Date  . BREAST BIOPSY Right 2004   neg  . CATARACT EXTRACTION W/PHACO Right 04/29/2017   Procedure: CATARACT EXTRACTION PHACO AND INTRAOCULAR LENS PLACEMENT ([facility]) RIGHT;  Surgeon: Eulogio Bear, MD;  Location: [place];  Service: Ophthalmology;  Laterality: Right;  . CATARACT EXTRACTION W/PHACO Left 07/15/2017   Procedure: CATARACT EXTRACTION PHACO AND INTRAOCULAR LENS PLACEMENT ([place]) LEFT;  Surgeon: Eulogio Bear, MD;  Location: [place];  Service: Ophthalmology;  Laterality: Left;  . COLONOSCOPY WITH PROPOFOL N/A 02/06/2015   Procedure: COLONOSCOPY WITH PROPOFOL;  Surgeon: [name], MD;  Location: [place];  Service: Endoscopy;  Laterality: N/A;  . EYE SURGERY  2019  . TUBAL LIGATION  1980s  .  VAGINAL HYSTERECTOMY  1990s   one ovary removed as well    Social History   Tobacco Use  . Smoking status: Former Smoker    Packs/day: 0.50    Years: 50.00    Pack years: 25.00    Types: Cigarettes, E-cigarettes    Quit date: 11/22/2011    Years since quitting: 7.5  .  Smokeless tobacco: Never Used  . Tobacco comment: quit 2014  Substance Use Topics  . Alcohol use: Yes    Alcohol/week: 2.0 standard drinks    Types: 2 Glasses of wine per week  . Drug use: No     Medication list has been reviewed and updated.  Current Meds  Medication Sig  . aspirin EC 81 MG tablet Take by mouth.  Marland Kitchen atorvastatin (LIPITOR) 10 MG tablet Take 1 tablet (10 mg total) by mouth daily.  . cholecalciferol (VITAMIN D) 1000 units tablet Take 1,000 Units by mouth daily.  Marland Kitchen losartan-hydrochlorothiazide (HYZAAR) 50-12.5 MG tablet Take 1 tablet by mouth daily.  . Multiple Vitamins-Minerals (CENTRUM SILVER 50+WOMEN) TABS Take by mouth.  . Omega 3 1000 MG CAPS Take 1 capsule by mouth daily.  . pantoprazole (PROTONIX) 40 MG tablet Take 1 tablet (40 mg total) by mouth daily.    PHQ 2/9 Scores 05/31/2019 02/08/2019 10/12/2018 05/26/2018  PHQ - 2 Score 0 0 0 0  PHQ- 9 Score 0 - 0 0    BP Readings from Last 3 Encounters:  05/31/19 130/64  04/02/19 120/80  02/23/19 (!) 152/78    Physical Exam Vitals and nursing note reviewed.  Constitutional:      General: She is not in acute distress.    Appearance: She is not diaphoretic.  HENT:     Head: Normocephalic and atraumatic.     Right Ear: External ear normal.     Left Ear: External ear normal.     Nose: Nose normal.  Eyes:     General:        Right eye: No discharge.        Left eye: No discharge.     Conjunctiva/sclera: Conjunctivae normal.     Pupils: Pupils are equal, round, and reactive to light.  Neck:     Thyroid: No thyromegaly.     Vascular: No JVD.  Cardiovascular:     Rate and Rhythm: Normal rate and regular rhythm.     Heart sounds: Normal heart sounds. No murmur. No friction rub. No gallop.   Pulmonary:     Effort: Pulmonary effort is normal.     Breath sounds: Normal breath sounds.  Abdominal:     General: Bowel sounds are normal.     Palpations: Abdomen is soft. There is no mass.     Tenderness: There is  no abdominal tenderness. There is no guarding.  Musculoskeletal:        General: Normal range of motion.     Cervical back: Normal range of motion and neck supple.  Lymphadenopathy:     Cervical: No cervical adenopathy.  Skin:    General: Skin is warm and dry.     Findings: Erythema present.     Comments: Embedded tick  Neurological:     Mental Status: She is alert.     Deep Tendon Reflexes: Reflexes are normal and symmetric.     Wt Readings from Last 3 Encounters:  05/31/19 148 lb (67.1 kg)  04/02/19 146 lb (66.2 kg)  02/23/19 142 lb (64.4 kg)    BP 130/64  Pulse 64   Ht 5\' 1"  (1.549 m)   Wt 148 lb (67.1 kg)   BMI 27.96 kg/m   Assessment and Plan: 1. Cellulitis of back except buttock Acute.  Persistent.  Rounding area of a tick bite.  Will counter with doxycycline 100 mg twice a day. - doxycycline (VIBRA-TABS) 100 MG tablet; Take 1 tablet (100 mg total) by mouth 2 (two) times daily.  Dispense: 20 tablet; Refill: 0  2. Tick bite, initial encounter Initial encounter for evaluation of suspected mole as noted by her daughter and sister which turns out to be a tick that was embedded.  Area was cleaned with alcohol and tick was removed with forceps.  Area was cleaned again and a #15 blade was curetted over the area to remove any possible residual debris.  Triple antibiotic ointment was applied and Band-Aid was in place.  Patient was started on doxycycline 100 mg twice a day for 10 days.  Patient was instructed on application of insecticide when sitting beneath trees.

## 2019-06-01 ENCOUNTER — Telehealth: Payer: Self-pay | Admitting: Family Medicine

## 2019-06-01 NOTE — Telephone Encounter (Signed)
Spoke to pt- told to wash with Soft soap, apply neosporin and band-aid twice a day

## 2019-06-01 NOTE — Telephone Encounter (Signed)
Copied from [place] [id number]. Topic: General - Inquiry >> Jun 01, 2019  9:32 AM Mathis Bud wrote: Reason for CRM: Patient called requesting PCP or nurse to call back regarding patient got tick removed from office yesterday.  Patient states the spot where tick was is still leaking, and is wondering if she needs to put Band-Aid on spot or anything else she should be aware of. Call back [phone]

## 2019-06-11 ENCOUNTER — Other Ambulatory Visit: Payer: Self-pay

## 2019-06-11 ENCOUNTER — Ambulatory Visit (INDEPENDENT_AMBULATORY_CARE_PROVIDER_SITE_OTHER): Payer: Medicare HMO | Admitting: Family Medicine

## 2019-06-11 ENCOUNTER — Encounter: Payer: Self-pay | Admitting: Family Medicine

## 2019-06-11 ENCOUNTER — Telehealth: Payer: Self-pay | Admitting: Family Medicine

## 2019-06-11 VITALS — BP 128/64 | HR 67 | Ht 61.0 in | Wt 146.0 lb

## 2019-06-11 DIAGNOSIS — L03312 Cellulitis of back [any part except buttock]: Secondary | ICD-10-CM | POA: Diagnosis not present

## 2019-06-11 DIAGNOSIS — W57XXXD Bitten or stung by nonvenomous insect and other nonvenomous arthropods, subsequent encounter: Secondary | ICD-10-CM | POA: Diagnosis not present

## 2019-06-11 MED ORDER — TRIAMCINOLONE ACETONIDE 0.1 % EX CREA: 1.0000 "application " | TOPICAL_CREAM | Freq: Two times a day (BID) | CUTANEOUS | 0 refills | Status: DC

## 2019-06-11 MED ORDER — SULFAMETHOXAZOLE-TRIMETHOPRIM 800-160 MG PO TABS: 1.0000 | ORAL_TABLET | Freq: Two times a day (BID) | ORAL | 0 refills | Status: DC

## 2019-06-11 MED ORDER — MUPIROCIN 2 % EX OINT: 1.0000 "application " | TOPICAL_OINTMENT | Freq: Two times a day (BID) | CUTANEOUS | 0 refills | Status: DC

## 2019-06-11 NOTE — Telephone Encounter (Signed)
Tried to call pt- left a message on cell voicemail. I have called dermatology and left 2 messages for the front to call with appt for next week. I also left on voicemail if she hasn't heard from them or me by Tuesday of next week, she needs to call me.

## 2019-06-11 NOTE — Telephone Encounter (Signed)
Copied from [place] [id number]. Topic: General - Other >> Jun 11, 2019  1:43 PM Leim Fabry wrote: Reason for CRM: Patient called to see if appointment had been set up by PCP to see dermatologist

## 2019-06-11 NOTE — Progress Notes (Signed)
Date:  06/11/2019   Name:  Emily Boyer   DOB:  1945/11/15   MRN:  [id number]   Chief Complaint: Tick Removal (Follow up from back tick bite and cellulitis of buttocks. Still itching "like crazy" on back. )  Patient is a 74 year old female  who presents for followup of cellulitis from tick bite exam. The patient reports the following problems: area of tick bite itching and discomfort. Health maintenance has been reviewed up to date.   Lab Results  Component Value Date   CREATININE 0.85 01/12/2019   BUN 19 01/12/2019   NA 137 01/12/2019   K 3.8 01/12/2019   CL 95 (L) 01/12/2019   CO2 29 01/12/2019   Lab Results  Component Value Date   CHOL 268 (H) 10/12/2018   HDL 53 10/12/2018   LDLCALC 190 (H) 10/12/2018   TRIG 138 10/12/2018   CHOLHDL 5.3 (H) 12/04/2016   Lab Results  Component Value Date   TSH 1.150 01/12/2019   No results found for: HGBA1C Lab Results  Component Value Date   WBC 11.0 (H) 01/12/2019   HGB 13.9 01/12/2019   HCT 41.3 01/12/2019   MCV 88 01/12/2019   PLT 335 01/12/2019   Lab Results  Component Value Date   ALT 20 01/12/2019   AST 22 01/12/2019   ALKPHOS 84 01/12/2019   BILITOT 0.3 01/12/2019     Review of Systems  Skin: Positive for color change and wound.    Patient Active Problem List   Diagnosis Date Noted  . DDD (degenerative disc disease), cervical 05/28/2018  . Sebaceous cyst 12/20/2015  . Abnormal findings-gastrointestinal tract   . Encounter for general adult medical examination without abnormal findings 07/04/2014  . Nerve root pain 07/04/2014  . Gastroesophageal reflux disease 07/04/2014  . HLD (hyperlipidemia) 07/04/2014  . Hypertension 07/04/2014    Allergies  Allergen Reactions  . Guaifenesin & Derivatives   . Methocarbamol Itching  . Penicillins Itching  . Tramadol Hcl Itching    Past Surgical History:  Procedure Laterality Date  . BREAST BIOPSY Right 2004   neg  . CATARACT EXTRACTION W/PHACO Right  04/29/2017   Procedure: CATARACT EXTRACTION PHACO AND INTRAOCULAR LENS PLACEMENT (IOC) RIGHT;  Surgeon: Eulogio Bear, MD;  Location: [facility];  Service: Ophthalmology;  Laterality: Right;  . CATARACT EXTRACTION W/PHACO Left 07/15/2017   Procedure: CATARACT EXTRACTION PHACO AND INTRAOCULAR LENS PLACEMENT ([place]) LEFT;  Surgeon: Eulogio Bear, MD;  Location: [facility];  Service: Ophthalmology;  Laterality: Left;  . COLONOSCOPY WITH PROPOFOL N/A 02/06/2015   Procedure: COLONOSCOPY WITH PROPOFOL;  Surgeon: [name], MD;  Location: [place];  Service: Endoscopy;  Laterality: N/A;  . EYE SURGERY  2019  . TUBAL LIGATION  1980s  . VAGINAL HYSTERECTOMY  1990s   one ovary removed as well    Social History   Tobacco Use  . Smoking status: Former Smoker    Packs/day: 0.50    Years: 50.00    Pack years: 25.00    Types: Cigarettes, E-cigarettes    Quit date: 11/22/2011    Years since quitting: 7.5  . Smokeless tobacco: Never Used  . Tobacco comment: quit 2014  Substance Use Topics  . Alcohol use: Yes    Alcohol/week: 2.0 standard drinks    Types: 2 Glasses of wine per week  . Drug use: No     Medication list has been reviewed and updated.  Current Meds  Medication Sig  . aspirin EC 81 MG tablet Take by mouth.  Marland Kitchen atorvastatin (LIPITOR) 10 MG tablet Take 1 tablet (10 mg total) by mouth daily.  . cholecalciferol (VITAMIN D) 1000 units tablet Take 1,000 Units by mouth daily.  Marland Kitchen losartan-hydrochlorothiazide (HYZAAR) 50-12.5 MG tablet Take 1 tablet by mouth daily.  . Multiple Vitamins-Minerals (CENTRUM SILVER 50+WOMEN) TABS Take by mouth.  . Omega 3 1000 MG CAPS Take 1 capsule by mouth daily.  . pantoprazole (PROTONIX) 40 MG tablet Take 1 tablet (40 mg total) by mouth daily.    PHQ 2/9 Scores 06/11/2019 05/31/2019 02/08/2019 10/12/2018  PHQ - 2 Score 0 0 0 0  PHQ- 9 Score 0 0 - 0    BP Readings from Last 3 Encounters:  06/11/19 128/64  05/31/19  130/64  04/02/19 120/80    Physical Exam Skin:    Findings: Erythema, rash and wound present. Rash is purpuric.          Comments: Mild erythema/whitish firm area/?scar/ possible purulence/ area cleaned with alcohol/curretage/redressed.     Wt Readings from Last 3 Encounters:  06/11/19 146 lb (66.2 kg)  05/31/19 148 lb (67.1 kg)  04/02/19 146 lb (66.2 kg)    BP 128/64   Pulse 67   Ht 5\' 1"  (1.549 m)   Wt 146 lb (66.2 kg)   SpO2 99%   BMI 27.59 kg/m   Assessment and Plan: 1. Cellulitis of back except buttock Recheck of area was noted to have surrounding erythema some ecchymosis with tenderness.  Removing the overlying epithelium noted a whitish area which was consistent with scar tissue this was curettaged and the area beneath was examined for any foreign body such as remaining insect.  Area was cleaned and was redressed with triple antibiotic and patient was put on triamcinolone and Bactroban to be used together with a Band-Aid and referral was made to dermatology. - mupirocin ointment (BACTROBAN) 2 %; Apply 1 application topically 2 (two) times daily.  Dispense: 22 g; Refill: 0 - sulfamethoxazole-trimethoprim (BACTRIM DS) 800-160 MG tablet; Take 1 tablet by mouth 2 (two) times daily.  Dispense: 20 tablet; Refill: 0  2. Tick bite, subsequent encounter Patient has not manifested any subsequent illnesses such as rash of wrist and ankles, headache, or fever. - triamcinolone cream (KENALOG) 0.1 %; Apply 1 application topically 2 (two) times daily.  Dispense: 30 g; Refill: 0 - Ambulatory referral to Dermatology

## 2019-06-14 ENCOUNTER — Telehealth: Payer: Self-pay | Admitting: Family Medicine

## 2019-06-14 NOTE — Telephone Encounter (Signed)
Scheduled appt for June 24 @ 9:20- also put on waiting list- pt notified

## 2019-06-14 NOTE — Telephone Encounter (Signed)
Copied from [place] [id number]. Topic: General - Other >> Jun 14, 2019  3:42 PM Hinda Lenis D wrote: Reason for CRM: PT states Dr Phillip Heal " dermatologies" did not call her / please advise

## 2019-06-28 DIAGNOSIS — S0096XA Insect bite (nonvenomous) of unspecified part of head, initial encounter: Secondary | ICD-10-CM | POA: Diagnosis not present

## 2019-07-09 DIAGNOSIS — H43811 Vitreous degeneration, right eye: Secondary | ICD-10-CM | POA: Diagnosis not present

## 2019-08-02 ENCOUNTER — Other Ambulatory Visit: Payer: Self-pay | Admitting: Family Medicine

## 2019-08-02 DIAGNOSIS — E782 Mixed hyperlipidemia: Secondary | ICD-10-CM

## 2019-08-02 DIAGNOSIS — M7541 Impingement syndrome of right shoulder: Secondary | ICD-10-CM | POA: Diagnosis not present

## 2019-08-02 DIAGNOSIS — M5030 Other cervical disc degeneration, unspecified cervical region: Secondary | ICD-10-CM | POA: Diagnosis not present

## 2019-08-02 DIAGNOSIS — K219 Gastro-esophageal reflux disease without esophagitis: Secondary | ICD-10-CM

## 2019-08-02 DIAGNOSIS — M5412 Radiculopathy, cervical region: Secondary | ICD-10-CM | POA: Diagnosis not present

## 2019-08-12 ENCOUNTER — Ambulatory Visit: Payer: Medicare HMO | Admitting: Family Medicine

## 2019-08-13 DIAGNOSIS — H43811 Vitreous degeneration, right eye: Secondary | ICD-10-CM | POA: Diagnosis not present

## 2019-08-20 ENCOUNTER — Encounter: Payer: Self-pay | Admitting: Family Medicine

## 2019-08-20 ENCOUNTER — Other Ambulatory Visit: Payer: Self-pay

## 2019-08-20 ENCOUNTER — Ambulatory Visit (INDEPENDENT_AMBULATORY_CARE_PROVIDER_SITE_OTHER): Payer: Medicare HMO | Admitting: Family Medicine

## 2019-08-20 VITALS — BP 120/64 | HR 68 | Ht 61.0 in | Wt 149.0 lb

## 2019-08-20 DIAGNOSIS — E034 Atrophy of thyroid (acquired): Secondary | ICD-10-CM | POA: Diagnosis not present

## 2019-08-20 DIAGNOSIS — R5383 Other fatigue: Secondary | ICD-10-CM | POA: Diagnosis not present

## 2019-08-20 DIAGNOSIS — E782 Mixed hyperlipidemia: Secondary | ICD-10-CM | POA: Diagnosis not present

## 2019-08-20 DIAGNOSIS — I10 Essential (primary) hypertension: Secondary | ICD-10-CM | POA: Diagnosis not present

## 2019-08-20 DIAGNOSIS — K219 Gastro-esophageal reflux disease without esophagitis: Secondary | ICD-10-CM | POA: Diagnosis not present

## 2019-08-20 MED ORDER — PANTOPRAZOLE SODIUM 40 MG PO TBEC: 40.0000 mg | DELAYED_RELEASE_TABLET | Freq: Every day | ORAL | 1 refills | Status: DC

## 2019-08-20 MED ORDER — LOSARTAN POTASSIUM-HCTZ 50-12.5 MG PO TABS: 1.0000 | ORAL_TABLET | Freq: Every day | ORAL | 1 refills | Status: DC

## 2019-08-20 MED ORDER — ATORVASTATIN CALCIUM 10 MG PO TABS: 10.0000 mg | ORAL_TABLET | Freq: Every day | ORAL | 1 refills | Status: DC

## 2019-08-20 NOTE — Progress Notes (Signed)
Date:  08/20/2019   Name:  Emily Boyer   DOB:  1945-06-20   MRN:  [id number]   Chief Complaint: Hyperlipidemia, Hypertension, and Gastroesophageal Reflux  Hyperlipidemia This is a chronic problem. The current episode started more than 1 year ago. The problem is controlled. Recent lipid tests were reviewed and are normal. She has no history of chronic renal disease, diabetes, hypothyroidism, liver disease, obesity or nephrotic syndrome. Factors aggravating her hyperlipidemia include thiazides. Pertinent negatives include no chest pain, focal sensory loss, focal weakness, leg pain, myalgias or shortness of breath. The current treatment provides moderate improvement of lipids. There are no compliance problems.  Risk factors for coronary artery disease include dyslipidemia and hypertension.  Hypertension This is a chronic problem. The current episode started more than 1 year ago. The problem has been waxing and waning since onset. The problem is controlled. Pertinent negatives include no anxiety, blurred vision, chest pain, headaches, malaise/fatigue, neck pain, orthopnea, palpitations, peripheral edema, PND, shortness of breath or sweats. There are no associated agents to hypertension. Risk factors for coronary artery disease include dyslipidemia. Past treatments include ACE inhibitors and diuretics. The current treatment provides moderate improvement. There are no compliance problems.  There is no history of angina, kidney disease, CAD/MI, CVA, heart failure, left ventricular hypertrophy, PVD or retinopathy. Identifiable causes of hypertension include a thyroid problem. There is no history of chronic renal disease, a hypertension causing med or renovascular disease.  Gastroesophageal Reflux She reports no abdominal pain, no belching, no chest pain, no choking, no coughing, no dysphagia, no early satiety, no globus sensation, no heartburn, no hoarse voice, no nausea, no sore throat, no stridor, no  tooth decay, no water brash or no wheezing. This is a chronic problem. The current episode started more than 1 year ago. The problem occurs occasionally. The problem has been waxing and waning. The symptoms are aggravated by certain foods. Associated symptoms include fatigue. Pertinent negatives include no anemia, melena, muscle weakness, orthopnea or weight loss. There are no known risk factors. She has tried a PPI for the symptoms. The treatment provided moderate relief. Past procedures do not include an abdominal ultrasound, esophageal pH monitoring or a UGI.  Thyroid Problem Presents for follow-up visit. Symptoms include fatigue. Patient reports no anxiety, cold intolerance, constipation, depressed mood, diaphoresis, diarrhea, heat intolerance, hoarse voice, menstrual problem, palpitations or weight loss. The symptoms have been stable. Her past medical history is significant for hyperlipidemia. There is no history of diabetes or heart failure.    Lab Results  Component Value Date   CREATININE 0.85 01/12/2019   BUN 19 01/12/2019   NA 137 01/12/2019   K 3.8 01/12/2019   CL 95 (L) 01/12/2019   CO2 29 01/12/2019   Lab Results  Component Value Date   CHOL 268 (H) 10/12/2018   HDL 53 10/12/2018   LDLCALC 190 (H) 10/12/2018   TRIG 138 10/12/2018   CHOLHDL 5.3 (H) 12/04/2016   Lab Results  Component Value Date   TSH 1.150 01/12/2019   No results found for: HGBA1C Lab Results  Component Value Date   WBC 11.0 (H) 01/12/2019   HGB 13.9 01/12/2019   HCT 41.3 01/12/2019   MCV 88 01/12/2019   PLT 335 01/12/2019   Lab Results  Component Value Date   ALT 20 01/12/2019   AST 22 01/12/2019   ALKPHOS 84 01/12/2019   BILITOT 0.3 01/12/2019     Review of Systems  Constitutional: Positive for fatigue. Negative  for chills, diaphoresis, fever, malaise/fatigue, unexpected weight change and weight loss.  HENT: Negative for congestion, ear discharge, ear pain, hoarse voice, rhinorrhea, sinus  pressure, sneezing and sore throat.   Eyes: Negative for blurred vision, photophobia, pain, discharge, redness and itching.  Respiratory: Negative for cough, choking, shortness of breath, wheezing and stridor.   Cardiovascular: Negative for chest pain, palpitations, orthopnea and PND.  Gastrointestinal: Negative for abdominal pain, blood in stool, constipation, diarrhea, dysphagia, heartburn, melena, nausea and vomiting.  Endocrine: Negative for cold intolerance, heat intolerance, polydipsia, polyphagia and polyuria.  Genitourinary: Negative for dysuria, flank pain, frequency, hematuria, menstrual problem, pelvic pain, urgency, vaginal bleeding and vaginal discharge.  Musculoskeletal: Negative for arthralgias, back pain, myalgias, muscle weakness and neck pain.  Skin: Negative for rash.  Allergic/Immunologic: Negative for environmental allergies and food allergies.  Neurological: Negative for dizziness, focal weakness, weakness, light-headedness, numbness and headaches.  Hematological: Negative for adenopathy. Does not bruise/bleed easily.  Psychiatric/Behavioral: Negative for dysphoric mood. The patient is not nervous/anxious.     Patient Active Problem List   Diagnosis Date Noted  . DDD (degenerative disc disease), cervical 05/28/2018  . Sebaceous cyst 12/20/2015  . Abnormal findings-gastrointestinal tract   . Encounter for general adult medical examination without abnormal findings 07/04/2014  . Nerve root pain 07/04/2014  . Gastroesophageal reflux disease 07/04/2014  . HLD (hyperlipidemia) 07/04/2014  . Hypertension 07/04/2014    Allergies  Allergen Reactions  . Guaifenesin & Derivatives   . Methocarbamol Itching  . Penicillins Itching  . Tramadol Hcl Itching    Past Surgical History:  Procedure Laterality Date  . BREAST BIOPSY Right 2004   neg  . CATARACT EXTRACTION W/PHACO Right 04/29/2017   Procedure: CATARACT EXTRACTION PHACO AND INTRAOCULAR LENS PLACEMENT ([facility]) RIGHT;   Surgeon: Eulogio Bear, MD;  Location: [facility];  Service: Ophthalmology;  Laterality: Right;  . CATARACT EXTRACTION W/PHACO Left 07/15/2017   Procedure: CATARACT EXTRACTION PHACO AND INTRAOCULAR LENS PLACEMENT ([facility]) LEFT;  Surgeon: Eulogio Bear, MD;  Location: [facility];  Service: Ophthalmology;  Laterality: Left;  . COLONOSCOPY WITH PROPOFOL N/A 02/06/2015   Procedure: COLONOSCOPY WITH PROPOFOL;  Surgeon: [name], MD;  Location: [facility];  Service: Endoscopy;  Laterality: N/A;  . EYE SURGERY  2019  . TUBAL LIGATION  1980s  . VAGINAL HYSTERECTOMY  1990s   one ovary removed as well    Social History   Tobacco Use  . Smoking status: Former Smoker    Packs/day: 0.50    Years: 50.00    Pack years: 25.00    Types: Cigarettes, E-cigarettes    Quit date: 11/22/2011    Years since quitting: 7.7  . Smokeless tobacco: Never Used  . Tobacco comment: quit 2014  Vaping Use  . Vaping Use: Never used  Substance Use Topics  . Alcohol use: Yes    Alcohol/week: 2.0 standard drinks    Types: 2 Glasses of wine per week  . Drug use: No     Medication list has been reviewed and updated.  Current Meds  Medication Sig  . aspirin EC 81 MG tablet Take by mouth.  Marland Kitchen atorvastatin (LIPITOR) 10 MG tablet Take 1 tablet by mouth once daily  . cholecalciferol (VITAMIN D) 1000 units tablet Take 1,000 Units by mouth daily.  Marland Kitchen losartan-hydrochlorothiazide (HYZAAR) 50-12.5 MG tablet Take 1 tablet by mouth daily.  . Multiple Vitamins-Minerals (CENTRUM SILVER 50+WOMEN) TABS Take by mouth.  . Omega 3 1000 MG CAPS Take 1  capsule by mouth daily.  . pantoprazole (PROTONIX) 40 MG tablet Take 1 tablet by mouth once daily  . triamcinolone cream (KENALOG) 0.1 % Apply 1 application topically 2 (two) times daily.  . [DISCONTINUED] mupirocin ointment (BACTROBAN) 2 % Apply 1 application topically 2 (two) times daily.    PHQ 2/9 Scores 08/20/2019 06/11/2019 05/31/2019 02/08/2019    PHQ - 2 Score 0 0 0 0  PHQ- 9 Score 0 0 0 -    GAD 7 : Generalized Anxiety Score 08/20/2019 06/11/2019 05/31/2019 10/12/2018  Nervous, Anxious, on Edge 0 0 0 0  Control/stop worrying 0 0 0 0  Worry too much - different things 0 0 0 0  Trouble relaxing 0 0 0 0  Restless 0 0 0 0  Easily annoyed or irritable 0 0 0 0  Afraid - awful might happen 0 0 0 0  Total GAD 7 Score 0 0 0 0  Anxiety Difficulty - Not difficult at all - -    BP Readings from Last 3 Encounters:  08/20/19 (!) 120/64  06/11/19 128/64  05/31/19 130/64    Physical Exam Vitals and nursing note reviewed.  Constitutional:      Appearance: She is well-developed.  HENT:     Head: Normocephalic.     Right Ear: Tympanic membrane, ear canal and external ear normal.     Left Ear: Tympanic membrane, ear canal and external ear normal.     Nose: Nose normal.  Eyes:     General: Lids are everted, no foreign bodies appreciated. No scleral icterus.       Left eye: No foreign body or hordeolum.     Conjunctiva/sclera: Conjunctivae normal.     Right eye: Right conjunctiva is not injected.     Left eye: Left conjunctiva is not injected.     Pupils: Pupils are equal, round, and reactive to light.  Neck:     Thyroid: No thyromegaly.     Vascular: No JVD.     Trachea: No tracheal deviation.  Cardiovascular:     Rate and Rhythm: Normal rate and regular rhythm.     Heart sounds: Normal heart sounds. No murmur heard.  No friction rub. No gallop.   Pulmonary:     Effort: Pulmonary effort is normal. No respiratory distress.     Breath sounds: Normal breath sounds. No wheezing, rhonchi or rales.  Abdominal:     General: Bowel sounds are normal.     Palpations: Abdomen is soft. There is no mass.     Tenderness: There is no abdominal tenderness. There is no guarding or rebound.  Musculoskeletal:        General: No tenderness. Normal range of motion.     Cervical back: Normal range of motion and neck supple.  Lymphadenopathy:      Cervical: No cervical adenopathy.  Skin:    General: Skin is warm.     Findings: No rash.  Neurological:     Mental Status: She is alert and oriented to person, place, and time.     Cranial Nerves: No cranial nerve deficit.     Deep Tendon Reflexes: Reflexes normal.  Psychiatric:        Mood and Affect: Mood is not anxious or depressed.     Wt Readings from Last 3 Encounters:  08/20/19 149 lb (67.6 kg)  06/11/19 146 lb (66.2 kg)  05/31/19 148 lb (67.1 kg)    BP (!) 120/64   Pulse 68   Ht  5\' 1"  (1.549 m)   Wt 149 lb (67.6 kg)   BMI 28.15 kg/m   Assessment and Plan:   1. Mixed hyperlipidemia Chronic.  Controlled.  Stable.  Continue atorvastatin 10 mg once a day.  Will check lipid panel for current LDL status. - atorvastatin (LIPITOR) 10 MG tablet; Take 1 tablet (10 mg total) by mouth daily.  Dispense: 180 tablet; Refill: 1 - Lipid Panel With LDL/HDL Ratio  2. Essential hypertension Chronic.  Controlled.  Stable.  Continue losartan hydrochlorothiazide 50-12.5 mg once a day.  Will check CMP electrolytes and GFR concerns. - losartan-hydrochlorothiazide (HYZAAR) 50-12.5 MG tablet; Take 1 tablet by mouth daily.  Dispense: 90 tablet; Refill: 1 - Comprehensive Metabolic Panel (CMET)  3. Gastroesophageal reflux disease, unspecified whether esophagitis present Chronic.  Controlled.  Stable.  Continue pantoprazole 40 mg once a day. - pantoprazole (PROTONIX) 40 MG tablet; Take 1 tablet (40 mg total) by mouth daily.  Dispense: 180 tablet; Refill: 1  4. Fatigue This is been subclinical in the past not requiring supplementation with levothyroxine will evaluate with TSH and panel for current level of control. - Thyroid Panel With TSH

## 2019-08-21 LAB — COMPREHENSIVE METABOLIC PANEL
ALT: 17 IU/L (ref 0–32)
AST: 19 IU/L (ref 0–40)
Albumin/Globulin Ratio: 1.6 (ref 1.2–2.2)
Albumin: 4.6 g/dL (ref 3.7–4.7)
Alkaline Phosphatase: 80 IU/L (ref 48–121)
BUN/Creatinine Ratio: 22 (ref 12–28)
BUN: 24 mg/dL (ref 8–27)
Bilirubin Total: 0.3 mg/dL (ref 0.0–1.2)
CO2: 26 mmol/L (ref 20–29)
Calcium: 10.4 mg/dL — ABNORMAL HIGH (ref 8.7–10.3)
Chloride: 98 mmol/L (ref 96–106)
Creatinine, Ser: 1.09 mg/dL — ABNORMAL HIGH (ref 0.57–1.00)
GFR calc Af Amer: 59 mL/min/{1.73_m2} — ABNORMAL LOW (ref >59)
GFR calc non Af Amer: 51 mL/min/{1.73_m2} — ABNORMAL LOW (ref >59)
Globulin, Total: 2.9 g/dL (ref 1.5–4.5)
Glucose: 89 mg/dL (ref 65–99)
Potassium: 4.7 mmol/L (ref 3.5–5.2)
Sodium: 139 mmol/L (ref 134–144)
Total Protein: 7.5 g/dL (ref 6.0–8.5)

## 2019-08-21 LAB — LIPID PANEL WITH LDL/HDL RATIO
Cholesterol, Total: 249 mg/dL — ABNORMAL HIGH (ref 100–199)
HDL: 61 mg/dL (ref >39)
LDL Chol Calc (NIH): 158 mg/dL — ABNORMAL HIGH (ref 0–99)
LDL/HDL Ratio: 2.6 ratio (ref 0.0–3.2)
Triglycerides: 169 mg/dL — ABNORMAL HIGH (ref 0–149)
VLDL Cholesterol Cal: 30 mg/dL (ref 5–40)

## 2019-08-21 LAB — THYROID PANEL WITH TSH
Free Thyroxine Index: 2.1 (ref 1.2–4.9)
T3 Uptake Ratio: 28 % (ref 24–39)
T4, Total: 7.5 ug/dL (ref 4.5–12.0)
TSH: 1.24 u[IU]/mL (ref 0.450–4.500)

## 2019-10-27 DIAGNOSIS — Z20822 Contact with and (suspected) exposure to covid-19: Secondary | ICD-10-CM | POA: Diagnosis not present

## 2019-11-03 ENCOUNTER — Ambulatory Visit (INDEPENDENT_AMBULATORY_CARE_PROVIDER_SITE_OTHER): Payer: Medicare HMO

## 2019-11-03 ENCOUNTER — Other Ambulatory Visit: Payer: Self-pay

## 2019-11-03 DIAGNOSIS — Z23 Encounter for immunization: Secondary | ICD-10-CM | POA: Diagnosis not present

## 2019-11-08 DIAGNOSIS — M5412 Radiculopathy, cervical region: Secondary | ICD-10-CM | POA: Diagnosis not present

## 2019-11-08 DIAGNOSIS — M7541 Impingement syndrome of right shoulder: Secondary | ICD-10-CM | POA: Diagnosis not present

## 2019-11-08 DIAGNOSIS — M5030 Other cervical disc degeneration, unspecified cervical region: Secondary | ICD-10-CM | POA: Diagnosis not present

## 2019-11-30 ENCOUNTER — Ambulatory Visit (INDEPENDENT_AMBULATORY_CARE_PROVIDER_SITE_OTHER): Payer: Medicare HMO | Admitting: Family Medicine

## 2019-11-30 ENCOUNTER — Encounter: Payer: Self-pay | Admitting: Family Medicine

## 2019-11-30 ENCOUNTER — Other Ambulatory Visit: Payer: Self-pay

## 2019-11-30 VITALS — BP 138/80 | HR 68 | Ht 61.0 in | Wt 147.0 lb

## 2019-11-30 DIAGNOSIS — K5792 Diverticulitis of intestine, part unspecified, without perforation or abscess without bleeding: Secondary | ICD-10-CM | POA: Diagnosis not present

## 2019-11-30 DIAGNOSIS — N952 Postmenopausal atrophic vaginitis: Secondary | ICD-10-CM

## 2019-11-30 LAB — POCT URINALYSIS DIPSTICK
Bilirubin, UA: NEGATIVE
Blood, UA: NEGATIVE
Glucose, UA: NEGATIVE
Ketones, UA: NEGATIVE
Leukocytes, UA: NEGATIVE
Nitrite, UA: NEGATIVE
Protein, UA: NEGATIVE
Spec Grav, UA: 1.01 (ref 1.010–1.025)
Urobilinogen, UA: 0.2 E.U./dL
pH, UA: 5 (ref 5.0–8.0)

## 2019-11-30 LAB — GLUCOSE, POCT (MANUAL RESULT ENTRY): POC Glucose: 112 mg/dl — AB (ref 70–99)

## 2019-11-30 MED ORDER — SULFAMETHOXAZOLE-TRIMETHOPRIM 800-160 MG PO TABS: 1.0000 | ORAL_TABLET | Freq: Two times a day (BID) | ORAL | 0 refills | Status: DC

## 2019-11-30 MED ORDER — METRONIDAZOLE 500 MG PO TABS: 500.0000 mg | ORAL_TABLET | Freq: Two times a day (BID) | ORAL | 0 refills | Status: DC

## 2019-11-30 NOTE — Progress Notes (Signed)
Date:  11/30/2019   Name:  Emily Boyer   DOB:  January 19, 1946   MRN:  [id number]   Chief Complaint: Vaginitis (has been taking monistat x 7 days- feels better, but not gone)    Lab Results  Component Value Date   CREATININE 1.09 (H) 08/20/2019   BUN 24 08/20/2019   NA 139 08/20/2019   K 4.7 08/20/2019   CL 98 08/20/2019   CO2 26 08/20/2019   Lab Results  Component Value Date   CHOL 249 (H) 08/20/2019   HDL 61 08/20/2019   LDLCALC 158 (H) 08/20/2019   TRIG 169 (H) 08/20/2019   CHOLHDL 5.3 (H) 12/04/2016   Lab Results  Component Value Date   TSH 1.240 08/20/2019   No results found for: HGBA1C Lab Results  Component Value Date   WBC 11.0 (H) 01/12/2019   HGB 13.9 01/12/2019   HCT 41.3 01/12/2019   MCV 88 01/12/2019   PLT 335 01/12/2019   Lab Results  Component Value Date   ALT 17 08/20/2019   AST 19 08/20/2019   ALKPHOS 80 08/20/2019   BILITOT 0.3 08/20/2019   Abdominal Pain Pertinent negatives include no anorexia, constipation, diarrhea, dysuria, fever, frequency, headaches, hematuria, nausea or vomiting.      Review of Systems  Constitutional: Negative for chills and fever.  HENT: Negative for sore throat.   Gastrointestinal: Positive for abdominal pain. Negative for anorexia, constipation, diarrhea, nausea and vomiting.  Genitourinary: Positive for vaginal discharge. Negative for dysuria, flank pain, frequency, hematuria and urgency.  Musculoskeletal: Negative for back pain and joint pain.  Skin: Negative for rash.  Neurological: Negative for headaches.    Patient Active Problem List   Diagnosis Date Noted  . DDD (degenerative disc disease), cervical 05/28/2018  . Sebaceous cyst 12/20/2015  . Abnormal findings-gastrointestinal tract   . Encounter for general adult medical examination without abnormal findings 07/04/2014  . Nerve root pain 07/04/2014  . Gastroesophageal reflux disease 07/04/2014  . HLD (hyperlipidemia) 07/04/2014  .  Hypertension 07/04/2014    Allergies  Allergen Reactions  . Guaifenesin & Derivatives   . Methocarbamol Itching  . Penicillins Itching  . Tramadol Hcl Itching    Past Surgical History:  Procedure Laterality Date  . BREAST BIOPSY Right 2004   neg  . CATARACT EXTRACTION W/PHACO Right 04/29/2017   Procedure: CATARACT EXTRACTION PHACO AND INTRAOCULAR LENS PLACEMENT ([facility]) RIGHT;  Surgeon: Eulogio Bear, MD;  Location: [facility];  Service: Ophthalmology;  Laterality: Right;  . CATARACT EXTRACTION W/PHACO Left 07/15/2017   Procedure: CATARACT EXTRACTION PHACO AND INTRAOCULAR LENS PLACEMENT ([facility]) LEFT;  Surgeon: Eulogio Bear, MD;  Location: [facility];  Service: Ophthalmology;  Laterality: Left;  . COLONOSCOPY WITH PROPOFOL N/A 02/06/2015   Procedure: COLONOSCOPY WITH PROPOFOL;  Surgeon: [name], MD;  Location: [facility];  Service: Endoscopy;  Laterality: N/A;  . EYE SURGERY  2019  . TUBAL LIGATION  1980s  . VAGINAL HYSTERECTOMY  1990s   one ovary removed as well    Social History   Tobacco Use  . Smoking status: Former Smoker    Packs/day: 0.50    Years: 50.00    Pack years: 25.00    Types: Cigarettes, E-cigarettes    Quit date: 11/22/2011    Years since quitting: 8.0  . Smokeless tobacco: Never Used  . Tobacco comment: quit 2014  Vaping Use  . Vaping Use: Never used  Substance Use Topics  . Alcohol use:  Yes    Alcohol/week: 2.0 standard drinks    Types: 2 Glasses of wine per week  . Drug use: No     Medication list has been reviewed and updated.  Current Meds  Medication Sig  . aspirin EC 81 MG tablet Take by mouth.  Marland Kitchen atorvastatin (LIPITOR) 10 MG tablet Take 1 tablet (10 mg total) by mouth daily.  . cholecalciferol (VITAMIN D) 1000 units tablet Take 1,000 Units by mouth daily.  Marland Kitchen losartan-hydrochlorothiazide (HYZAAR) 50-12.5 MG tablet Take 1 tablet by mouth daily.  . Multiple Vitamins-Minerals (CENTRUM SILVER 50+WOMEN) TABS  Take by mouth.  . Omega 3 1000 MG CAPS Take 1 capsule by mouth daily.  . pantoprazole (PROTONIX) 40 MG tablet Take 1 tablet (40 mg total) by mouth daily.  . Zinc Oxide-Vitamin C (ZINC PLUS VITAMIN C PO) Take 1 tablet by mouth daily.  . [DISCONTINUED] triamcinolone cream (KENALOG) 0.1 % Apply 1 application topically 2 (two) times daily.    PHQ 2/9 Scores 11/30/2019 08/20/2019 06/11/2019 05/31/2019  PHQ - 2 Score 0 0 0 0  PHQ- 9 Score 0 0 0 0    GAD 7 : Generalized Anxiety Score 11/30/2019 08/20/2019 06/11/2019 05/31/2019  Nervous, Anxious, on Edge 0 0 0 0  Control/stop worrying 0 0 0 0  Worry too much - different things 0 0 0 0  Trouble relaxing 0 0 0 0  Restless 0 0 0 0  Easily annoyed or irritable 0 0 0 0  Afraid - awful might happen 0 0 0 0  Total GAD 7 Score 0 0 0 0  Anxiety Difficulty - - Not difficult at all -    BP Readings from Last 3 Encounters:  11/30/19 138/80  08/20/19 (!) 120/64  06/11/19 128/64    Physical Exam Vitals and nursing note reviewed.  Constitutional:      General: She is not in acute distress.    Appearance: She is not diaphoretic.  HENT:     Head: Normocephalic and atraumatic.     Right Ear: External ear normal.     Left Ear: External ear normal.     Nose: Nose normal.  Eyes:     General:        Right eye: No discharge.        Left eye: No discharge.     Conjunctiva/sclera: Conjunctivae normal.     Pupils: Pupils are equal, round, and reactive to light.  Neck:     Thyroid: No thyromegaly.     Vascular: No JVD.  Cardiovascular:     Rate and Rhythm: Normal rate and regular rhythm.     Heart sounds: Normal heart sounds. No murmur heard.  No friction rub. No gallop.   Pulmonary:     Effort: Pulmonary effort is normal.     Breath sounds: Normal breath sounds.  Abdominal:     General: Bowel sounds are normal.     Palpations: Abdomen is soft. There is no mass.     Tenderness: There is abdominal tenderness in the suprapubic area and left lower  quadrant. There is no right CVA tenderness, left CVA tenderness, guarding or rebound.  Musculoskeletal:        General: Normal range of motion.     Cervical back: Normal range of motion and neck supple.  Lymphadenopathy:     Cervical: No cervical adenopathy.  Skin:    General: Skin is warm and dry.  Neurological:     Mental Status: She is alert.  Deep Tendon Reflexes: Reflexes are normal and symmetric.     Wt Readings from Last 3 Encounters:  11/30/19 147 lb (66.7 kg)  08/20/19 149 lb (67.6 kg)  06/11/19 146 lb (66.2 kg)    BP 138/80   Pulse 68   Ht 5\' 1"  (1.549 m)   Wt 147 lb (66.7 kg)   BMI 27.78 kg/m   Assessment and Plan: 1. Diverticulitis New onset.  Persistent.  Stable.  On review of patient's colonoscopy patient does have a history of diverticular disease.  Patient's been having abdominal pain for 4 weeks.  There is tenderness in the left lower quadrant over the suprapubic area we will treat for diverticulitis with Septra DS 1 twice a day for 10 days and metronidazole 500 mg 1 twice a day for 10 days.  2. Atrophic vaginitis Review of patient's urinalysis and vaginal prep notes there is no infection of either UTI or vaginitis.  I suspect there may be some atrophic vaginitis and patient has been given sample of Premarin cream to use around the urethra nightly.

## 2019-11-30 NOTE — Patient Instructions (Signed)
Diverticulitis  Diverticulitis is infection or inflammation of small pouches (diverticula) in the colon that form due to a condition called diverticulosis. Diverticula can trap stool (feces) and bacteria, causing infection and inflammation. Diverticulitis may cause severe stomach pain and diarrhea. It may lead to tissue damage in the colon that causes bleeding. The diverticula may also burst (rupture) and cause infected stool to enter other areas of the abdomen. Complications of diverticulitis can include:  Bleeding.  Severe infection.  Severe pain.  Rupture (perforation) of the colon.  Blockage (obstruction) of the colon. What are the causes? This condition is caused by stool becoming trapped in the diverticula, which allows bacteria to grow in the diverticula. This leads to inflammation and infection. What increases the risk? You are more likely to develop this condition if:  You have diverticulosis. The risk for diverticulosis increases if: ? You are overweight or obese. ? You use tobacco products. ? You do not get enough exercise.  You eat a diet that does not include enough fiber. High-fiber foods include fruits, vegetables, beans, nuts, and whole grains. What are the signs or symptoms? Symptoms of this condition may include:  Pain and tenderness in the abdomen. The pain is normally located on the left side of the abdomen, but it may occur in other areas.  Fever and chills.  Bloating.  Cramping.  Nausea.  Vomiting.  Changes in bowel routines.  Blood in your stool. How is this diagnosed? This condition is diagnosed based on:  Your medical history.  A physical exam.  Tests to make sure there is nothing else causing your condition. These tests may include: ? Blood tests. ? Urine tests. ? Imaging tests of the abdomen, including X-rays, ultrasounds, MRIs, or CT scans. How is this treated? Most cases of this condition are mild and can be treated at home.  Treatment may include:  Taking over-the-counter pain medicines.  Following a clear liquid diet.  Taking antibiotic medicines by mouth.  Rest. More severe cases may need to be treated at a hospital. Treatment may include:  Not eating or drinking.  Taking prescription pain medicine.  Receiving antibiotic medicines through an IV tube.  Receiving fluids and nutrition through an IV tube.  Surgery. When your condition is under control, your health care provider may recommend that you have a colonoscopy. This is an exam to look at the entire large intestine. During the exam, a lubricated, bendable tube is inserted into the anus and then passed into the rectum, colon, and other parts of the large intestine. A colonoscopy can show how severe your diverticula are and whether something else may be causing your symptoms. Follow these instructions at home: Medicines  Take over-the-counter and prescription medicines only as told by your health care provider. These include fiber supplements, probiotics, and stool softeners.  If you were prescribed an antibiotic medicine, take it as told by your health care provider. Do not stop taking the antibiotic even if you start to feel better.  Do not drive or use heavy machinery while taking prescription pain medicine. General instructions   Follow a full liquid diet or another diet as directed by your health care provider. After your symptoms improve, your health care provider may tell you to change your diet. He or she may recommend that you eat a diet that contains at least 25 g (25 grams) of fiber daily. Fiber makes it easier to pass stool. Healthy sources of fiber include: ? Berries. One cup contains 4-8 grams of   fiber. ? Beans or lentils. One half cup contains 5-8 grams of fiber. ? Green vegetables. One cup contains 4 grams of fiber.  Exercise for at least 30 minutes, 3 times each week. You should exercise hard enough to raise your heart rate and  break a sweat.  Keep all follow-up visits as told by your health care provider. This is important. You may need a colonoscopy. Contact a health care provider if:  Your pain does not improve.  You have a hard time drinking or eating food.  Your bowel movements do not return to normal. Get help right away if:  Your pain gets worse.  Your symptoms do not get better with treatment.  Your symptoms suddenly get worse.  You have a fever.  You vomit more than one time.  You have stools that are bloody, black, or tarry. Summary  Diverticulitis is infection or inflammation of small pouches (diverticula) in the colon that form due to a condition called diverticulosis. Diverticula can trap stool (feces) and bacteria, causing infection and inflammation.  You are at higher risk for this condition if you have diverticulosis and you eat a diet that does not include enough fiber.  Most cases of this condition are mild and can be treated at home. More severe cases may need to be treated at a hospital.  When your condition is under control, your health care provider may recommend that you have an exam called a colonoscopy. This exam can show how severe your diverticula are and whether something else may be causing your symptoms. This information is not intended to replace advice given to you by your health care provider. Make sure you discuss any questions you have with your health care provider. Document Revised: 12/20/2016 Document Reviewed: 02/10/2016 Elsevier Patient Education  2020 [employer]  

## 2019-12-07 IMAGING — MR MRI CERVICAL SPINE WITHOUT CONTRAST
5 series · 38 of 48 positions shown · non-contrast
Comparison: 12/18/2013

CLINICAL DATA: No known injury, right arm pain, numbness

EXAM:
MRI CERVICAL SPINE WITHOUT CONTRAST
TECHNIQUE: Multiplanar, multisequence MR imaging of the cervical spine was
performed. No intravenous contrast was administered.

[Series 5: T2 · sagittal · 3.0mm · 0.62mm/px · 8 of 15 slices shown (1 of 2)]
[im 1/15]
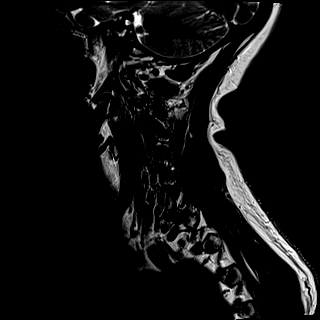
[im 3/15]
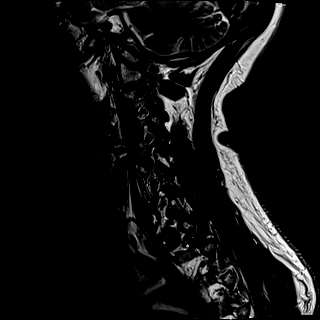
[im 5/15]
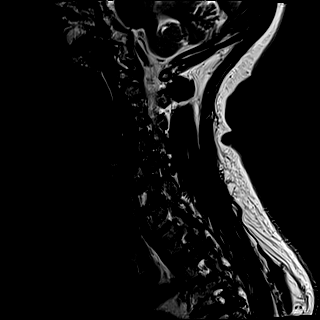
[im 7/15]
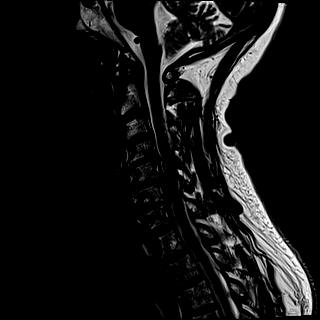
[im 9/15]
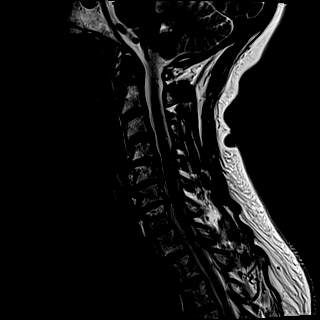
[im 11/15]
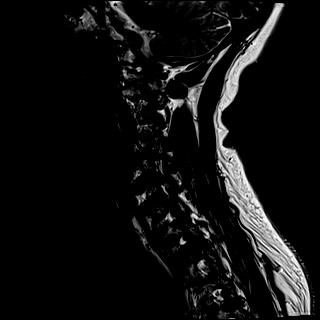
[im 13/15]
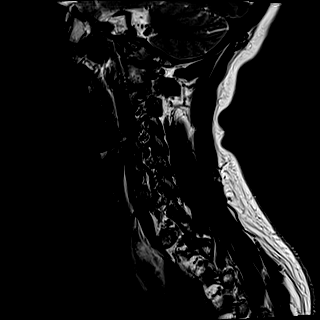
[im 15/15]
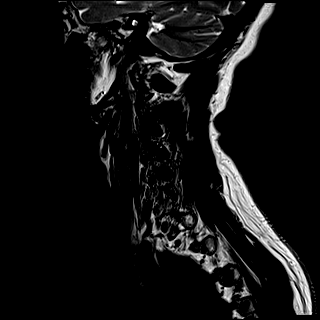

[Series 6: FLAIR · sagittal · 3.0mm · 0.78mm/px · 7 of 15 slices shown]
[im 1/15]
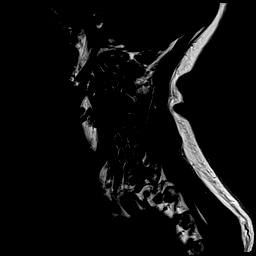
[im 3/15]
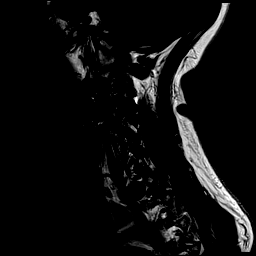
[im 5/15]
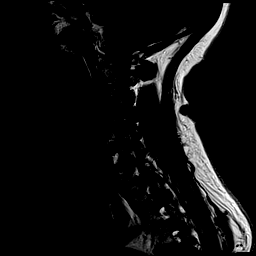
[im 8/15]
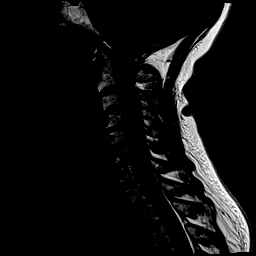
[im 10/15]
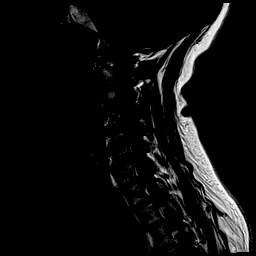
[im 12/15]
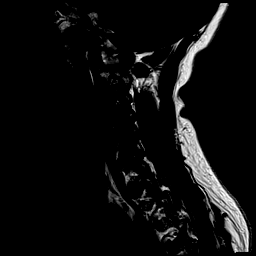
[im 15/15]
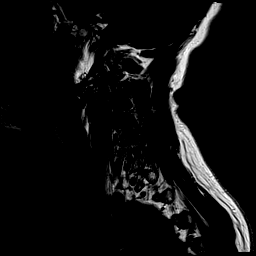

[Series 7: STIR · sagittal · 3.0mm · 0.62mm/px · 7 of 15 slices shown]
[im 1/15]
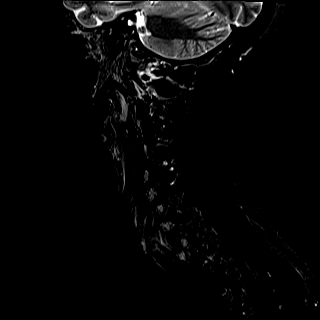
[im 3/15]
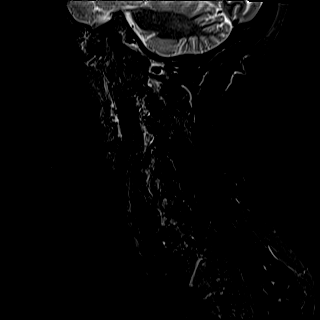
[im 5/15]
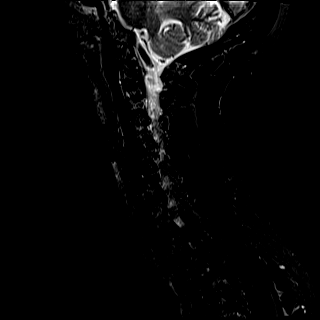
[im 8/15]
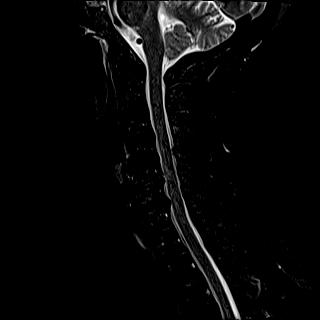
[im 10/15]
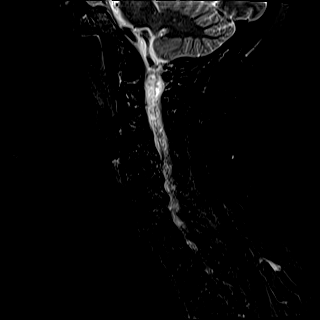
[im 12/15]
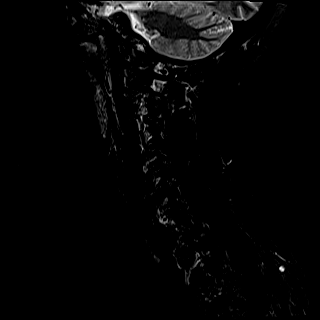
[im 15/15]
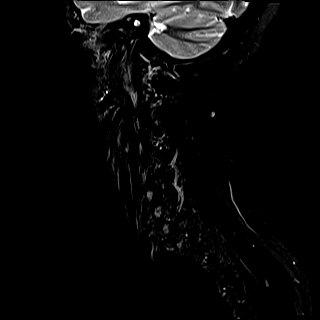

[Series 8: T2 · axial · 3.0mm · 0.70mm/px · z∈[-111,-28]mm · 9 of 26 slices shown (2 of 2)]
[im 1/26]
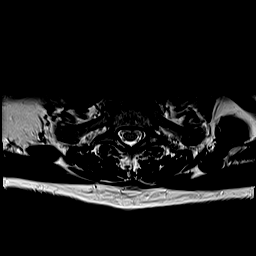
[im 5/26]
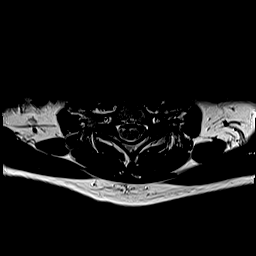
[im 9/26]
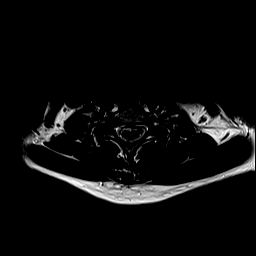
[im 11/26]
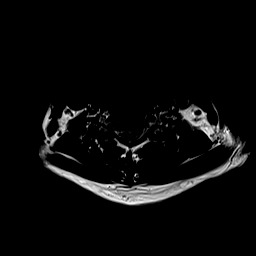
[im 13/26]
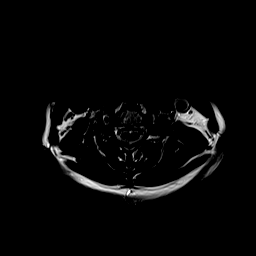
[im 15/26]
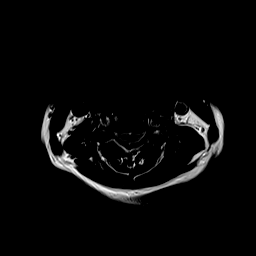
[im 17/26]
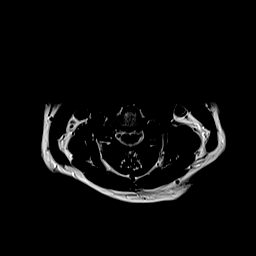
[im 21/26]
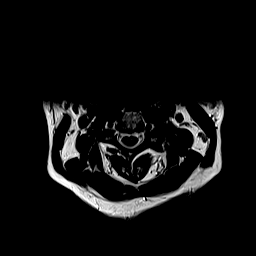
[im 26/26]
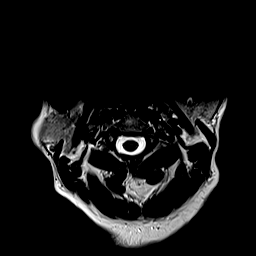

[Series 9: ax mpgr · axial · 3.0mm · 0.35mm/px · z∈[-111,-44]mm · 7 of 26 slices shown]
[im 1/26]
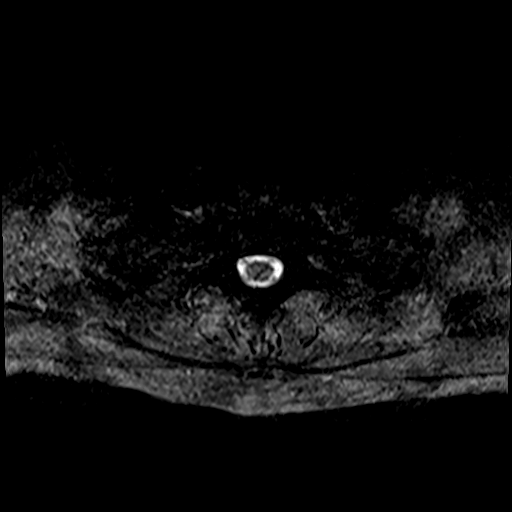
[im 5/26]
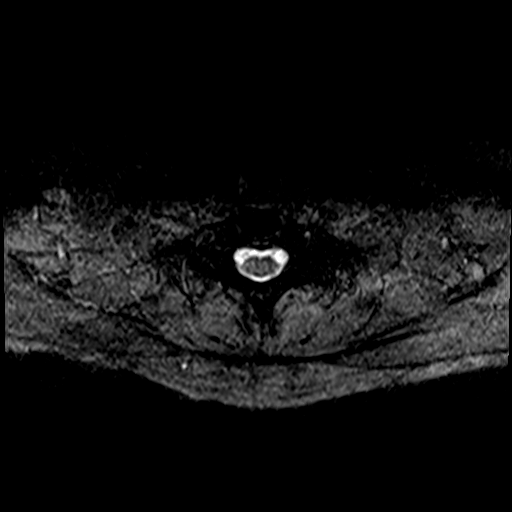
[im 9/26]
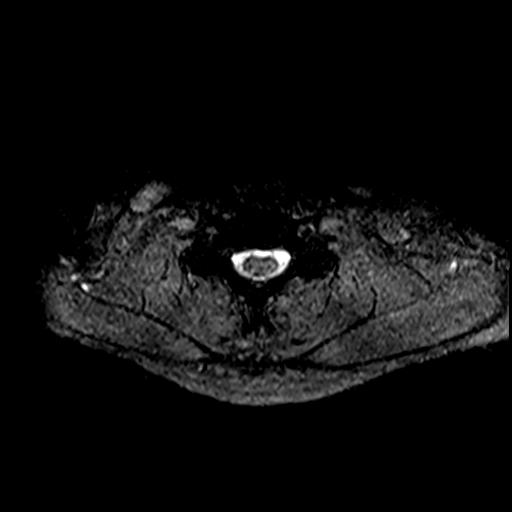
[im 11/26]
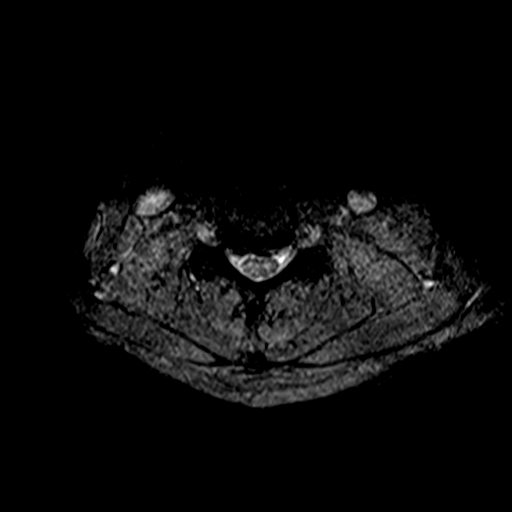
[im 15/26]
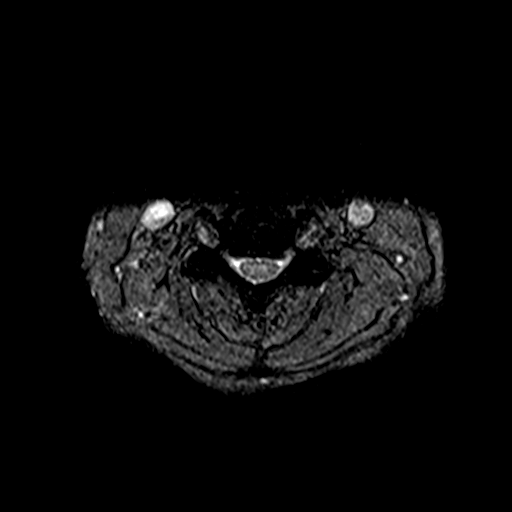
[im 17/26]
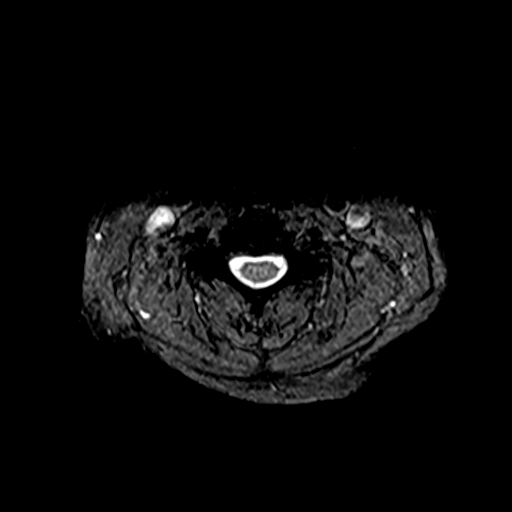
[im 21/26]
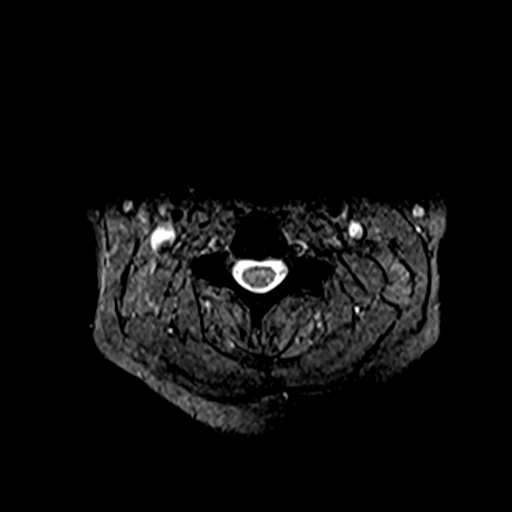

[38 of 48 positions shown; findings below may reference images not displayed]

FINDINGS: Alignment: Physiologic.

Vertebrae: No fracture, evidence of discitis, or bone lesion.

Cord: Normal signal and morphology.

Posterior Fossa, vertebral arteries, paraspinal tissues: Posterior
fossa demonstrates no focal abnormality. Vertebral artery flow voids
are maintained. Paraspinal soft tissues are unremarkable.

Disc levels:

Discs: Degenerative disc disease with disc height loss at C4-5,
C5-6, and C6-7.

C2-3: No significant disc bulge. No neural foraminal stenosis. No
central canal stenosis. Mild bilateral facet arthropathy.

C3-4: No significant disc bulge. Mild right foraminal narrowing. No
left foraminal narrowing. No central canal stenosis.

C4-5: Broad-based disc bulge. Bilateral uncovertebral degenerative
changes. Moderate bilateral foraminal stenosis. Mild spinal
stenosis.

C5-6: Broad-based disc bulge. Severe right foraminal stenosis. No
left foraminal stenosis. No central canal stenosis.

C6-7: Broad-based disc bulge. Bilateral uncovertebral degenerative
changes. Moderate-severe right foraminal stenosis. Mild left
foraminal stenosis. No central canal stenosis.

C7-T1: No significant disc bulge. No neural foraminal stenosis. No
central canal stenosis. Moderate left foraminal stenosis.
IMPRESSION: 1. Cervical spine spondylosis as described above. No significant
interval change compared with 12/18/2013.
2.  No acute osseous injury of the cervical spine.

## 2019-12-10 DIAGNOSIS — E785 Hyperlipidemia, unspecified: Secondary | ICD-10-CM | POA: Diagnosis not present

## 2019-12-10 DIAGNOSIS — K219 Gastro-esophageal reflux disease without esophagitis: Secondary | ICD-10-CM | POA: Diagnosis not present

## 2019-12-10 DIAGNOSIS — K5792 Diverticulitis of intestine, part unspecified, without perforation or abscess without bleeding: Secondary | ICD-10-CM | POA: Diagnosis not present

## 2019-12-10 DIAGNOSIS — M792 Neuralgia and neuritis, unspecified: Secondary | ICD-10-CM | POA: Diagnosis not present

## 2019-12-10 DIAGNOSIS — I10 Essential (primary) hypertension: Secondary | ICD-10-CM | POA: Diagnosis not present

## 2019-12-10 DIAGNOSIS — R32 Unspecified urinary incontinence: Secondary | ICD-10-CM | POA: Diagnosis not present

## 2019-12-10 DIAGNOSIS — G8929 Other chronic pain: Secondary | ICD-10-CM | POA: Diagnosis not present

## 2019-12-10 DIAGNOSIS — Z7722 Contact with and (suspected) exposure to environmental tobacco smoke (acute) (chronic): Secondary | ICD-10-CM | POA: Diagnosis not present

## 2019-12-10 DIAGNOSIS — Z7982 Long term (current) use of aspirin: Secondary | ICD-10-CM | POA: Diagnosis not present

## 2019-12-10 DIAGNOSIS — N952 Postmenopausal atrophic vaginitis: Secondary | ICD-10-CM | POA: Diagnosis not present

## 2019-12-23 ENCOUNTER — Other Ambulatory Visit: Payer: Self-pay | Admitting: Family Medicine

## 2019-12-23 DIAGNOSIS — Z1231 Encounter for screening mammogram for malignant neoplasm of breast: Secondary | ICD-10-CM

## 2019-12-29 DIAGNOSIS — H04123 Dry eye syndrome of bilateral lacrimal glands: Secondary | ICD-10-CM | POA: Diagnosis not present

## 2019-12-31 DIAGNOSIS — H43811 Vitreous degeneration, right eye: Secondary | ICD-10-CM | POA: Diagnosis not present

## 2020-01-05 ENCOUNTER — Other Ambulatory Visit: Payer: Self-pay

## 2020-01-05 ENCOUNTER — Ambulatory Visit
Admission: RE | Admit: 2020-01-05 | Discharge: 2020-01-05 | Disposition: A | Discharge status: Home / Self Care | Payer: Medicare HMO | Source: Ambulatory Visit | Attending: Family Medicine | Admitting: Family Medicine

## 2020-01-05 DIAGNOSIS — Z1231 Encounter for screening mammogram for malignant neoplasm of breast: Secondary | ICD-10-CM

## 2020-01-31 ENCOUNTER — Ambulatory Visit (INDEPENDENT_AMBULATORY_CARE_PROVIDER_SITE_OTHER): Payer: Medicare HMO | Admitting: Family Medicine

## 2020-01-31 ENCOUNTER — Other Ambulatory Visit: Payer: Self-pay

## 2020-01-31 ENCOUNTER — Encounter: Payer: Self-pay | Admitting: Family Medicine

## 2020-01-31 VITALS — BP 138/80 | HR 72 | Ht 61.0 in | Wt 148.0 lb

## 2020-01-31 DIAGNOSIS — E782 Mixed hyperlipidemia: Secondary | ICD-10-CM

## 2020-01-31 DIAGNOSIS — L2084 Intrinsic (allergic) eczema: Secondary | ICD-10-CM

## 2020-01-31 DIAGNOSIS — I10 Essential (primary) hypertension: Secondary | ICD-10-CM | POA: Diagnosis not present

## 2020-01-31 DIAGNOSIS — K219 Gastro-esophageal reflux disease without esophagitis: Secondary | ICD-10-CM | POA: Diagnosis not present

## 2020-01-31 MED ORDER — TRIAMCINOLONE ACETONIDE 0.1 % EX CREA: 1.0000 "application " | TOPICAL_CREAM | Freq: Two times a day (BID) | CUTANEOUS | 2 refills | Status: DC

## 2020-01-31 MED ORDER — ATORVASTATIN CALCIUM 10 MG PO TABS: 10.0000 mg | ORAL_TABLET | Freq: Every day | ORAL | 1 refills | Status: DC

## 2020-01-31 MED ORDER — LOSARTAN POTASSIUM-HCTZ 50-12.5 MG PO TABS: 1.0000 | ORAL_TABLET | Freq: Every day | ORAL | 1 refills | Status: DC

## 2020-01-31 MED ORDER — PANTOPRAZOLE SODIUM 40 MG PO TBEC: 40.0000 mg | DELAYED_RELEASE_TABLET | Freq: Every day | ORAL | 1 refills | Status: DC

## 2020-01-31 NOTE — Progress Notes (Signed)
Date:  01/31/2020   Name:  Emily Boyer   DOB:  09/15/1945   MRN:  [id number]   Chief Complaint: Gastroesophageal Reflux, Hyperlipidemia, Hypertension, and Rash (Discoloration of skin on L) leg- itches)  Gastroesophageal Reflux She reports no abdominal pain, no chest pain, no choking, no coughing, no dysphagia, no heartburn, no hoarse voice, no nausea, no sore throat, no stridor or no wheezing. This is a chronic problem. The current episode started more than 1 year ago. The problem has been gradually improving. The symptoms are aggravated by certain foods. Pertinent negatives include no anemia, fatigue, melena, muscle weakness or orthopnea. She has tried a PPI for the symptoms. The treatment provided moderate relief.  Hyperlipidemia This is a chronic problem. The current episode started more than 1 year ago. The problem is controlled. Recent lipid tests were reviewed and are normal. She has no history of chronic renal disease, diabetes, hypothyroidism, obesity or nephrotic syndrome. Pertinent negatives include no chest pain, focal sensory loss, focal weakness, leg pain, myalgias or shortness of breath. Current antihyperlipidemic treatment includes statins (omega 3). The current treatment provides mild improvement of lipids. There are no compliance problems.  Risk factors for coronary artery disease include dyslipidemia and hypertension.  Hypertension This is a chronic problem. The current episode started more than 1 year ago. Pertinent negatives include no anxiety, blurred vision, chest pain, headaches, orthopnea, palpitations, peripheral edema, PND or shortness of breath. There are no associated agents to hypertension. Risk factors for coronary artery disease include dyslipidemia. Past treatments include diuretics and angiotensin blockers. The current treatment provides moderate improvement. There are no compliance problems.  There is no history of angina, kidney disease, CAD/MI, CVA, heart  failure, left ventricular hypertrophy, PVD or retinopathy. There is no history of chronic renal disease, a hypertension causing med or renovascular disease.  Rash This is a new problem. The current episode started more than 1 month ago. The problem has been waxing and waning since onset. The affected locations include the left ankle. The rash is characterized by itchiness. She was exposed to nothing. Pertinent negatives include no congestion, cough, diarrhea, eye pain, fatigue, fever, rhinorrhea, shortness of breath, sore throat or vomiting.    Lab Results  Component Value Date   CREATININE 1.09 (H) 08/20/2019   BUN 24 08/20/2019   NA 139 08/20/2019   K 4.7 08/20/2019   CL 98 08/20/2019   CO2 26 08/20/2019   Lab Results  Component Value Date   CHOL 249 (H) 08/20/2019   HDL 61 08/20/2019   LDLCALC 158 (H) 08/20/2019   TRIG 169 (H) 08/20/2019   CHOLHDL 5.3 (H) 12/04/2016   Lab Results  Component Value Date   TSH 1.240 08/20/2019   No results found for: HGBA1C Lab Results  Component Value Date   WBC 11.0 (H) 01/12/2019   HGB 13.9 01/12/2019   HCT 41.3 01/12/2019   MCV 88 01/12/2019   PLT 335 01/12/2019   Lab Results  Component Value Date   ALT 17 08/20/2019   AST 19 08/20/2019   ALKPHOS 80 08/20/2019   BILITOT 0.3 08/20/2019     Review of Systems  Constitutional: Negative.  Negative for chills, fatigue, fever and unexpected weight change.  HENT: Negative for congestion, ear discharge, ear pain, hoarse voice, rhinorrhea, sinus pressure, sneezing and sore throat.   Eyes: Negative for blurred vision, double vision, photophobia, pain, discharge, redness and itching.  Respiratory: Negative for cough, choking, shortness of breath, wheezing and stridor.  Cardiovascular: Negative for chest pain, palpitations, orthopnea and PND.  Gastrointestinal: Negative for abdominal pain, blood in stool, constipation, diarrhea, dysphagia, heartburn, melena, nausea and vomiting.  Endocrine:  Negative for cold intolerance, heat intolerance, polydipsia, polyphagia and polyuria.  Genitourinary: Negative for dysuria, flank pain, frequency, hematuria, menstrual problem, pelvic pain, urgency, vaginal bleeding and vaginal discharge.  Musculoskeletal: Negative for arthralgias, back pain, myalgias and muscle weakness.  Skin: Positive for rash.  Allergic/Immunologic: Negative for environmental allergies and food allergies.  Neurological: Negative for dizziness, focal weakness, weakness, light-headedness, numbness and headaches.  Hematological: Negative for adenopathy. Does not bruise/bleed easily.  Psychiatric/Behavioral: Negative for dysphoric mood. The patient is not nervous/anxious.     Patient Active Problem List   Diagnosis Date Noted  . DDD (degenerative disc disease), cervical 05/28/2018  . Sebaceous cyst 12/20/2015  . Abnormal findings-gastrointestinal tract   . Encounter for general adult medical examination without abnormal findings 07/04/2014  . Nerve root pain 07/04/2014  . Gastroesophageal reflux disease 07/04/2014  . HLD (hyperlipidemia) 07/04/2014  . Hypertension 07/04/2014    Allergies  Allergen Reactions  . Guaifenesin & Derivatives   . Methocarbamol Itching  . Penicillins Itching  . Tramadol Hcl Itching    Past Surgical History:  Procedure Laterality Date  . BREAST BIOPSY Right 2004   neg  . CATARACT EXTRACTION W/PHACO Right 04/29/2017   Procedure: CATARACT EXTRACTION PHACO AND INTRAOCULAR LENS PLACEMENT ([facility]) RIGHT;  Surgeon: Eulogio Bear, MD;  Location: [facility];  Service: Ophthalmology;  Laterality: Right;  . CATARACT EXTRACTION W/PHACO Left 07/15/2017   Procedure: CATARACT EXTRACTION PHACO AND INTRAOCULAR LENS PLACEMENT ([place]) LEFT;  Surgeon: Eulogio Bear, MD;  Location: [facility];  Service: Ophthalmology;  Laterality: Left;  . COLONOSCOPY WITH PROPOFOL N/A 02/06/2015   Procedure: COLONOSCOPY WITH PROPOFOL;  Surgeon: [name], MD;  Location: [facility];  Service: Endoscopy;  Laterality: N/A;  . EYE SURGERY  2019  . TUBAL LIGATION  1980s  . VAGINAL HYSTERECTOMY  1990s   one ovary removed as well    Social History   Tobacco Use  . Smoking status: Former Smoker    Packs/day: 0.50    Years: 50.00    Pack years: 25.00    Types: Cigarettes, E-cigarettes    Quit date: 11/22/2011    Years since quitting: 8.1  . Smokeless tobacco: Never Used  . Tobacco comment: quit 2014  Vaping Use  . Vaping Use: Never used  Substance Use Topics  . Alcohol use: Yes    Alcohol/week: 2.0 standard drinks    Types: 2 Glasses of wine per week  . Drug use: No     Medication list has been reviewed and updated.  Current Meds  Medication Sig  . aspirin EC 81 MG tablet Take by mouth.  Marland Kitchen atorvastatin (LIPITOR) 10 MG tablet Take 1 tablet (10 mg total) by mouth daily.  . cholecalciferol (VITAMIN D) 1000 units tablet Take 1,000 Units by mouth daily.  Marland Kitchen losartan-hydrochlorothiazide (HYZAAR) 50-12.5 MG tablet Take 1 tablet by mouth daily.  . Multiple Vitamins-Minerals (CENTRUM SILVER 50+WOMEN) TABS Take by mouth.  . Omega 3 1000 MG CAPS Take 1 capsule by mouth daily.  . pantoprazole (PROTONIX) 40 MG tablet Take 1 tablet (40 mg total) by mouth daily.  . Zinc Oxide-Vitamin C (ZINC PLUS VITAMIN C PO) Take 1 tablet by mouth daily.    PHQ 2/9 Scores 01/31/2020 11/30/2019 08/20/2019 06/11/2019  PHQ - 2 Score 0 0 0 0  PHQ- 9 Score 0 0 0 0    GAD 7 : Generalized Anxiety Score 11/30/2019 08/20/2019 06/11/2019 05/31/2019  Nervous, Anxious, on Edge 0 0 0 0  Control/stop worrying 0 0 0 0  Worry too much - different things 0 0 0 0  Trouble relaxing 0 0 0 0  Restless 0 0 0 0  Easily annoyed or irritable 0 0 0 0  Afraid - awful might happen 0 0 0 0  Total GAD 7 Score 0 0 0 0  Anxiety Difficulty - - Not difficult at all -    BP Readings from Last 3 Encounters:  01/31/20 138/80  11/30/19 138/80  08/20/19 (!) 120/64     Physical Exam Vitals and nursing note reviewed.  Constitutional:      Appearance: She is well-developed and well-nourished.  HENT:     Head: Normocephalic.     Right Ear: Tympanic membrane, ear canal and external ear normal.     Left Ear: Tympanic membrane, ear canal and external ear normal.     Nose: Nose normal.     Mouth/Throat:     Mouth: Oropharynx is clear and moist. Mucous membranes are moist.  Eyes:     General: Lids are everted, no foreign bodies appreciated. No scleral icterus.       Left eye: No foreign body or hordeolum.     Extraocular Movements: EOM normal.     Conjunctiva/sclera: Conjunctivae normal.     Right eye: Right conjunctiva is not injected.     Left eye: Left conjunctiva is not injected.     Pupils: Pupils are equal, round, and reactive to light.  Neck:     Thyroid: No thyromegaly.     Vascular: No JVD.     Trachea: No tracheal deviation.  Cardiovascular:     Rate and Rhythm: Normal rate and regular rhythm.     Pulses: Intact distal pulses.     Heart sounds: Normal heart sounds. No murmur heard. No friction rub. No gallop.   Pulmonary:     Effort: Pulmonary effort is normal. No respiratory distress.     Breath sounds: Normal breath sounds. No wheezing, rhonchi or rales.  Chest:     Chest wall: No tenderness.  Abdominal:     General: Bowel sounds are normal.     Palpations: Abdomen is soft. There is no hepatosplenomegaly or mass.     Tenderness: There is no abdominal tenderness. There is no guarding or rebound.  Musculoskeletal:        General: No tenderness or edema. Normal range of motion.     Cervical back: Normal range of motion and neck supple.  Lymphadenopathy:     Cervical: No cervical adenopathy.  Skin:    General: Skin is warm.     Capillary Refill: Capillary refill takes less than 2 seconds.     Findings: No rash.     Comments: hyperpigmentation  Neurological:     Mental Status: She is alert and oriented to person, place, and  time.     Cranial Nerves: No cranial nerve deficit.     Deep Tendon Reflexes: Strength normal. Reflexes normal.  Psychiatric:        Mood and Affect: Mood and affect normal. Mood is not anxious or depressed.     Wt Readings from Last 3 Encounters:  01/31/20 148 lb (67.1 kg)  11/30/19 147 lb (66.7 kg)  08/20/19 149 lb (67.6 kg)    BP 138/80   Pulse  72   Ht 5\' 1"  (1.549 m)   Wt 148 lb (67.1 kg)   BMI 27.96 kg/m   Assessment and Plan:   1. Essential hypertension Chronic.  Controlled.  Stable.  Blood pressure today is 138/80.  Continue losartan hydrochlorothiazide 50-12.5 mg once a day.  Will check renal function panel for GFR and electrolyte status. - losartan-hydrochlorothiazide (HYZAAR) 50-12.5 MG tablet; Take 1 tablet by mouth daily.  Dispense: 90 tablet; Refill: 1 - Renal Function Panel  2. Mixed hyperlipidemia Chronic.  Controlled.  Stable.  Continue atorvastatin 10 mg once a day.  Will check lipid panel for current status of ratio and LDL. - atorvastatin (LIPITOR) 10 MG tablet; Take 1 tablet (10 mg total) by mouth daily.  Dispense: 180 tablet; Refill: 1 - Lipid Panel With LDL/HDL Ratio  3. Gastroesophageal reflux disease, unspecified whether esophagitis present Chronic.  Controlled.  Stable.  Continue pantoprazole 40 mg once a day. - pantoprazole (PROTONIX) 40 MG tablet; Take 1 tablet (40 mg total) by mouth daily.  Dispense: 180 tablet; Refill: 1  4. Intrinsic eczema New onset.  Persistent.  Episodic.  Area on the lateral aspect of left ankle that is hyperpigmented secondary to lichenification.  Will initiate triamcinolone 0.1% apply twice a day as needed. - triamcinolone (KENALOG) 0.1 %; Apply 1 application topically 2 (two) times daily.  Dispense: 30 g; Refill: 2

## 2020-02-01 DIAGNOSIS — E782 Mixed hyperlipidemia: Secondary | ICD-10-CM | POA: Diagnosis not present

## 2020-02-01 DIAGNOSIS — I10 Essential (primary) hypertension: Secondary | ICD-10-CM | POA: Diagnosis not present

## 2020-02-02 LAB — RENAL FUNCTION PANEL
Albumin: 4.3 g/dL (ref 3.7–4.7)
BUN/Creatinine Ratio: 17 (ref 12–28)
BUN: 16 mg/dL (ref 8–27)
CO2: 26 mmol/L (ref 20–29)
Calcium: 9.5 mg/dL (ref 8.7–10.3)
Chloride: 98 mmol/L (ref 96–106)
Creatinine, Ser: 0.95 mg/dL (ref 0.57–1.00)
GFR calc Af Amer: 69 mL/min/{1.73_m2} (ref >59)
GFR calc non Af Amer: 60 mL/min/{1.73_m2} (ref >59)
Glucose: 92 mg/dL (ref 65–99)
Phosphorus: 3.7 mg/dL (ref 3.0–4.3)
Potassium: 4.4 mmol/L (ref 3.5–5.2)
Sodium: 141 mmol/L (ref 134–144)

## 2020-02-02 LAB — LIPID PANEL WITH LDL/HDL RATIO
Cholesterol, Total: 193 mg/dL (ref 100–199)
HDL: 40 mg/dL (ref >39)
LDL Chol Calc (NIH): 115 mg/dL — ABNORMAL HIGH (ref 0–99)
LDL/HDL Ratio: 2.9 ratio (ref 0.0–3.2)
Triglycerides: 214 mg/dL — ABNORMAL HIGH (ref 0–149)
VLDL Cholesterol Cal: 38 mg/dL (ref 5–40)

## 2020-02-05 ENCOUNTER — Encounter: Payer: Self-pay | Admitting: Family Medicine

## 2020-02-09 ENCOUNTER — Ambulatory Visit (INDEPENDENT_AMBULATORY_CARE_PROVIDER_SITE_OTHER): Payer: Medicare HMO

## 2020-02-09 ENCOUNTER — Telehealth: Payer: Self-pay

## 2020-02-09 DIAGNOSIS — Z0000 Encounter for general adult medical examination without abnormal findings: Secondary | ICD-10-CM

## 2020-02-09 NOTE — Progress Notes (Signed)
Subjective:   Emily Boyer is a 75 y.o. female who presents for Medicare Annual (Subsequent) preventive examination.  Virtual Visit via Telephone Note  I connected with  Emily Boyer on 02/09/20 at 11:20 am by telephone and verified that I am speaking with the correct person using two identifiers.  Location: Patient: home  Provider: [facility] Persons participating in the virtual visit: [place]   I discussed the limitations, risks, security and privacy concerns of performing an evaluation and management service by telephone and the availability of in person appointments. The patient expressed understanding and agreed to proceed.  Interactive audio and video telecommunications were attempted between this nurse and patient, however failed, due to patient having technical difficulties OR patient did not have access to video capability.  We continued and completed visit with audio only.  Some vital signs may be absent or patient reported.   Emily Marker, LPN    Review of Systems     Cardiac Risk Factors include: advanced age (>60men, >43 women);hypertension;dyslipidemia     Objective:    Today's Vitals   02/09/20 1127  PainSc: 5    There is no height or weight on file to calculate BMI.  Advanced Directives 02/09/2020 02/08/2019 01/29/2018 01/09/2018 07/15/2017 04/29/2017 02/06/2015  Does Patient Have a Medical Advance Directive? No No No No No No No  Would patient like information on creating a medical advance directive? No - Patient declined Yes (MAU/Ambulatory/Procedural Areas - Information given) - - Yes (MAU/Ambulatory/Procedural Areas - Information given) No - Patient declined No - patient declined information    Current Medications (verified) Outpatient Encounter Medications as of 02/09/2020  Medication Sig  . acetaminophen (TYLENOL) 500 MG tablet Take 500 mg by mouth every 6 (six) hours as needed.  Marland Kitchen aspirin EC 81 MG tablet Take by mouth.  Marland Kitchen  atorvastatin (LIPITOR) 10 MG tablet Take 1 tablet (10 mg total) by mouth daily.  . cholecalciferol (VITAMIN D) 1000 units tablet Take 1,000 Units by mouth daily.  Marland Kitchen losartan-hydrochlorothiazide (HYZAAR) 50-12.5 MG tablet Take 1 tablet by mouth daily.  . Multiple Vitamins-Minerals (CENTRUM SILVER 50+WOMEN) TABS Take by mouth.  . Omega 3 1000 MG CAPS Take 1 capsule by mouth daily.  . pantoprazole (PROTONIX) 40 MG tablet Take 1 tablet (40 mg total) by mouth daily.  Marland Kitchen triamcinolone (KENALOG) 0.1 % Apply 1 application topically 2 (two) times daily.  . Zinc Oxide-Vitamin C (ZINC PLUS VITAMIN C PO) Take 1 tablet by mouth daily.   No facility-administered encounter medications on file as of 02/09/2020.    Allergies (verified) Guaifenesin & derivatives, Methocarbamol, Penicillins, and Tramadol hcl   History: Past Medical History:  Diagnosis Date  . Allergy Sinus  . Bone spur    right side of neck  . Colitis   . DDD (degenerative disc disease), cervical   . Diverticulitis   . GERD (gastroesophageal reflux disease)   . Heart murmur    followed by PCP  . HLD (hyperlipidemia)   . Hypertension   . Seasonal allergies    Past Surgical History:  Procedure Laterality Date  . BREAST BIOPSY Right 2004   neg  . CATARACT EXTRACTION W/PHACO Right 04/29/2017   Procedure: CATARACT EXTRACTION PHACO AND INTRAOCULAR LENS PLACEMENT (I[facility]) RIGHT;  Surgeon: Eulogio Bear, MD;  Location: [place];  Service: Ophthalmology;  Laterality: Right;  . CATARACT EXTRACTION W/PHACO Left 07/15/2017   Procedure: CATARACT EXTRACTION PHACO AND INTRAOCULAR LENS PLACEMENT ([place]) LEFT;  Surgeon: Edison Pace,  Josie Saunders, MD;  Location: [place];  Service: Ophthalmology;  Laterality: Left;  . COLONOSCOPY WITH PROPOFOL N/A 02/06/2015   Procedure: COLONOSCOPY WITH PROPOFOL;  Surgeon: [name], MD;  Location: [place];  Service: Endoscopy;  Laterality: N/A;  . EYE SURGERY  2019  . TUBAL LIGATION   1980s  . VAGINAL HYSTERECTOMY  1990s   one ovary removed as well   Family History  Problem Relation Age of Onset  . Stroke Mother   . Hypertension Mother   . Arthritis Father   . Heart disease Paternal Aunt   . Cancer Paternal Grandmother   . Stomach cancer Paternal Grandmother   . Breast cancer Cousin        pat cousin   Social History   Socioeconomic History  . Marital status: Divorced    Spouse name: Not on file  . Number of children: 2  . Years of education: Not on file  . Highest education level: Not on file  Occupational History  . Not on file  Tobacco Use  . Smoking status: Former Smoker    Packs/day: 0.50    Years: 50.00    Pack years: 25.00    Types: Cigarettes, E-cigarettes    Quit date: 11/22/2011    Years since quitting: 8.2  . Smokeless tobacco: Never Used  . Tobacco comment: quit 2014  Vaping Use  . Vaping Use: Never used  Substance and Sexual Activity  . Alcohol use: Yes    Alcohol/week: 2.0 standard drinks    Types: 2 Glasses of wine per week  . Drug use: No  . Sexual activity: Yes    Birth control/protection: None  Other Topics Concern  . Not on file  Social History Narrative  . Not on file   Social Determinants of Health   Financial Resource Strain: Low Risk   . Difficulty of Paying Living Expenses: Not hard at all  Food Insecurity: No Food Insecurity  . Worried About Charity fundraiser in the Last Year: Never true  . Ran Out of Food in the Last Year: Never true  Transportation Needs: No Transportation Needs  . Lack of Transportation (Medical): No  . Lack of Transportation (Non-Medical): No  Physical Activity: Inactive  . Days of Exercise per Week: 0 days  . Minutes of Exercise per Session: 0 min  Stress: No Stress Concern Present  . Feeling of Stress : Not at all  Social Connections: Moderately Isolated  . Frequency of Communication with Friends and Family: More than three times a week  . Frequency of Social Gatherings with Friends  and Family: Once a week  . Attends Religious Services: Never  . Active Member of Clubs or Organizations: Yes  . Attends Archivist Meetings: More than 4 times per year  . Marital Status: Divorced    Tobacco Counseling Counseling given: Not Answered Comment: quit 2014   Clinical Intake:  Pre-visit preparation completed: Yes  Pain : 0-10 Pain Score: 5  Pain Type: Chronic pain Pain Location: Neck (right shoulder and arm) Pain Orientation: Right Pain Descriptors / Indicators: Aching Pain Onset: More than a month ago Pain Frequency: Constant     Nutritional Risks: None Diabetes: No  How often do you need to have someone help you when you read instructions, pamphlets, or other written materials from your doctor or pharmacy?: 1 - Never    Interpreter Needed?: No  Information entered by :: Emily Marker LPN   Activities of Daily  Living In your present state of health, do you have any difficulty performing the following activities: 02/09/2020  Hearing? N  Comment declines hearing aids  Vision? N  Difficulty concentrating or making decisions? N  Walking or climbing stairs? N  Dressing or bathing? N  Doing errands, shopping? N  Preparing Food and eating ? N  Using the Toilet? N  In the past six months, have you accidently leaked urine? Y  Do you have problems with loss of bowel control? N  Managing your Medications? N  Managing your Finances? N  Housekeeping or managing your Housekeeping? N  Some recent data might be hidden    Patient Care Team: Juline Patch, MD as PCP - General (Family Medicine)  Indicate any recent Medical Services you may have received from other than [facility] providers in the past year (date may be approximate).     Assessment:   This is a routine wellness examination for Vika.  Hearing/Vision screen  Hearing Screening   125Hz  250Hz  500Hz  1000Hz  2000Hz  3000Hz  4000Hz  6000Hz  8000Hz   Right ear:           Left ear:            Comments: Pt denies hearing difficulty  Vision Screening Comments: Annual vision screenings done by Dr. Mallie Mussel; but transferring to Dr. Edison Pace since he did cataract surgery  Dietary issues and exercise activities discussed: Current Exercise Habits: The patient does not participate in regular exercise at present, Exercise limited by: orthopedic condition(s)  Goals    . Increase physical activity     Recommend physical activity of 150 minutes per day       Depression Screen PHQ 2/9 Scores 02/09/2020 01/31/2020 11/30/2019 08/20/2019 06/11/2019 05/31/2019 02/08/2019  PHQ - 2 Score 0 0 0 0 0 0 0  PHQ- 9 Score - 0 0 0 0 0 -    Fall Risk Fall Risk  02/09/2020 08/20/2019 06/11/2019 05/31/2019 02/08/2019  Falls in the past year? 0 0 0 0 0  Number falls in past yr: 0 - 0 - 0  Injury with Fall? 0 - 0 - 0  Risk for fall due to : No Fall Risks - No Fall Risks - No Fall Risks  Follow up Falls prevention discussed Falls evaluation completed Falls evaluation completed Falls evaluation completed Falls prevention discussed    FALL RISK PREVENTION PERTAINING TO THE HOME:  Any stairs in or around the home? Yes  If so, are there any without handrails? No  Home free of loose throw rugs in walkways, pet beds, electrical cords, etc? Yes  Adequate lighting in your home to reduce risk of falls? Yes   ASSISTIVE DEVICES UTILIZED TO PREVENT FALLS:  Life alert? No  Use of a cane, walker or w/c? No  Grab bars in the bathroom? No  Shower chair or bench in shower? No  Elevated toilet seat or a handicapped toilet? No   TIMED UP AND GO:  Was the test performed? No . Telephonic visit.   Cognitive Function: Normal cognitive status assessed by direct observation by this Nurse Health Advisor. No abnormalities found.       6CIT Screen 02/08/2019  What Year? 0 points  What month? 0 points  What time? 0 points  Count back from 20 0 points  Months in reverse 0 points  Repeat phrase 2 points  Total Score 2     Immunizations Immunization History  Administered Date(s) Administered  . Fluad Quad(high Dose 65+) 10/12/2018, 11/03/2019  .  Influenza, High Dose Seasonal PF 11/18/2016, 11/25/2017  . Influenza,inj,Quad PF,6+ Mos 12/07/2015  . Moderna Sars-Covid-2 Vaccination 02/26/2019, 03/26/2019, 11/27/2019  . PPD Test 01/17/2016  . Pneumococcal Conjugate-13 12/13/2015  . Pneumococcal Polysaccharide-23 07/05/2014  . Tdap 12/07/2015    TDAP status: Up to date  Flu Vaccine status: Up to date  Pneumococcal vaccine status: Up to date  Covid-19 vaccine status: Completed vaccines  Qualifies for Shingles Vaccine? Yes   Zostavax completed No   Shingrix Completed?: No.    Education has been provided regarding the importance of this vaccine. Patient has been advised to call insurance company to determine out of pocket expense if they have not yet received this vaccine. Advised may also receive vaccine at local pharmacy or Health Dept. Verbalized acceptance and understanding.  Screening Tests Health Maintenance  Topic Date Due  . Hepatitis C Screening  01/30/2021 (Originally 1946-01-10)  . MAMMOGRAM  01/04/2022  . COLONOSCOPY (Pts 45-28yrs Insurance coverage will need to be confirmed)  02/05/2025  . TETANUS/TDAP  12/06/2025  . INFLUENZA VACCINE  Completed  . DEXA SCAN  Completed  . COVID-19 Vaccine  Completed  . PNA vac Low Risk Adult  Completed    Health Maintenance  There are no preventive care reminders to display for this patient.  Colorectal cancer screening: Type of screening: Colonoscopy. Completed 02/06/15. Repeat every 10 years  Mammogram status: Completed 01/05/20. Repeat every year  Bone Density status: Completed 01/01/18. Results reflect: Bone density results: OSTEOPENIA. Repeat every 2 years. Pt declines repeat screening at this time; wants to get with next mammogram.   Lung Cancer Screening: (Low Dose CT Chest recommended if Age 77-80 years, 30 pack-year currently smoking OR  have quit w/in 15years.) does not qualify.   Additional Screening:  Hepatitis C Screening: does qualify; postponed  Vision Screening: Recommended annual ophthalmology exams for early detection of glaucoma and other disorders of the eye. Is the patient up to date with their annual eye exam?  Yes  Who is the provider or what is the name of the office in which the patient attends annual eye exams? Dr. Mallie Mussel and Dr. Edison Pace  Dental Screening: Recommended annual dental exams for proper oral hygiene  Community Resource Referral / Chronic Care Management: CRR required this visit?  No   CCM required this visit?  No      Plan:     I have personally reviewed and noted the following in the patient's chart:   . Medical and social history . Use of alcohol, tobacco or illicit drugs  . Current medications and supplements . Functional ability and status . Nutritional status . Physical activity . Advanced directives . List of other physicians . Hospitalizations, surgeries, and ER visits in previous 12 months . Vitals . Screenings to include cognitive, depression, and falls . Referrals and appointments  In addition, I have reviewed and discussed with patient certain preventive protocols, quality metrics, and best practice recommendations. A written personalized care plan for preventive services as well as general preventive health recommendations were provided to patient.     Emily Marker, LPN   1/61/0960   Nurse Notes: none

## 2020-02-09 NOTE — Patient Instructions (Signed)
[name] , Thank you for taking time to come for your Medicare Wellness Visit. I appreciate your ongoing commitment to your health goals. Please review the following plan we discussed and let me know if I can assist you in the future.   Screening recommendations/referrals: Colonoscopy: done 02/06/15 Mammogram: done 01/05/20 Bone Density: done 01/01/18 Recommended yearly ophthalmology/optometry visit for glaucoma screening and checkup Recommended yearly dental visit for hygiene and checkup  Vaccinations: Influenza vaccine: done 11/03/19 Pneumococcal vaccine: done 12/13/15 Tdap vaccine: done 12/07/15 Shingles vaccine: Shingrix discussed. Please contact your pharmacy for coverage information.  Covid-19: done 02/26/19, 03/26/19 & 11/27/19  Advanced directives: Advance directive discussed with you today. Even though you declined this today please call our office should you change your mind and we can give you the proper paperwork for you to fill out.  Conditions/risks identified: Recommend increasing physical activity as tolerated  Next appointment: Follow up in one year for your annual wellness visit    Preventive Care 65 Years and Older, Female Preventive care refers to lifestyle choices and visits with your health care provider that can promote health and wellness. What does preventive care include?  A yearly physical exam. This is also called an annual well check.  Dental exams once or twice a year.  Routine eye exams. Ask your health care provider how often you should have your eyes checked.  Personal lifestyle choices, including:  Daily care of your teeth and gums.  Regular physical activity.  Eating a healthy diet.  Avoiding tobacco and drug use.  Limiting alcohol use.  Practicing safe sex.  Taking low-dose aspirin every day.  Taking vitamin and mineral supplements as recommended by your health care provider. What happens during an annual well check? The services  and screenings done by your health care provider during your annual well check will depend on your age, overall health, lifestyle risk factors, and family history of disease. Counseling  Your health care provider may ask you questions about your:  Alcohol use.  Tobacco use.  Drug use.  Emotional well-being.  Home and relationship well-being.  Sexual activity.  Eating habits.  History of falls.  Memory and ability to understand (cognition).  Work and work Statistician.  Reproductive health. Screening  You may have the following tests or measurements:  Height, weight, and BMI.  Blood pressure.  Lipid and cholesterol levels. These may be checked every 5 years, or more frequently if you are over 72 years old.  Skin check.  Lung cancer screening. You may have this screening every year starting at age 43 if you have a 30-pack-year history of smoking and currently smoke or have quit within the past 15 years.  Fecal occult blood test (FOBT) of the stool. You may have this test every year starting at age 41.  Flexible sigmoidoscopy or colonoscopy. You may have a sigmoidoscopy every 5 years or a colonoscopy every 10 years starting at age 4.  Hepatitis C blood test.  Hepatitis B blood test.  Sexually transmitted disease (STD) testing.  Diabetes screening. This is done by checking your blood sugar (glucose) after you have not eaten for a while (fasting). You may have this done every 1-3 years.  Bone density scan. This is done to screen for osteoporosis. You may have this done starting at age 25.  Mammogram. This may be done every 1-2 years. Talk to your health care provider about how often you should have regular mammograms. Talk with your health care provider about your  test results, treatment options, and if necessary, the need for more tests. Vaccines  Your health care provider may recommend certain vaccines, such as:  Influenza vaccine. This is recommended every  year.  Tetanus, diphtheria, and acellular pertussis (Tdap, Td) vaccine. You may need a Td booster every 10 years.  Zoster vaccine. You may need this after age 69.  Pneumococcal 13-valent conjugate (PCV13) vaccine. One dose is recommended after age 17.  Pneumococcal polysaccharide (PPSV23) vaccine. One dose is recommended after age 37. Talk to your health care provider about which screenings and vaccines you need and how often you need them. This information is not intended to replace advice given to you by your health care provider. Make sure you discuss any questions you have with your health care provider. Document Released: 02/03/2015 Document Revised: 09/27/2015 Document Reviewed: 11/08/2014 Elsevier Interactive Patient Education  2017 [place] Prevention in the Home Falls can cause injuries. They can happen to people of all ages. There are many things you can do to make your home safe and to help prevent falls. What can I do on the outside of my home?  Regularly fix the edges of walkways and driveways and fix any cracks.  Remove anything that might make you trip as you walk through a door, such as a raised step or threshold.  Trim any bushes or trees on the path to your home.  Use bright outdoor lighting.  Clear any walking paths of anything that might make someone trip, such as rocks or tools.  Regularly check to see if handrails are loose or broken. Make sure that both sides of any steps have handrails.  Any raised decks and porches should have guardrails on the edges.  Have any leaves, snow, or ice cleared regularly.  Use sand or salt on walking paths during winter.  Clean up any spills in your garage right away. This includes oil or grease spills. What can I do in the bathroom?  Use night lights.  Install grab bars by the toilet and in the tub and shower. Do not use towel bars as grab bars.  Use non-skid mats or decals in the tub or shower.  If you  need to sit down in the shower, use a plastic, non-slip stool.  Keep the floor dry. Clean up any water that spills on the floor as soon as it happens.  Remove soap buildup in the tub or shower regularly.  Attach bath mats securely with double-sided non-slip rug tape.  Do not have throw rugs and other things on the floor that can make you trip. What can I do in the bedroom?  Use night lights.  Make sure that you have a light by your bed that is easy to reach.  Do not use any sheets or blankets that are too big for your bed. They should not hang down onto the floor.  Have a firm chair that has side arms. You can use this for support while you get dressed.  Do not have throw rugs and other things on the floor that can make you trip. What can I do in the kitchen?  Clean up any spills right away.  Avoid walking on wet floors.  Keep items that you use a lot in easy-to-reach places.  If you need to reach something above you, use a strong step stool that has a grab bar.  Keep electrical cords out of the way.  Do not use floor polish or wax that  makes floors slippery. If you must use wax, use non-skid floor wax.  Do not have throw rugs and other things on the floor that can make you trip. What can I do with my stairs?  Do not leave any items on the stairs.  Make sure that there are handrails on both sides of the stairs and use them. Fix handrails that are broken or loose. Make sure that handrails are as long as the stairways.  Check any carpeting to make sure that it is firmly attached to the stairs. Fix any carpet that is loose or worn.  Avoid having throw rugs at the top or bottom of the stairs. If you do have throw rugs, attach them to the floor with carpet tape.  Make sure that you have a light switch at the top of the stairs and the bottom of the stairs. If you do not have them, ask someone to add them for you. What else can I do to help prevent falls?  Wear shoes  that:  Do not have high heels.  Have rubber bottoms.  Are comfortable and fit you well.  Are closed at the toe. Do not wear sandals.  If you use a stepladder:  Make sure that it is fully opened. Do not climb a closed stepladder.  Make sure that both sides of the stepladder are locked into place.  Ask someone to hold it for you, if possible.  Clearly mark and make sure that you can see:  Any grab bars or handrails.  First and last steps.  Where the edge of each step is.  Use tools that help you move around (mobility aids) if they are needed. These include:  Canes.  Walkers.  Scooters.  Crutches.  Turn on the lights when you go into a dark area. Replace any light bulbs as soon as they burn out.  Set up your furniture so you have a clear path. Avoid moving your furniture around.  If any of your floors are uneven, fix them.  If there are any pets around you, be aware of where they are.  Review your medicines with your doctor. Some medicines can make you feel dizzy. This can increase your chance of falling. Ask your doctor what other things that you can do to help prevent falls. This information is not intended to replace advice given to you by your health care provider. Make sure you discuss any questions you have with your health care provider. Document Released: 11/03/2008 Document Revised: 06/15/2015 Document Reviewed: 02/11/2014 Elsevier Interactive Patient Education  2017 [employer].

## 2020-02-09 NOTE — Telephone Encounter (Unsigned)
Copied from [place] [phone]. Topic: General - Other >> Feb 09, 2020  8:36 AM Leward Quan A wrote: Reason for CRM: Patient called in to speak to [facility] asking for a call back to discuss her coming into the office for her visit Please call her at Ph# [phone]

## 2020-02-14 DIAGNOSIS — M5030 Other cervical disc degeneration, unspecified cervical region: Secondary | ICD-10-CM | POA: Diagnosis not present

## 2020-02-14 DIAGNOSIS — M5412 Radiculopathy, cervical region: Secondary | ICD-10-CM | POA: Diagnosis not present

## 2020-02-14 DIAGNOSIS — M4802 Spinal stenosis, cervical region: Secondary | ICD-10-CM | POA: Diagnosis not present

## 2020-03-03 ENCOUNTER — Other Ambulatory Visit: Payer: Self-pay

## 2020-03-03 ENCOUNTER — Encounter: Payer: Self-pay | Admitting: Family Medicine

## 2020-03-03 ENCOUNTER — Ambulatory Visit (INDEPENDENT_AMBULATORY_CARE_PROVIDER_SITE_OTHER): Payer: Medicare HMO | Admitting: Family Medicine

## 2020-03-03 VITALS — BP 130/80 | HR 60 | Ht 61.0 in | Wt 149.0 lb

## 2020-03-03 DIAGNOSIS — M519 Unspecified thoracic, thoracolumbar and lumbosacral intervertebral disc disorder: Secondary | ICD-10-CM | POA: Diagnosis not present

## 2020-03-03 DIAGNOSIS — Z90710 Acquired absence of both cervix and uterus: Secondary | ICD-10-CM | POA: Diagnosis not present

## 2020-03-03 MED ORDER — PREDNISONE 10 MG PO TABS
10.0000 mg | ORAL_TABLET | Freq: Every day | ORAL | 0 refills | Status: DC
Start: 2020-03-03 — End: 2020-04-10

## 2020-03-03 MED ORDER — MELOXICAM 7.5 MG PO TABS: 7.5000 mg | ORAL_TABLET | Freq: Every day | ORAL | 0 refills | Status: DC

## 2020-03-03 MED ORDER — TIZANIDINE HCL 4 MG PO CAPS: 4.0000 mg | ORAL_CAPSULE | Freq: Three times a day (TID) | ORAL | 1 refills | Status: DC

## 2020-03-03 NOTE — Patient Instructions (Signed)
Lumbar Sprain A lumbar sprain, which is sometimes called a low-back sprain, is a stretch or tear in the bands of tissue that hold bones and joints together (ligaments) in the lower back (lumbar spine). This type of injury occurs when you overextend or stretch these ligaments beyond their limits. Lumbar sprains can range from mild to severe. Mild sprains may involve stretching a ligament without tearing it. These may heal in 1-2 weeks. More severe sprains involve tearing of the ligament. These will cause more pain and may take 6-8 weeks to heal. What are the causes? This condition may be caused by:  Trauma, such as a fall or a hit to the body.  Twisting or overstretching the back. This may result from doing activities that need a lot of energy, such as lifting heavy objects. What increases the risk? This injury is more common in:  Athletes.  People with obesity.  People who do repeated lifting, bending, or other movements that involve their back. What are the signs or symptoms? Symptoms of this condition may include:  Sharp or dull pain in the lower back that does not go away. The pain may extend to the buttocks.  Stiffness or limited range of motion.  Sudden muscle tightening (spasms). How is this diagnosed? This condition may be diagnosed based on:  Your symptoms.  Your medical history.  A physical exam.  Imaging tests, such as: ? X-rays. ? MRI. How is this treated? Treatment for this condition may include:  Resting the injured area.  Applying heat and cold to the affected area.  Medicines for pain and inflammation, such as NSAIDs.  Prescription pain medicine and muscle relaxants may be needed for a short time.  Physical therapy. Follow these instructions at home: Managing pain, stiffness, and swelling  If directed, put ice on the injured area during the first 24 hours after your injury. ? Put ice in a plastic bag. ? Place a towel between your skin and the  bag. ? Leave the ice on for 20 minutes, 2-3 times a day.  If directed, apply heat to the affected area as often as told by your health care provider. Use the heat source that your health care provider recommends, such as a moist heat pack or a heating pad. ? Place a towel between your skin and the heat source. ? Leave the heat on for 20-30 minutes. ? Remove the heat if your skin turns bright red. This is especially important if you are unable to feel pain, heat, or cold. You may have a greater risk of getting burned.      Activity  Rest and return to your normal activities as told by your health care provider. Ask your health care provider what activities are safe for you.  Do exercises as told by your health care provider. Medicines  Take over-the-counter and prescription medicines only as told by your health care provider.  Ask your health care provider if the medicine prescribed to you: ? Requires you to avoid driving or using heavy machinery. ? Can cause constipation. You may need to take these actions to prevent or treat constipation:  Drink enough fluid to keep your urine pale yellow.  Take over-the-counter or prescription medicines.  Eat foods that are high in fiber, such as beans, whole grains, and fresh fruits and vegetables.  Limit foods that are high in fat and processed sugars, such as fried or sweet foods. Injury prevention To prevent a future low-back injury:  Always warm up   properly before physical activity or sports.  Cool down and stretch after being active.  Use correct form when playing sports and lifting heavy objects. Bend your knees before you lift heavy objects.  Use good posture when sitting and standing.  Stay physically fit and keep a healthy weight. ? Do at least 150 minutes of moderate-intensity exercise each week, such as brisk walking or water aerobics. ? Do strength exercises at least 2 times each week.   General instructions  Do not use any  products that contain nicotine or tobacco, such as cigarettes, e-cigarettes, and chewing tobacco. If you need help quitting, ask your health care provider.  Keep all follow-up visits as told by your health care provider. This is important. Contact a health care provider if:  Your back pain does not improve after 6 weeks of treatment.  Your symptoms get worse. Get help right away if:  Your back pain is severe.  You are unable to stand or walk.  You develop pain in your legs.  You develop weakness in your buttocks or legs.  You have difficulty controlling when you urinate or when you have a bowel movement. ? You have frequent, painful, or bloody urination. ? You have a temperature over 101.0F (38.3C). Summary  A lumbar sprain, which is sometimes called a low-back sprain, is a stretch or tear in the bands of tissue that hold bones and joints together (ligaments) in the lower back (lumbar spine).  Rest and return to your normal activities as told by your health care provider. Ask your health care provider what activities are safe for you.  If directed, apply heat and ice to the affected area as often as told by your health care provider.  Take over-the-counter and prescription medicines only as told by your health care provider.  Contact a health care provider if you have new or worsening symptoms. This information is not intended to replace advice given to you by your health care provider. Make sure you discuss any questions you have with your health care provider. Document Revised: 11/06/2017 Document Reviewed: 11/06/2017 Elsevier Patient Education  2021 [employer]  

## 2020-03-03 NOTE — Progress Notes (Signed)
Date:  03/03/2020   Name:  Emily Boyer   DOB:  04/25/1945   MRN:  [id number]   Chief Complaint: Back Pain (Lower back pain)  Back Pain This is a chronic problem. The current episode started in the past 7 days. The problem occurs constantly. The problem has been gradually worsening since onset. The pain is present in the lumbar spine. The quality of the pain is described as aching. The pain is moderate. The symptoms are aggravated by twisting. Associated symptoms include weakness. Pertinent negatives include no abdominal pain, bladder incontinence, bowel incontinence, chest pain, dysuria, fever, headaches, leg pain, numbness, paresthesias or pelvic pain. She has tried heat and NSAIDs for the symptoms. The treatment provided mild relief.    Lab Results  Component Value Date   CREATININE 0.95 02/01/2020   BUN 16 02/01/2020   NA 141 02/01/2020   K 4.4 02/01/2020   CL 98 02/01/2020   CO2 26 02/01/2020   Lab Results  Component Value Date   CHOL 193 02/01/2020   HDL 40 02/01/2020   LDLCALC 115 (H) 02/01/2020   TRIG 214 (H) 02/01/2020   CHOLHDL 5.3 (H) 12/04/2016   Lab Results  Component Value Date   TSH 1.240 08/20/2019   No results found for: HGBA1C Lab Results  Component Value Date   WBC 11.0 (H) 01/12/2019   HGB 13.9 01/12/2019   HCT 41.3 01/12/2019   MCV 88 01/12/2019   PLT 335 01/12/2019   Lab Results  Component Value Date   ALT 17 08/20/2019   AST 19 08/20/2019   ALKPHOS 80 08/20/2019   BILITOT 0.3 08/20/2019     Review of Systems  Constitutional: Negative.  Negative for chills, fatigue, fever and unexpected weight change.  HENT: Negative for congestion, ear discharge, ear pain, postnasal drip, rhinorrhea, sinus pressure, sneezing and sore throat.   Eyes: Negative for double vision, photophobia, pain, discharge, redness and itching.  Respiratory: Negative for cough, shortness of breath, wheezing and stridor.   Cardiovascular: Negative for chest pain,  palpitations and leg swelling.  Gastrointestinal: Negative for abdominal pain, blood in stool, bowel incontinence, constipation, diarrhea, nausea and vomiting.  Endocrine: Negative for cold intolerance, heat intolerance, polydipsia, polyphagia and polyuria.  Genitourinary: Negative for bladder incontinence, dysuria, flank pain, frequency, hematuria, menstrual problem, pelvic pain, urgency, vaginal bleeding and vaginal discharge.  Musculoskeletal: Positive for back pain. Negative for arthralgias and myalgias.  Skin: Negative for rash.  Allergic/Immunologic: Negative for environmental allergies and food allergies.  Neurological: Positive for weakness. Negative for dizziness, light-headedness, numbness, headaches and paresthesias.  Hematological: Negative for adenopathy. Does not bruise/bleed easily.  Psychiatric/Behavioral: Negative for dysphoric mood. The patient is not nervous/anxious.     Patient Active Problem List   Diagnosis Date Noted  . H/O total hysterectomy 03/03/2020  . DDD (degenerative disc disease), cervical 05/28/2018  . Sebaceous cyst 12/20/2015  . Abnormal findings-gastrointestinal tract   . Encounter for general adult medical examination without abnormal findings 07/04/2014  . Nerve root pain 07/04/2014  . Gastroesophageal reflux disease 07/04/2014  . HLD (hyperlipidemia) 07/04/2014  . Hypertension 07/04/2014    Allergies  Allergen Reactions  . Guaifenesin & Derivatives   . Methocarbamol Itching  . Penicillins Itching  . Tramadol Hcl Itching    Past Surgical History:  Procedure Laterality Date  . BREAST BIOPSY Right 2004   neg  . CATARACT EXTRACTION W/PHACO Right 04/29/2017   Procedure: CATARACT EXTRACTION PHACO AND INTRAOCULAR LENS PLACEMENT (IOC) RIGHT;  Surgeon:  Eulogio Bear, MD;  Location: [place];  Service: Ophthalmology;  Laterality: Right;  . CATARACT EXTRACTION W/PHACO Left 07/15/2017   Procedure: CATARACT EXTRACTION PHACO AND INTRAOCULAR  LENS PLACEMENT ([place]) LEFT;  Surgeon: Eulogio Bear, MD;  Location: [place];  Service: Ophthalmology;  Laterality: Left;  . COLONOSCOPY WITH PROPOFOL N/A 02/06/2015   Procedure: COLONOSCOPY WITH PROPOFOL;  Surgeon: [name], MD;  Location: [place];  Service: Endoscopy;  Laterality: N/A;  . EYE SURGERY  2019  . TUBAL LIGATION  1980s  . VAGINAL HYSTERECTOMY  1990s   one ovary removed as well    Social History   Tobacco Use  . Smoking status: Former Smoker    Packs/day: 0.50    Years: 50.00    Pack years: 25.00    Types: Cigarettes, E-cigarettes    Quit date: 11/22/2011    Years since quitting: 8.2  . Smokeless tobacco: Never Used  . Tobacco comment: quit 2014  Vaping Use  . Vaping Use: Never used  Substance Use Topics  . Alcohol use: Yes    Alcohol/week: 2.0 standard drinks    Types: 2 Glasses of wine per week  . Drug use: No     Medication list has been reviewed and updated.  Current Meds  Medication Sig  . acetaminophen (TYLENOL) 500 MG tablet Take 500 mg by mouth every 6 (six) hours as needed.  Marland Kitchen aspirin EC 81 MG tablet Take by mouth.  Marland Kitchen atorvastatin (LIPITOR) 10 MG tablet Take 1 tablet (10 mg total) by mouth daily.  . cholecalciferol (VITAMIN D) 1000 units tablet Take 1,000 Units by mouth daily.  Marland Kitchen losartan-hydrochlorothiazide (HYZAAR) 50-12.5 MG tablet Take 1 tablet by mouth daily.  . Multiple Vitamins-Minerals (CENTRUM SILVER 50+WOMEN) TABS Take by mouth.  . Omega 3 1000 MG CAPS Take 1 capsule by mouth daily.  . pantoprazole (PROTONIX) 40 MG tablet Take 1 tablet (40 mg total) by mouth daily.  Marland Kitchen triamcinolone (KENALOG) 0.1 % Apply 1 application topically 2 (two) times daily.  . Zinc Oxide-Vitamin C (ZINC PLUS VITAMIN C PO) Take 1 tablet by mouth daily.    PHQ 2/9 Scores 03/03/2020 02/09/2020 01/31/2020 11/30/2019  PHQ - 2 Score 0 0 0 0  PHQ- 9 Score 0 - 0 0    GAD 7 : Generalized Anxiety Score 03/03/2020 11/30/2019 08/20/2019 06/11/2019   Nervous, Anxious, on Edge 0 0 0 0  Control/stop worrying 0 0 0 0  Worry too much - different things 0 0 0 0  Trouble relaxing 0 0 0 0  Restless 0 0 0 0  Easily annoyed or irritable 0 0 0 0  Afraid - awful might happen 0 0 0 0  Total GAD 7 Score 0 0 0 0  Anxiety Difficulty - - - Not difficult at all    BP Readings from Last 3 Encounters:  03/03/20 130/80  01/31/20 138/80  11/30/19 138/80    Physical Exam Vitals and nursing note reviewed.  Constitutional:      Appearance: She is well-developed and well-nourished.  HENT:     Head: Normocephalic.     Right Ear: Tympanic membrane, ear canal and external ear normal. There is no impacted cerumen.     Left Ear: Tympanic membrane, ear canal and external ear normal. There is no impacted cerumen.     Nose: Nose normal. No congestion or rhinorrhea.     Mouth/Throat:     Mouth: Oropharynx is clear and moist. Mucous membranes  are moist.  Eyes:     General: Lids are everted, no foreign bodies appreciated. No scleral icterus.       Left eye: No foreign body or hordeolum.     Extraocular Movements: EOM normal.     Conjunctiva/sclera: Conjunctivae normal.     Right eye: Right conjunctiva is not injected.     Left eye: Left conjunctiva is not injected.     Pupils: Pupils are equal, round, and reactive to light.  Neck:     Thyroid: No thyromegaly.     Vascular: No JVD.     Trachea: No tracheal deviation.  Cardiovascular:     Rate and Rhythm: Normal rate and regular rhythm.     Pulses: Intact distal pulses.     Heart sounds: Normal heart sounds. No murmur heard. No friction rub. No gallop.   Pulmonary:     Effort: Pulmonary effort is normal. No respiratory distress.     Breath sounds: Normal breath sounds. No wheezing, rhonchi or rales.  Abdominal:     General: Bowel sounds are normal.     Palpations: Abdomen is soft. There is no hepatosplenomegaly or mass.     Tenderness: There is no abdominal tenderness. There is no guarding or  rebound.  Musculoskeletal:        General: No tenderness or edema. Normal range of motion.     Cervical back: Normal range of motion and neck supple.  Lymphadenopathy:     Cervical: No cervical adenopathy.  Skin:    General: Skin is warm.     Findings: No rash.  Neurological:     Mental Status: She is alert and oriented to person, place, and time.     Cranial Nerves: No cranial nerve deficit.     Deep Tendon Reflexes: Strength normal. Reflexes normal.  Psychiatric:        Mood and Affect: Mood and affect normal. Mood is not anxious or depressed.     Wt Readings from Last 3 Encounters:  03/03/20 149 lb (67.6 kg)  01/31/20 148 lb (67.1 kg)  11/30/19 147 lb (66.7 kg)    BP 130/80   Pulse 60   Ht 5\' 1"  (1.549 m)   Wt 149 lb (67.6 kg)   BMI 28.15 kg/m   Assessment and Plan:  1. H/O total hysterectomy Patient with history of hysterectomy as noted.  2. Lumbar disc disease New onset.  Persistent.  Relatively stable.  Although she is at an 8/10.  Patient has a history of a cervical disc for which she has had an injection with Dr. Kenna Gilbert is.  We will initiate prednisone 10 mg once a day, tizanidine 4 mg one twice a day primarily at night, and meloxicam 7.5 mg once a day which I noted her last creatinine was normal and I think that she should be able to tolerate.  We'll recheck patient on an as-needed basis and and unresolved we may need to refer to orthopedics for evaluation of discopathy. - predniSONE (DELTASONE) 10 MG tablet; Take 1 tablet (10 mg total) by mouth daily with breakfast.  Dispense: 30 tablet; Refill: 0 - tiZANidine (ZANAFLEX) 4 MG capsule; Take 1 capsule (4 mg total) by mouth 3 (three) times daily.  Dispense: 30 capsule; Refill: 1 - meloxicam (MOBIC) 7.5 MG tablet; Take 1 tablet (7.5 mg total) by mouth daily.  Dispense: 30 tablet; Refill: 0

## 2020-04-10 ENCOUNTER — Encounter: Payer: Self-pay | Admitting: Family Medicine

## 2020-04-10 ENCOUNTER — Ambulatory Visit (INDEPENDENT_AMBULATORY_CARE_PROVIDER_SITE_OTHER): Payer: Medicare HMO | Admitting: Family Medicine

## 2020-04-10 ENCOUNTER — Other Ambulatory Visit: Payer: Self-pay

## 2020-04-10 VITALS — BP 130/80 | HR 60 | Temp 98.2°F | Ht 61.0 in | Wt 150.0 lb

## 2020-04-10 DIAGNOSIS — J301 Allergic rhinitis due to pollen: Secondary | ICD-10-CM | POA: Diagnosis not present

## 2020-04-10 DIAGNOSIS — J0100 Acute maxillary sinusitis, unspecified: Secondary | ICD-10-CM

## 2020-04-10 MED ORDER — AZITHROMYCIN 250 MG PO TABS: ORAL_TABLET | ORAL | 0 refills | Status: DC

## 2020-04-10 NOTE — Progress Notes (Signed)
Date:  04/10/2020   Name:  Emily Boyer   DOB:  [birth year]   MRN:  [id number]   Chief Complaint: Sinusitis (White production, nasal drainage, headache, eyes swollen- taking mucinex not helping)  Sinusitis This is a new problem. The current episode started in the past 7 days (Friday). The problem has been gradually worsening since onset. There has been no fever. The pain is mild. Associated symptoms include congestion, coughing, headaches, sinus pressure and sneezing. Pertinent negatives include no chills, ear pain, shortness of breath, sore throat or swollen glands. Past treatments include nothing.    Lab Results  Component Value Date   CREATININE 0.95 02/01/2020   BUN 16 02/01/2020   NA 141 02/01/2020   K 4.4 02/01/2020   CL 98 02/01/2020   CO2 26 02/01/2020   Lab Results  Component Value Date   CHOL 193 02/01/2020   HDL 40 02/01/2020   LDLCALC 115 (H) 02/01/2020   TRIG 214 (H) 02/01/2020   CHOLHDL 5.3 (H) 12/04/2016   Lab Results  Component Value Date   TSH 1.240 08/20/2019   No results found for: HGBA1C Lab Results  Component Value Date   WBC 11.0 (H) 01/12/2019   HGB 13.9 01/12/2019   HCT 41.3 01/12/2019   MCV 88 01/12/2019   PLT 335 01/12/2019   Lab Results  Component Value Date   ALT 17 08/20/2019   AST 19 08/20/2019   ALKPHOS 80 08/20/2019   BILITOT 0.3 08/20/2019     Review of Systems  Constitutional: Negative.  Negative for chills, fatigue, fever and unexpected weight change.  HENT: Positive for congestion, sinus pressure and sneezing. Negative for ear discharge, ear pain, rhinorrhea and sore throat.   Eyes: Negative for photophobia, pain, discharge, redness and itching.  Respiratory: Positive for cough. Negative for shortness of breath, wheezing and stridor.   Gastrointestinal: Negative for abdominal pain, blood in stool, constipation, diarrhea, nausea and vomiting.  Endocrine: Negative for cold intolerance, heat intolerance, polydipsia,  polyphagia and polyuria.  Genitourinary: Negative for dysuria, flank pain, frequency, hematuria, menstrual problem, pelvic pain, urgency, vaginal bleeding and vaginal discharge.  Musculoskeletal: Negative for arthralgias, back pain and myalgias.  Skin: Negative for rash.  Allergic/Immunologic: Negative for environmental allergies and food allergies.  Neurological: Positive for headaches. Negative for dizziness, weakness, light-headedness and numbness.  Hematological: Negative for adenopathy. Does not bruise/bleed easily.  Psychiatric/Behavioral: Negative for dysphoric mood. The patient is not nervous/anxious.     Patient Active Problem List   Diagnosis Date Noted  . H/O total hysterectomy 03/03/2020  . DDD (degenerative disc disease), cervical 05/28/2018  . Sebaceous cyst 12/20/2015  . Abnormal findings-gastrointestinal tract   . Encounter for general adult medical examination without abnormal findings 07/04/2014  . Nerve root pain 07/04/2014  . Gastroesophageal reflux disease 07/04/2014  . HLD (hyperlipidemia) 07/04/2014  . Hypertension 07/04/2014    Allergies  Allergen Reactions  . Guaifenesin & Derivatives   . Methocarbamol Itching  . Penicillins Itching  . Tramadol Hcl Itching    Past Surgical History:  Procedure Laterality Date  . BREAST BIOPSY Right 2004   neg  . CATARACT EXTRACTION W/PHACO Right 04/29/2017   Procedure: CATARACT EXTRACTION PHACO AND INTRAOCULAR LENS PLACEMENT (IOC) RIGHT;  Surgeon: Eulogio Bear, MD;  Location: [place];  Service: Ophthalmology;  Laterality: Right;  . CATARACT EXTRACTION W/PHACO Left 07/15/2017   Procedure: CATARACT EXTRACTION PHACO AND INTRAOCULAR LENS PLACEMENT ([place]) LEFT;  Surgeon: Eulogio Bear, MD;  Location:  [place];  Service: Ophthalmology;  Laterality: Left;  . COLONOSCOPY WITH PROPOFOL N/A 02/06/2015   Procedure: COLONOSCOPY WITH PROPOFOL;  Surgeon: [name], MD;  Location: [place];   Service: Endoscopy;  Laterality: N/A;  . EYE SURGERY  2019  . TUBAL LIGATION  1980s  . VAGINAL HYSTERECTOMY  1990s   one ovary removed as well    Social History   Tobacco Use  . Smoking status: Former Smoker    Packs/day: 0.50    Years: 50.00    Pack years: 25.00    Types: Cigarettes, E-cigarettes    Quit date: 11/22/2011    Years since quitting: 8.3  . Smokeless tobacco: Never Used  . Tobacco comment: quit 2014  Vaping Use  . Vaping Use: Never used  Substance Use Topics  . Alcohol use: Yes    Alcohol/week: 2.0 standard drinks    Types: 2 Glasses of wine per week  . Drug use: No     Medication list has been reviewed and updated.  Current Meds  Medication Sig  . acetaminophen (TYLENOL) 500 MG tablet Take 500 mg by mouth every 6 (six) hours as needed.  Marland Kitchen aspirin EC 81 MG tablet Take by mouth.  Marland Kitchen atorvastatin (LIPITOR) 10 MG tablet Take 1 tablet (10 mg total) by mouth daily.  . cholecalciferol (VITAMIN D) 1000 units tablet Take 1,000 Units by mouth daily.  Marland Kitchen losartan-hydrochlorothiazide (HYZAAR) 50-12.5 MG tablet Take 1 tablet by mouth daily.  . Multiple Vitamins-Minerals (CENTRUM SILVER 50+WOMEN) TABS Take by mouth.  . Omega 3 1000 MG CAPS Take 1 capsule by mouth daily.  . pantoprazole (PROTONIX) 40 MG tablet Take 1 tablet (40 mg total) by mouth daily.  Marland Kitchen tiZANidine (ZANAFLEX) 4 MG capsule Take 1 capsule (4 mg total) by mouth 3 (three) times daily.  Marland Kitchen triamcinolone (KENALOG) 0.1 % Apply 1 application topically 2 (two) times daily.  . Zinc Oxide-Vitamin C (ZINC PLUS VITAMIN C PO) Take 1 tablet by mouth daily.    PHQ 2/9 Scores 03/03/2020 02/09/2020 01/31/2020 11/30/2019  PHQ - 2 Score 0 0 0 0  PHQ- 9 Score 0 - 0 0    GAD 7 : Generalized Anxiety Score 03/03/2020 11/30/2019 08/20/2019 06/11/2019  Nervous, Anxious, on Edge 0 0 0 0  Control/stop worrying 0 0 0 0  Worry too much - different things 0 0 0 0  Trouble relaxing 0 0 0 0  Restless 0 0 0 0  Easily annoyed or irritable  0 0 0 0  Afraid - awful might happen 0 0 0 0  Total GAD 7 Score 0 0 0 0  Anxiety Difficulty - - - Not difficult at all    BP Readings from Last 3 Encounters:  04/10/20 130/80  03/03/20 130/80  01/31/20 138/80    Physical Exam Vitals and nursing note reviewed.  Constitutional:      Appearance: She is well-developed.  HENT:     Head: Normocephalic.     Right Ear: Tympanic membrane, ear canal and external ear normal.     Left Ear: Tympanic membrane, ear canal and external ear normal.     Nose: Congestion and rhinorrhea present.     Right Turbinates: Swollen.     Left Turbinates: Swollen.     Right Sinus: Maxillary sinus tenderness present.     Left Sinus: Maxillary sinus tenderness present.     Mouth/Throat:     Mouth: Mucous membranes are moist.  Eyes:  General: Lids are everted, no foreign bodies appreciated. No scleral icterus.       Left eye: No foreign body or hordeolum.     Conjunctiva/sclera: Conjunctivae normal.     Right eye: Right conjunctiva is not injected.     Left eye: Left conjunctiva is not injected.     Pupils: Pupils are equal, round, and reactive to light.  Neck:     Thyroid: No thyromegaly.     Vascular: No JVD.     Trachea: No tracheal deviation.  Cardiovascular:     Rate and Rhythm: Normal rate and regular rhythm.     Heart sounds: Normal heart sounds. No murmur heard. No friction rub. No gallop.   Pulmonary:     Effort: Pulmonary effort is normal. No respiratory distress.     Breath sounds: Normal breath sounds. No wheezing or rales.  Abdominal:     General: Bowel sounds are normal.     Palpations: Abdomen is soft. There is no mass.     Tenderness: There is no abdominal tenderness. There is no guarding or rebound.  Musculoskeletal:        General: No tenderness. Normal range of motion.     Cervical back: Normal range of motion and neck supple.  Lymphadenopathy:     Cervical: No cervical adenopathy.  Skin:    General: Skin is warm.      Findings: No rash.  Neurological:     Mental Status: She is alert and oriented to person, place, and time.     Cranial Nerves: No cranial nerve deficit.     Deep Tendon Reflexes: Reflexes normal.  Psychiatric:        Mood and Affect: Mood is not anxious or depressed.     Wt Readings from Last 3 Encounters:  04/10/20 150 lb (68 kg)  03/03/20 149 lb (67.6 kg)  01/31/20 148 lb (67.1 kg)    BP 130/80   Pulse 60   Temp 98.2 F (36.8 C) (Oral)   Ht 5\' 1"  (1.549 m)   Wt 150 lb (68 kg)   BMI 28.34 kg/m   Assessment and Plan:  1. Acute non-recurrent maxillary sinusitis New onset.  Persistent.  Stable.  Onset 48 hours previous.  Will initiate a azithromycin to 50 mg 2 today followed by 1 a day for 4 days. - azithromycin (ZITHROMAX) 250 MG tablet; 2 today then 1 a day for 4 days  Dispense: 6 tablet; Refill: 0  2. Seasonal allergic rhinitis due to pollen Chronic.  Episodic.  Currently exacerbated by heavy tree pollen count.  Patient has been suggested to resume her Zyrtec, Flonase, as well as Mucinex DM for tickling cough.

## 2020-04-27 ENCOUNTER — Encounter: Payer: Self-pay | Admitting: Emergency Medicine

## 2020-04-27 ENCOUNTER — Ambulatory Visit
Admission: EM | Admit: 2020-04-27 | Discharge: 2020-04-27 | Disposition: A | Discharge status: Home / Self Care | Payer: Medicare HMO | Attending: Sports Medicine | Admitting: Sports Medicine

## 2020-04-27 ENCOUNTER — Ambulatory Visit (INDEPENDENT_AMBULATORY_CARE_PROVIDER_SITE_OTHER): Payer: Medicare HMO

## 2020-04-27 ENCOUNTER — Other Ambulatory Visit: Payer: Self-pay

## 2020-04-27 DIAGNOSIS — M25532 Pain in left wrist: Secondary | ICD-10-CM

## 2020-04-27 DIAGNOSIS — S63502A Unspecified sprain of left wrist, initial encounter: Secondary | ICD-10-CM | POA: Diagnosis not present

## 2020-04-27 MED ORDER — NAPROXEN 250 MG PO TABS: 250.0000 mg | ORAL_TABLET | Freq: Two times a day (BID) | ORAL | 0 refills | Status: AC

## 2020-04-27 NOTE — Discharge Instructions (Addendum)
I have sent naproxen which is an anti-inflammatory.  You cannot take meloxicam if you take the naproxen.  Take 1 or the other but not both.  You can take Tylenol though.  SPRAIN: Stressed avoiding painful activities . Reviewed RICE guidelines. Use medications as directed, including NSAIDs. If no NSAIDs have been prescribed for you today, you may take Aleve or Motrin over the counter. May use Tylenol in between doses of NSAIDs.  If no improvement in the next 1-2 weeks, f/u with PCP or return to our office for reexamination, and please feel free to call or return at any time for any questions or concerns you may have and we will be happy to help you!

## 2020-04-27 NOTE — ED Triage Notes (Signed)
Patient c/o left wrist pain that started yesterday afternoon. She states she pushed up a window with the palm of her hand and afterwards she has continued to have pain.

## 2020-04-27 NOTE — ED Provider Notes (Signed)
[facility]    CSN: [id number] Arrival date & time: 04/27/20  3875      History   Chief Complaint Chief Complaint  Patient presents with  . Wrist Pain    HPI Emily Boyer is a 75 y.o. right handed female presenting for left wrist pain since yesterday.  Patient says that she was pushing a heavy window up and had pain shortly after that.  Pain is mostly of the wrist ventrally.  She admits to pain with any movement of the wrist.  No numbness, weakness or tingling associated with it.  No direct trauma to the wrist.  Denies similar problem with her wrist or hand in the past.  Has tried Tylenol with a little bit of improvement in her pain but no other medications.  No other complaints or concerns.  HPI  Past Medical History:  Diagnosis Date  . Allergy Sinus  . Bone spur    right side of neck  . Colitis   . DDD (degenerative disc disease), cervical   . Diverticulitis   . GERD (gastroesophageal reflux disease)   . Heart murmur    followed by PCP  . HLD (hyperlipidemia)   . Hypertension   . Seasonal allergies     Patient Active Problem List   Diagnosis Date Noted  . H/O total hysterectomy 03/03/2020  . DDD (degenerative disc disease), cervical 05/28/2018  . Sebaceous cyst 12/20/2015  . Abnormal findings-gastrointestinal tract   . Encounter for general adult medical examination without abnormal findings 07/04/2014  . Nerve root pain 07/04/2014  . Gastroesophageal reflux disease 07/04/2014  . HLD (hyperlipidemia) 07/04/2014  . Hypertension 07/04/2014    Past Surgical History:  Procedure Laterality Date  . BREAST BIOPSY Right 2004   neg  . CATARACT EXTRACTION W/PHACO Right 04/29/2017   Procedure: CATARACT EXTRACTION PHACO AND INTRAOCULAR LENS PLACEMENT ([facility]) RIGHT;  Surgeon: Eulogio Bear, MD;  Location: [place];  Service: Ophthalmology;  Laterality: Right;  . CATARACT EXTRACTION W/PHACO Left 07/15/2017   Procedure: CATARACT EXTRACTION PHACO  AND INTRAOCULAR LENS PLACEMENT ([place]) LEFT;  Surgeon: Eulogio Bear, MD;  Location: [place];  Service: Ophthalmology;  Laterality: Left;  . COLONOSCOPY WITH PROPOFOL N/A 02/06/2015   Procedure: COLONOSCOPY WITH PROPOFOL;  Surgeon: [name], MD;  Location: [place];  Service: Endoscopy;  Laterality: N/A;  . EYE SURGERY  2019  . TUBAL LIGATION  1980s  . VAGINAL HYSTERECTOMY  1990s   one ovary removed as well    OB History   No obstetric history on file.      Home Medications    Prior to Admission medications   Medication Sig Start Date End Date Taking? Authorizing Provider  acetaminophen (TYLENOL) 500 MG tablet Take 500 mg by mouth every 6 (six) hours as needed.   Yes [provider]  aspirin EC 81 MG tablet Take by mouth.   Yes [provider]  atorvastatin (LIPITOR) 10 MG tablet Take 1 tablet (10 mg total) by mouth daily. 01/31/20  Yes Juline Patch, MD  cholecalciferol (VITAMIN D) 1000 units tablet Take 1,000 Units by mouth daily.   Yes [provider]  losartan-hydrochlorothiazide (HYZAAR) 50-12.5 MG tablet Take 1 tablet by mouth daily. 01/31/20  Yes Juline Patch, MD  Multiple Vitamins-Minerals (CENTRUM SILVER 50+WOMEN) TABS Take by mouth.   Yes [provider]  naproxen (NAPROSYN) 250 MG tablet Take 1 tablet (250 mg total) by mouth 2 (two) times daily with  a meal for 10 days. 04/27/20 05/07/20 Yes Laurene Footman B, PA-C  Omega 3 1000 MG CAPS Take 1 capsule by mouth daily.   Yes [provider]  pantoprazole (PROTONIX) 40 MG tablet Take 1 tablet (40 mg total) by mouth daily. 01/31/20  Yes Juline Patch, MD  triamcinolone (KENALOG) 0.1 % Apply 1 application topically 2 (two) times daily. 01/31/20  Yes Juline Patch, MD  Zinc Oxide-Vitamin C (ZINC PLUS VITAMIN C PO) Take 1 tablet by mouth daily.   Yes [provider]  azithromycin (ZITHROMAX) 250 MG tablet 2 today then 1 a day for 4 days 04/10/20   Juline Patch, MD  tiZANidine (ZANAFLEX) 4 MG capsule Take 1 capsule (4 mg total) by mouth 3 (three) times daily. 03/03/20   Juline Patch, MD    Family History Family History  Problem Relation Age of Onset  . Stroke Mother   . Hypertension Mother   . Arthritis Father   . Heart disease Paternal Aunt   . Cancer Paternal Grandmother   . Stomach cancer Paternal Grandmother   . Breast cancer Cousin        pat cousin    Social History Social History   Tobacco Use  . Smoking status: Former Smoker    Packs/day: 0.50    Years: 50.00    Pack years: 25.00    Types: Cigarettes, E-cigarettes    Quit date: 11/22/2011    Years since quitting: 8.4  . Smokeless tobacco: Never Used  . Tobacco comment: quit 2014  Vaping Use  . Vaping Use: Never used  Substance Use Topics  . Alcohol use: Yes    Alcohol/week: 2.0 standard drinks    Types: 2 Glasses of wine per week  . Drug use: No     Allergies   Guaifenesin & derivatives, Methocarbamol, Penicillins, and Tramadol hcl   Review of Systems Review of Systems  Musculoskeletal: Positive for arthralgias. Negative for joint swelling.  Skin: Negative for color change, rash and wound.  Neurological: Negative for weakness and numbness.     Physical Exam Triage Vital Signs ED Triage Vitals  Enc Vitals Group     BP 04/27/20 0843 (!) 148/85     Pulse Rate 04/27/20 0843 61     Resp 04/27/20 0843 18     Temp 04/27/20 0843 98.1 F (36.7 C)     Temp Source 04/27/20 0843 Oral     SpO2 04/27/20 0843 100 %     Weight 04/27/20 0841 149 lb 14.6 oz (68 kg)     Height 04/27/20 0841 5\' 1"  (1.549 m)     Head Circumference --      Peak Flow --      Pain Score 04/27/20 0841 8     Pain Loc --      Pain Edu? --      Excl. in [place]? --    No data found.  Updated Vital Signs BP (!) 148/85 (BP Location: Right Arm)   Pulse 61   Temp 98.1 F (36.7 C) (Oral)   Resp 18   Ht 5\' 1"  (1.549 m)   Wt 149 lb 14.6 oz (68 kg)   SpO2 100%   BMI 28.33 kg/m        Physical Exam Vitals and nursing note reviewed.  Constitutional:      General: She is not in acute distress.    Appearance: Normal appearance. She is not ill-appearing or toxic-appearing.  HENT:  Head: Normocephalic and atraumatic.  Eyes:     General: No scleral icterus.       Right eye: No discharge.        Left eye: No discharge.     Conjunctiva/sclera: Conjunctivae normal.  Cardiovascular:     Rate and Rhythm: Normal rate and regular rhythm.     Pulses: Normal pulses.  Pulmonary:     Effort: Pulmonary effort is normal. No respiratory distress.  Musculoskeletal:     Left wrist: Tenderness (diffuse TTP of wrist, but no bony tenderness. Most tenderness of ventral aspect of wrist. ) present. No swelling, deformity or snuff box tenderness. Decreased range of motion.     Cervical back: Neck supple.  Skin:    General: Skin is dry.  Neurological:     General: No focal deficit present.     Mental Status: She is alert. Mental status is at baseline.     Motor: No weakness.     Gait: Gait normal.  Psychiatric:        Mood and Affect: Mood normal.        Behavior: Behavior normal.        Thought Content: Thought content normal.      UC Treatments / Results  Labs (all labs ordered are listed, but only abnormal results are displayed) Labs Reviewed - No data to display  EKG   Radiology DG Wrist Complete Left  Result Date: 04/27/2020 CLINICAL DATA:  75 year old female with wrist pain since yesterday after exertion. EXAM: LEFT WRIST - COMPLETE 3+ VIEW COMPARISON:  None. FINDINGS: Bone mineralization is within normal limits for age. Distal radius and ulna are intact. Carpal bones appear intact and normally aligned. [facility] joint space loss, subchondral sclerosis and osteophytosis. Proximal metacarpals appear intact. No acute osseous abnormality identified. No discrete soft tissue abnormality. 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 No acute osseous abnormality identified.  Electronically Signed   By: Genevie Ann M.D.   On: 04/27/2020 09:13    Procedures Procedures (including critical care time)  Medications Ordered in UC Medications - No data to display  Initial Impression / Assessment and Plan / UC Course  I have reviewed the triage vital signs and the nursing notes.  Pertinent labs & imaging results that were available during my care of the patient were reviewed by me and considered in my medical decision making (see chart for details).   75 year old female presenting for left wrist pain following minor injury yesterday.  She does have diffuse tenderness throughout the ventral wrist.  Decreased flexion and extension of wrist due to pain.  She does not have any tenderness of her hand.  X-ray of left wrist obtained.  X-rays only show osteoarthritis of the first [place].  Reviewed results with patient.  Advised her if no fractures or acute injuries of her bones or joints.  Advised her she has sprained her wrist and care is supportive with RICE, NSAIDs and Tylenol for pain relief.  Wrist brace provided.  Advised to follow with PCP or Ortho if not improving over the next 2 weeks or for worsening symptoms.  Work note given.   Final Clinical Impressions(s) / UC Diagnoses   Final diagnoses:  Sprain of left wrist, initial encounter  Left wrist pain     Discharge Instructions     I have sent naproxen which is an anti-inflammatory.  You cannot take meloxicam if you take the naproxen.  Take 1 or the other but not both.  You can take  Tylenol though.  SPRAIN: Stressed avoiding painful activities . Reviewed RICE guidelines. Use medications as directed, including NSAIDs. If no NSAIDs have been prescribed for you today, you may take Aleve or Motrin over the counter. May use Tylenol in between doses of NSAIDs.  If no improvement in the next 1-2 weeks, f/u with PCP or return to our office for reexamination, and please feel free to call or return at any time for any questions or  concerns you may have and we will be happy to help you!        ED Prescriptions    Medication Sig Dispense Auth. Provider   naproxen (NAPROSYN) 250 MG tablet Take 1 tablet (250 mg total) by mouth 2 (two) times daily with a meal for 10 days. 20 tablet Gretta Cool     PDMP not reviewed this encounter.   Danton Clap, PA-C 04/27/20 1051

## 2020-07-31 ENCOUNTER — Ambulatory Visit (INDEPENDENT_AMBULATORY_CARE_PROVIDER_SITE_OTHER): Payer: Medicare HMO | Admitting: Family Medicine

## 2020-07-31 ENCOUNTER — Encounter: Payer: Self-pay | Admitting: Family Medicine

## 2020-07-31 ENCOUNTER — Other Ambulatory Visit: Payer: Self-pay

## 2020-07-31 VITALS — BP 130/64 | HR 60 | Ht 61.0 in | Wt 148.0 lb

## 2020-07-31 DIAGNOSIS — E782 Mixed hyperlipidemia: Secondary | ICD-10-CM | POA: Diagnosis not present

## 2020-07-31 DIAGNOSIS — K219 Gastro-esophageal reflux disease without esophagitis: Secondary | ICD-10-CM

## 2020-07-31 DIAGNOSIS — I10 Essential (primary) hypertension: Secondary | ICD-10-CM | POA: Diagnosis not present

## 2020-07-31 DIAGNOSIS — M519 Unspecified thoracic, thoracolumbar and lumbosacral intervertebral disc disorder: Secondary | ICD-10-CM

## 2020-07-31 MED ORDER — PANTOPRAZOLE SODIUM 40 MG PO TBEC: 40.0000 mg | DELAYED_RELEASE_TABLET | Freq: Every day | ORAL | 1 refills | Status: DC

## 2020-07-31 MED ORDER — ATORVASTATIN CALCIUM 10 MG PO TABS: 10.0000 mg | ORAL_TABLET | Freq: Every day | ORAL | 1 refills | Status: DC

## 2020-07-31 MED ORDER — TIZANIDINE HCL 4 MG PO CAPS: 4.0000 mg | ORAL_CAPSULE | Freq: Three times a day (TID) | ORAL | 1 refills | Status: DC

## 2020-07-31 MED ORDER — LOSARTAN POTASSIUM-HCTZ 50-12.5 MG PO TABS
1.0000 | ORAL_TABLET | Freq: Every day | ORAL | 1 refills | Status: DC
Start: 2020-07-31 — End: 2021-01-31

## 2020-07-31 NOTE — Progress Notes (Signed)
Date:  07/31/2020   Name:  Emily Boyer   DOB:  1945/06/22   MRN:  [id number]   Chief Complaint: Hyperlipidemia, Gastroesophageal Reflux, Hypertension, and back spasms (Takes tizanidine as needed)  Hyperlipidemia This is a chronic problem. The current episode started more than 1 year ago. The problem is controlled. Recent lipid tests were reviewed and are normal. She has no history of chronic renal disease, diabetes, hypothyroidism, liver disease, obesity or nephrotic syndrome. Factors aggravating her hyperlipidemia include thiazides. Pertinent negatives include no chest pain, myalgias or shortness of breath. Current antihyperlipidemic treatment includes statins. The current treatment provides moderate improvement of lipids. Risk factors for coronary artery disease include dyslipidemia and hypertension.  Gastroesophageal Reflux She reports no abdominal pain, no chest pain, no coughing, no dysphagia, no heartburn, no nausea, no sore throat or no wheezing. The problem has been gradually improving. She has tried a PPI for the symptoms. The treatment provided moderate relief.  Hypertension This is a chronic problem. The problem has been gradually improving since onset. The problem is controlled. Pertinent negatives include no anxiety, chest pain, headaches, neck pain, palpitations or shortness of breath. Risk factors for coronary artery disease include dyslipidemia. Past treatments include angiotensin blockers and diuretics. The current treatment provides moderate improvement. There are no compliance problems.  There is no history of angina, kidney disease, CAD/MI, CVA, heart failure, left ventricular hypertrophy, PVD or retinopathy. There is no history of chronic renal disease.  Back Pain The pain is present in the lumbar spine. The quality of the pain is described as aching. The pain does not radiate. Pertinent negatives include no abdominal pain, chest pain, dysuria, fever or headaches.   Lab  Results  Component Value Date   CREATININE 0.95 02/01/2020   BUN 16 02/01/2020   NA 141 02/01/2020   K 4.4 02/01/2020   CL 98 02/01/2020   CO2 26 02/01/2020   Lab Results  Component Value Date   CHOL 193 02/01/2020   HDL 40 02/01/2020   LDLCALC 115 (H) 02/01/2020   TRIG 214 (H) 02/01/2020   CHOLHDL 5.3 (H) 12/04/2016   Lab Results  Component Value Date   TSH 1.240 08/20/2019   No results found for: HGBA1C Lab Results  Component Value Date   WBC 11.0 (H) 01/12/2019   HGB 13.9 01/12/2019   HCT 41.3 01/12/2019   MCV 88 01/12/2019   PLT 335 01/12/2019   Lab Results  Component Value Date   ALT 17 08/20/2019   AST 19 08/20/2019   ALKPHOS 80 08/20/2019   BILITOT 0.3 08/20/2019     Review of Systems  Constitutional:  Negative for chills and fever.  HENT:  Negative for drooling, ear discharge, ear pain and sore throat.   Respiratory:  Negative for cough, shortness of breath and wheezing.   Cardiovascular:  Negative for chest pain, palpitations and leg swelling.  Gastrointestinal:  Negative for abdominal pain, blood in stool, constipation, diarrhea, dysphagia, heartburn and nausea.  Endocrine: Negative for polydipsia.  Genitourinary:  Negative for dysuria, frequency, hematuria and urgency.  Musculoskeletal:  Positive for back pain. Negative for myalgias and neck pain.  Skin:  Negative for rash.  Allergic/Immunologic: Negative for environmental allergies.  Neurological:  Negative for dizziness and headaches.  Hematological:  Does not bruise/bleed easily.  Psychiatric/Behavioral:  Negative for suicidal ideas. The patient is not nervous/anxious.    Patient Active Problem List   Diagnosis Date Noted   H/O total hysterectomy 03/03/2020   DDD (  degenerative disc disease), cervical 05/28/2018   Sebaceous cyst 12/20/2015   Abnormal findings-gastrointestinal tract    Encounter for general adult medical examination without abnormal findings 07/04/2014   Nerve root pain  07/04/2014   Gastroesophageal reflux disease 07/04/2014   HLD (hyperlipidemia) 07/04/2014   Hypertension 07/04/2014    Allergies  Allergen Reactions   Guaifenesin & Derivatives    Methocarbamol Itching   Penicillins Itching   Tramadol Hcl Itching    Past Surgical History:  Procedure Laterality Date   BREAST BIOPSY Right 2004   neg   CATARACT EXTRACTION W/PHACO Right 04/29/2017   Procedure: CATARACT EXTRACTION PHACO AND INTRAOCULAR LENS PLACEMENT ([place]) RIGHT;  Surgeon: Eulogio Bear, MD;  Location: [place];  Service: Ophthalmology;  Laterality: Right;   CATARACT EXTRACTION W/PHACO Left 07/15/2017   Procedure: CATARACT EXTRACTION PHACO AND INTRAOCULAR LENS PLACEMENT ([place]) LEFT;  Surgeon: Eulogio Bear, MD;  Location: [place];  Service: Ophthalmology;  Laterality: Left;   COLONOSCOPY WITH PROPOFOL N/A 02/06/2015   Procedure: COLONOSCOPY WITH PROPOFOL;  Surgeon: [name], MD;  Location: [place];  Service: Endoscopy;  Laterality: N/A;   EYE SURGERY  2019   TUBAL LIGATION  1980s   VAGINAL HYSTERECTOMY  1990s   one ovary removed as well    Social History   Tobacco Use   Smoking status: Former    Packs/day: 0.50    Years: 50.00    Pack years: 25.00    Types: Cigarettes, E-cigarettes    Quit date: 11/22/2011    Years since quitting: 8.6   Smokeless tobacco: Never   Tobacco comments:    quit 2014  Vaping Use   Vaping Use: Never used  Substance Use Topics   Alcohol use: Yes    Alcohol/week: 2.0 standard drinks    Types: 2 Glasses of wine per week   Drug use: No     Medication list has been reviewed and updated.  Current Meds  Medication Sig   acetaminophen (TYLENOL) 500 MG tablet Take 500 mg by mouth every 6 (six) hours as needed.   aspirin EC 81 MG tablet Take by mouth.   atorvastatin (LIPITOR) 10 MG tablet Take 1 tablet (10 mg total) by mouth daily.   cholecalciferol (VITAMIN D) 1000 units tablet Take 1,000 Units by mouth  daily.   losartan-hydrochlorothiazide (HYZAAR) 50-12.5 MG tablet Take 1 tablet by mouth daily.   Multiple Vitamins-Minerals (CENTRUM SILVER 50+WOMEN) TABS Take by mouth.   Omega 3 1000 MG CAPS Take 1 capsule by mouth daily.   pantoprazole (PROTONIX) 40 MG tablet Take 1 tablet (40 mg total) by mouth daily.   triamcinolone (KENALOG) 0.1 % Apply 1 application topically 2 (two) times daily.   Zinc Oxide-Vitamin C (ZINC PLUS VITAMIN C PO) Take 1 tablet by mouth daily.    PHQ 2/9 Scores 03/03/2020 02/09/2020 01/31/2020 11/30/2019  PHQ - 2 Score 0 0 0 0  PHQ- 9 Score 0 - 0 0    GAD 7 : Generalized Anxiety Score 03/03/2020 11/30/2019 08/20/2019 06/11/2019  Nervous, Anxious, on Edge 0 0 0 0  Control/stop worrying 0 0 0 0  Worry too much - different things 0 0 0 0  Trouble relaxing 0 0 0 0  Restless 0 0 0 0  Easily annoyed or irritable 0 0 0 0  Afraid - awful might happen 0 0 0 0  Total GAD 7 Score 0 0 0 0  Anxiety Difficulty - - - Not difficult at  all    BP Readings from Last 3 Encounters:  07/31/20 130/64  04/27/20 (!) 148/85  04/10/20 130/80    Physical Exam Vitals and nursing note reviewed.  Constitutional:      General: She is not in acute distress.    Appearance: She is not diaphoretic.  HENT:     Head: Normocephalic and atraumatic.     Right Ear: Tympanic membrane, ear canal and external ear normal.     Left Ear: Tympanic membrane, ear canal and external ear normal.     Nose: Nose normal.  Eyes:     General:        Right eye: No discharge.        Left eye: No discharge.     Conjunctiva/sclera: Conjunctivae normal.     Pupils: Pupils are equal, round, and reactive to light.  Neck:     Thyroid: No thyroid mass, thyromegaly or thyroid tenderness.     Vascular: Normal carotid pulses. No carotid bruit, hepatojugular reflux or JVD.     Trachea: Trachea normal.  Cardiovascular:     Rate and Rhythm: Normal rate and regular rhythm.     Heart sounds: Normal heart sounds. No murmur  heard.   No friction rub. No gallop.  Pulmonary:     Effort: Pulmonary effort is normal.     Breath sounds: Normal breath sounds.  Abdominal:     General: Bowel sounds are normal.     Palpations: Abdomen is soft. There is no mass.     Tenderness: There is no abdominal tenderness. There is no guarding.  Musculoskeletal:        General: Normal range of motion.     Cervical back: Normal range of motion and neck supple.     Lumbar back: Spasms present.  Lymphadenopathy:     Cervical: No cervical adenopathy.     Right cervical: No superficial, deep or posterior cervical adenopathy.    Left cervical: No superficial, deep or posterior cervical adenopathy.  Skin:    General: Skin is warm and dry.  Neurological:     Mental Status: She is alert.     Deep Tendon Reflexes: Reflexes are normal and symmetric.    Wt Readings from Last 3 Encounters:  07/31/20 148 lb (67.1 kg)  04/27/20 149 lb 14.6 oz (68 kg)  04/10/20 150 lb (68 kg)    BP 130/64   Pulse 60   Ht 5\' 1"  (1.549 m)   Wt 148 lb (67.1 kg)   BMI 27.96 kg/m   Assessment and Plan:  1. Essential hypertension Chronic.  Controlled.  Stable.  Blood pressure today is 130/64.  Continue losartan hydrochlorothiazide 50-12.5 mg once a day.  We will recheck in 6 months.  Review of patient's renal function panel is acceptable. - losartan-hydrochlorothiazide (HYZAAR) 50-12.5 MG tablet; Take 1 tablet by mouth daily.  Dispense: 90 tablet; Refill: 1  2. Mixed hyperlipidemia Chronic.  Controlled.  Stable.  Continue atorvastatin 10 mg once a day.  Review of lipid panel is acceptable we will recheck in 6 months. - atorvastatin (LIPITOR) 10 MG tablet; Take 1 tablet (10 mg total) by mouth daily.  Dispense: 90 tablet; Refill: 1  3. Gastroesophageal reflux disease, unspecified whether esophagitis present Chronic.  Controlled.  Stable.  Continue pantoprazole 40 mg once a day. - pantoprazole (PROTONIX) 40 MG tablet; Take 1 tablet (40 mg total) by  mouth daily.  Dispense: 90 tablet; Refill: 1  4. Lumbar disc disease Chronic.  Controlled.  Stable.  Patient is trying to avoid another injection at this point time would like to take as needed Tylenol with tizanidine on a as needed basis. - tiZANidine (ZANAFLEX) 4 MG capsule; Take 1 capsule (4 mg total) by mouth 3 (three) times daily.  Dispense: 30 capsule; Refill: 1

## 2020-08-20 DIAGNOSIS — I10 Essential (primary) hypertension: Secondary | ICD-10-CM | POA: Diagnosis not present

## 2020-08-20 DIAGNOSIS — E785 Hyperlipidemia, unspecified: Secondary | ICD-10-CM | POA: Diagnosis not present

## 2020-08-20 DIAGNOSIS — E663 Overweight: Secondary | ICD-10-CM | POA: Diagnosis not present

## 2020-08-20 DIAGNOSIS — Z87891 Personal history of nicotine dependence: Secondary | ICD-10-CM | POA: Diagnosis not present

## 2020-08-20 DIAGNOSIS — Z833 Family history of diabetes mellitus: Secondary | ICD-10-CM | POA: Diagnosis not present

## 2020-08-20 DIAGNOSIS — K219 Gastro-esophageal reflux disease without esophagitis: Secondary | ICD-10-CM | POA: Diagnosis not present

## 2020-08-20 DIAGNOSIS — Z8249 Family history of ischemic heart disease and other diseases of the circulatory system: Secondary | ICD-10-CM | POA: Diagnosis not present

## 2020-08-29 ENCOUNTER — Encounter: Payer: Self-pay | Admitting: Family Medicine

## 2020-08-30 DIAGNOSIS — M19011 Primary osteoarthritis, right shoulder: Secondary | ICD-10-CM | POA: Diagnosis not present

## 2020-08-30 DIAGNOSIS — M5412 Radiculopathy, cervical region: Secondary | ICD-10-CM | POA: Diagnosis not present

## 2020-08-30 DIAGNOSIS — M5030 Other cervical disc degeneration, unspecified cervical region: Secondary | ICD-10-CM | POA: Diagnosis not present

## 2020-10-10 ENCOUNTER — Encounter: Payer: Self-pay | Admitting: Family Medicine

## 2020-10-11 ENCOUNTER — Ambulatory Visit (INDEPENDENT_AMBULATORY_CARE_PROVIDER_SITE_OTHER): Payer: Medicare HMO | Admitting: Family Medicine

## 2020-10-11 ENCOUNTER — Other Ambulatory Visit: Payer: Self-pay

## 2020-10-11 ENCOUNTER — Encounter: Payer: Self-pay | Admitting: Family Medicine

## 2020-10-11 VITALS — BP 122/70 | HR 80 | Ht 61.0 in | Wt 148.0 lb

## 2020-10-11 DIAGNOSIS — J0100 Acute maxillary sinusitis, unspecified: Secondary | ICD-10-CM | POA: Diagnosis not present

## 2020-10-11 MED ORDER — AZITHROMYCIN 250 MG PO TABS: ORAL_TABLET | ORAL | 0 refills | Status: AC

## 2020-10-11 NOTE — Progress Notes (Addendum)
Date:  10/11/2020   Name:  Emily Boyer   DOB:  1945/01/25   MRN:  [id number]   Chief Complaint: Sinusitis (Sore around eyes, sneezing, congestion)  Sinusitis This is a new problem. The current episode started in the past 7 days. The problem has been gradually worsening since onset. There has been no fever. The fever has been present for 3 to 4 days. Her pain is at a severity of 6/10. The pain is mild. Associated symptoms include chills, congestion, coughing, headaches, sinus pressure and sneezing. Pertinent negatives include no diaphoresis, ear pain, hoarse voice, neck pain, shortness of breath, sore throat or swollen glands. Past treatments include sitting up, spray decongestants, acetaminophen and saline sprays. The treatment provided mild relief.   Lab Results  Component Value Date   CREATININE 0.95 02/01/2020   BUN 16 02/01/2020   NA 141 02/01/2020   K 4.4 02/01/2020   CL 98 02/01/2020   CO2 26 02/01/2020   Lab Results  Component Value Date   CHOL 193 02/01/2020   HDL 40 02/01/2020   LDLCALC 115 (H) 02/01/2020   TRIG 214 (H) 02/01/2020   CHOLHDL 5.3 (H) 12/04/2016   Lab Results  Component Value Date   TSH 1.240 08/20/2019   No results found for: HGBA1C Lab Results  Component Value Date   WBC 11.0 (H) 01/12/2019   HGB 13.9 01/12/2019   HCT 41.3 01/12/2019   MCV 88 01/12/2019   PLT 335 01/12/2019   Lab Results  Component Value Date   ALT 17 08/20/2019   AST 19 08/20/2019   ALKPHOS 80 08/20/2019   BILITOT 0.3 08/20/2019     Review of Systems  Constitutional:  Positive for chills. Negative for diaphoresis.  HENT:  Positive for congestion, sinus pressure and sneezing. Negative for ear pain, hoarse voice, postnasal drip and sore throat.   Respiratory:  Positive for cough. Negative for chest tightness, shortness of breath and wheezing.   Musculoskeletal:  Negative for neck pain.  Neurological:  Positive for headaches.   Patient Active Problem List    Diagnosis Date Noted  . H/O total hysterectomy 03/03/2020  . DDD (degenerative disc disease), cervical 05/28/2018  . Sebaceous cyst 12/20/2015  . Abnormal findings-gastrointestinal tract   . Encounter for general adult medical examination without abnormal findings 07/04/2014  . Nerve root pain 07/04/2014  . Gastroesophageal reflux disease 07/04/2014  . HLD (hyperlipidemia) 07/04/2014  . Hypertension 07/04/2014    Allergies  Allergen Reactions  . Guaifenesin & Derivatives   . Methocarbamol Itching  . Penicillins Itching  . Tramadol Hcl Itching    Past Surgical History:  Procedure Laterality Date  . BREAST BIOPSY Right 2004   neg  . CATARACT EXTRACTION W/PHACO Right 04/29/2017   Procedure: CATARACT EXTRACTION PHACO AND INTRAOCULAR LENS PLACEMENT ([facility]) RIGHT;  Surgeon: Eulogio Bear, MD;  Location: [facility];  Service: Ophthalmology;  Laterality: Right;  . CATARACT EXTRACTION W/PHACO Left 07/15/2017   Procedure: CATARACT EXTRACTION PHACO AND INTRAOCULAR LENS PLACEMENT ([place]) LEFT;  Surgeon: Eulogio Bear, MD;  Location: [facility];  Service: Ophthalmology;  Laterality: Left;  . COLONOSCOPY WITH PROPOFOL N/A 02/06/2015   Procedure: COLONOSCOPY WITH PROPOFOL;  Surgeon: [name], MD;  Location: [facility];  Service: Endoscopy;  Laterality: N/A;  . EYE SURGERY  2019  . TUBAL LIGATION  1980s  . VAGINAL HYSTERECTOMY  1990s   one ovary removed as well    Social History   Tobacco Use  .  Smoking status: Former    Packs/day: 0.50    Years: 50.00    Pack years: 25.00    Types: Cigarettes, E-cigarettes    Quit date: 11/22/2011    Years since quitting: 8.8  . Smokeless tobacco: Never  . Tobacco comments:    quit 2014  Vaping Use  . Vaping Use: Never used  Substance Use Topics  . Alcohol use: Yes    Alcohol/week: 2.0 standard drinks    Types: 2 Glasses of wine per week  . Drug use: No     Medication list has been reviewed and  updated.  Current Meds  Medication Sig  . acetaminophen (TYLENOL) 500 MG tablet Take 500 mg by mouth every 6 (six) hours as needed.  Marland Kitchen aspirin EC 81 MG tablet Take by mouth.  Marland Kitchen atorvastatin (LIPITOR) 10 MG tablet Take 1 tablet (10 mg total) by mouth daily.  . cholecalciferol (VITAMIN D) 1000 units tablet Take 1,000 Units by mouth daily.  Marland Kitchen losartan-hydrochlorothiazide (HYZAAR) 50-12.5 MG tablet Take 1 tablet by mouth daily.  . Multiple Vitamins-Minerals (CENTRUM SILVER 50+WOMEN) TABS Take by mouth.  . Omega 3 1000 MG CAPS Take 1 capsule by mouth daily.  . pantoprazole (PROTONIX) 40 MG tablet Take 1 tablet (40 mg total) by mouth daily.  Marland Kitchen tiZANidine (ZANAFLEX) 4 MG capsule Take 1 capsule (4 mg total) by mouth 3 (three) times daily.  Marland Kitchen triamcinolone (KENALOG) 0.1 % Apply 1 application topically 2 (two) times daily.  . Zinc Oxide-Vitamin C (ZINC PLUS VITAMIN C PO) Take 1 tablet by mouth daily.    PHQ 2/9 Scores 03/03/2020 02/09/2020 01/31/2020 11/30/2019  PHQ - 2 Score 0 0 0 0  PHQ- 9 Score 0 - 0 0    GAD 7 : Generalized Anxiety Score 03/03/2020 11/30/2019 08/20/2019 06/11/2019  Nervous, Anxious, on Edge 0 0 0 0  Control/stop worrying 0 0 0 0  Worry too much - different things 0 0 0 0  Trouble relaxing 0 0 0 0  Restless 0 0 0 0  Easily annoyed or irritable 0 0 0 0  Afraid - awful might happen 0 0 0 0  Total GAD 7 Score 0 0 0 0  Anxiety Difficulty - - - Not difficult at all    BP Readings from Last 3 Encounters:  07/31/20 130/64  04/27/20 (!) 148/85  04/10/20 130/80    Physical Exam Vitals and nursing note reviewed.  Constitutional:      Appearance: She is well-developed.  HENT:     Head: Normocephalic.     Jaw: There is normal jaw occlusion.     Right Ear: Tympanic membrane, ear canal and external ear normal. There is no impacted cerumen.     Left Ear: Tympanic membrane, ear canal and external ear normal. There is no impacted cerumen.     Nose:     Right Turbinates: Swollen.      Left Turbinates: Swollen.     Right Sinus: Maxillary sinus tenderness present. No frontal sinus tenderness.     Left Sinus: Maxillary sinus tenderness present. No frontal sinus tenderness.     Mouth/Throat:     Mouth: Mucous membranes are moist.  Eyes:     General: Lids are everted, no foreign bodies appreciated. No scleral icterus.       Right eye: No discharge.        Left eye: No foreign body, discharge or hordeolum.     Conjunctiva/sclera: Conjunctivae normal.     Right  eye: Right conjunctiva is not injected.     Left eye: Left conjunctiva is not injected.     Pupils: Pupils are equal, round, and reactive to light.  Neck:     Thyroid: No thyromegaly.     Vascular: No JVD.     Trachea: No tracheal deviation.  Cardiovascular:     Rate and Rhythm: Normal rate and regular rhythm.     Heart sounds: S1 normal. Murmur heard.  Systolic murmur is present with a grade of 2/6.  No diastolic murmur is present.    No friction rub. No gallop. No S3 or S4 sounds.  Pulmonary:     Effort: Pulmonary effort is normal. No respiratory distress.     Breath sounds: Normal breath sounds. No wheezing or rales.  Abdominal:     General: Bowel sounds are normal.     Palpations: Abdomen is soft. There is no mass.     Tenderness: There is no abdominal tenderness. There is no guarding or rebound.  Musculoskeletal:        General: No tenderness. Normal range of motion.     Cervical back: Normal range of motion and neck supple.  Lymphadenopathy:     Cervical: No cervical adenopathy.  Skin:    General: Skin is warm.     Findings: No rash.  Neurological:     Mental Status: She is alert and oriented to person, place, and time.     Cranial Nerves: No cranial nerve deficit.     Deep Tendon Reflexes: Reflexes normal.  Psychiatric:        Mood and Affect: Mood is not anxious or depressed.    Wt Readings from Last 3 Encounters:  10/11/20 148 lb (67.1 kg)  07/31/20 148 lb (67.1 kg)  04/27/20 149 lb 14.6  oz (68 kg)    Ht 5\' 1"  (1.549 m)   Wt 148 lb (67.1 kg)   BMI 27.96 kg/m   Assessment and Plan: 1. Acute non-recurrent maxillary sinusitis Acute.  Persistent.  Waxing and wanes in intensity.  Patient has sinus pressure behind eyes and maxillary areas as well.  Exam and history is consistent with acute sinusitis and we will treat with azithromycin to 50 mg 2 today followed by 1 a day for 4 days.  Patient has been encouraged to continue Flonase nasal lavage and Mucinex DM.  Patient is also been encouraged to obtain Sudafed 30 mg and take as directed. - azithromycin (ZITHROMAX) 250 MG tablet; Take 2 tablets on day 1, then 1 tablet daily on days 2 through 5  Dispense: 6 tablet; Refill: 0

## 2020-12-29 DIAGNOSIS — M5412 Radiculopathy, cervical region: Secondary | ICD-10-CM | POA: Diagnosis not present

## 2020-12-29 DIAGNOSIS — M4802 Spinal stenosis, cervical region: Secondary | ICD-10-CM | POA: Diagnosis not present

## 2021-01-02 DIAGNOSIS — H04123 Dry eye syndrome of bilateral lacrimal glands: Secondary | ICD-10-CM | POA: Diagnosis not present

## 2021-01-16 ENCOUNTER — Other Ambulatory Visit: Payer: Self-pay | Admitting: Family Medicine

## 2021-01-16 DIAGNOSIS — Z1231 Encounter for screening mammogram for malignant neoplasm of breast: Secondary | ICD-10-CM

## 2021-01-29 ENCOUNTER — Other Ambulatory Visit: Payer: Self-pay

## 2021-01-29 ENCOUNTER — Ambulatory Visit
Admission: RE | Admit: 2021-01-29 | Discharge: 2021-01-29 | Disposition: A | Discharge status: Home / Self Care | Payer: Medicare HMO | Source: Ambulatory Visit | Attending: Family Medicine | Admitting: Family Medicine

## 2021-01-29 DIAGNOSIS — Z1231 Encounter for screening mammogram for malignant neoplasm of breast: Secondary | ICD-10-CM

## 2021-01-31 ENCOUNTER — Encounter: Payer: Self-pay | Admitting: Family Medicine

## 2021-01-31 ENCOUNTER — Other Ambulatory Visit: Payer: Self-pay

## 2021-01-31 ENCOUNTER — Ambulatory Visit (INDEPENDENT_AMBULATORY_CARE_PROVIDER_SITE_OTHER): Payer: Medicare HMO | Admitting: Family Medicine

## 2021-01-31 VITALS — BP 130/80 | HR 72 | Ht 61.0 in | Wt 147.0 lb

## 2021-01-31 DIAGNOSIS — I10 Essential (primary) hypertension: Secondary | ICD-10-CM

## 2021-01-31 DIAGNOSIS — K219 Gastro-esophageal reflux disease without esophagitis: Secondary | ICD-10-CM

## 2021-01-31 DIAGNOSIS — E782 Mixed hyperlipidemia: Secondary | ICD-10-CM | POA: Diagnosis not present

## 2021-01-31 MED ORDER — LOSARTAN POTASSIUM-HCTZ 50-12.5 MG PO TABS: 1.0000 | ORAL_TABLET | Freq: Every day | ORAL | 1 refills | Status: DC

## 2021-01-31 MED ORDER — ATORVASTATIN CALCIUM 10 MG PO TABS: 10.0000 mg | ORAL_TABLET | Freq: Every day | ORAL | 1 refills | Status: DC

## 2021-01-31 MED ORDER — PANTOPRAZOLE SODIUM 40 MG PO TBEC: 40.0000 mg | DELAYED_RELEASE_TABLET | Freq: Every day | ORAL | 1 refills | Status: DC

## 2021-01-31 NOTE — Progress Notes (Signed)
Date:  01/31/2021   Name:  Emily Boyer   DOB:  [birth year]   MRN:  [id number]   Chief Complaint: Gastroesophageal Reflux, Hypertension, and Hyperlipidemia  Gastroesophageal Reflux She reports no abdominal pain, no chest pain, no coughing, no dysphagia, no heartburn, no nausea or no sore throat. This is a chronic problem. The current episode started more than 1 year ago. The problem occurs rarely. The problem has been gradually improving. The symptoms are aggravated by certain foods (onions). She has tried a PPI for the symptoms. The treatment provided moderate relief.  Hypertension This is a chronic problem. The current episode started more than 1 year ago. The problem has been gradually improving since onset. The problem is controlled. Associated symptoms include neck pain. Pertinent negatives include no anxiety, blurred vision, chest pain, headaches, malaise/fatigue, orthopnea, palpitations, peripheral edema, PND, shortness of breath or sweats. Risk factors for coronary artery disease include dyslipidemia. Past treatments include angiotensin blockers and diuretics. The current treatment provides moderate improvement. There are no compliance problems.  There is no history of angina, CAD/MI, CVA or heart failure. There is no history of chronic renal disease, a hypertension causing med or renovascular disease.  Hyperlipidemia This is a chronic problem. The problem is controlled. Recent lipid tests were reviewed and are normal. She has no history of chronic renal disease or diabetes. Pertinent negatives include no chest pain, myalgias or shortness of breath. Current antihyperlipidemic treatment includes statins. The current treatment provides moderate improvement of lipids.   Lab Results  Component Value Date   NA 141 02/01/2020   K 4.4 02/01/2020   CO2 26 02/01/2020   GLUCOSE 92 02/01/2020   BUN 16 02/01/2020   CREATININE 0.95 02/01/2020   CALCIUM 9.5 02/01/2020   GFRNONAA 60 02/01/2020    Lab Results  Component Value Date   CHOL 193 02/01/2020   HDL 40 02/01/2020   LDLCALC 115 (H) 02/01/2020   TRIG 214 (H) 02/01/2020   CHOLHDL 5.3 (H) 12/04/2016   Lab Results  Component Value Date   TSH 1.240 08/20/2019   No results found for: HGBA1C Lab Results  Component Value Date   WBC 11.0 (H) 01/12/2019   HGB 13.9 01/12/2019   HCT 41.3 01/12/2019   MCV 88 01/12/2019   PLT 335 01/12/2019   Lab Results  Component Value Date   ALT 17 08/20/2019   AST 19 08/20/2019   ALKPHOS 80 08/20/2019   BILITOT 0.3 08/20/2019   No results found for: 25OHVITD2, 25OHVITD3, VD25OH   Review of Systems  Constitutional:  Negative for chills, fever and malaise/fatigue.  HENT:  Negative for drooling, ear discharge, ear pain and sore throat.   Eyes:  Negative for blurred vision.  Respiratory:  Negative for cough and shortness of breath.   Cardiovascular:  Negative for chest pain, palpitations, orthopnea, leg swelling and PND.  Gastrointestinal:  Negative for abdominal pain, blood in stool, constipation, diarrhea, dysphagia, heartburn and nausea.  Endocrine: Negative for polydipsia.  Genitourinary:  Negative for dysuria, frequency, hematuria and urgency.  Musculoskeletal:  Positive for neck pain. Negative for myalgias.  Skin:  Negative for rash.  Allergic/Immunologic: Negative for environmental allergies.  Neurological:  Negative for dizziness and headaches.  Hematological:  Does not bruise/bleed easily.  Psychiatric/Behavioral:  Negative for suicidal ideas. The patient is not nervous/anxious.    Patient Active Problem List   Diagnosis Date Noted   H/O total hysterectomy 03/03/2020   DDD (degenerative disc disease), cervical 05/28/2018  Sebaceous cyst 12/20/2015   Abnormal findings-gastrointestinal tract    Encounter for general adult medical examination without abnormal findings 07/04/2014   Nerve root pain 07/04/2014   Gastroesophageal reflux disease 07/04/2014   HLD  (hyperlipidemia) 07/04/2014   Hypertension 07/04/2014    Allergies  Allergen Reactions   Guaifenesin & Derivatives    Methocarbamol Itching   Penicillins Itching   Tramadol Hcl Itching    Past Surgical History:  Procedure Laterality Date   BREAST BIOPSY Right 2004   neg   CATARACT EXTRACTION W/PHACO Right 04/29/2017   Procedure: CATARACT EXTRACTION PHACO AND INTRAOCULAR LENS PLACEMENT ([place]) RIGHT;  Surgeon: Eulogio Bear, MD;  Location: [facility];  Service: Ophthalmology;  Laterality: Right;   CATARACT EXTRACTION W/PHACO Left 07/15/2017   Procedure: CATARACT EXTRACTION PHACO AND INTRAOCULAR LENS PLACEMENT ([place]) LEFT;  Surgeon: Eulogio Bear, MD;  Location: [facility];  Service: Ophthalmology;  Laterality: Left;   COLONOSCOPY WITH PROPOFOL N/A 02/06/2015   Procedure: COLONOSCOPY WITH PROPOFOL;  Surgeon: [name], MD;  Location: [facility];  Service: Endoscopy;  Laterality: N/A;   EYE SURGERY  2019   TUBAL LIGATION  1980s   VAGINAL HYSTERECTOMY  1990s   one ovary removed as well    Social History   Tobacco Use   Smoking status: Former    Packs/day: 0.50    Years: 50.00    Pack years: 25.00    Types: Cigarettes, E-cigarettes    Quit date: 11/22/2011    Years since quitting: 9.2   Smokeless tobacco: Never   Tobacco comments:    quit 2014  Vaping Use   Vaping Use: Never used  Substance Use Topics   Alcohol use: Yes    Alcohol/week: 2.0 standard drinks    Types: 2 Glasses of wine per week   Drug use: No     Medication list has been reviewed and updated.  Current Meds  Medication Sig   acetaminophen (TYLENOL) 500 MG tablet Take 500 mg by mouth every 6 (six) hours as needed.   aspirin EC 81 MG tablet Take by mouth.   atorvastatin (LIPITOR) 10 MG tablet Take 1 tablet (10 mg total) by mouth daily.   cholecalciferol (VITAMIN D) 1000 units tablet Take 1,000 Units by mouth daily.   losartan-hydrochlorothiazide (HYZAAR) 50-12.5 MG  tablet Take 1 tablet by mouth daily.   Multiple Vitamins-Minerals (CENTRUM SILVER 50+WOMEN) TABS Take by mouth.   Omega 3 1000 MG CAPS Take 1 capsule by mouth daily.   pantoprazole (PROTONIX) 40 MG tablet Take 1 tablet (40 mg total) by mouth daily.   tiZANidine (ZANAFLEX) 4 MG capsule Take 1 capsule (4 mg total) by mouth 3 (three) times daily.   triamcinolone (KENALOG) 0.1 % Apply 1 application topically 2 (two) times daily.   Zinc Oxide-Vitamin C (ZINC PLUS VITAMIN C PO) Take 1 tablet by mouth daily.    PHQ 2/9 Scores 01/31/2021 10/11/2020 03/03/2020 02/09/2020  PHQ - 2 Score 0 0 0 0  PHQ- 9 Score 0 0 0 -    GAD 7 : Generalized Anxiety Score 01/31/2021 10/11/2020 03/03/2020 11/30/2019  Nervous, Anxious, on Edge 0 0 0 0  Control/stop worrying 0 0 0 0  Worry too much - different things 0 0 0 0  Trouble relaxing 0 0 0 0  Restless 0 0 0 0  Easily annoyed or irritable 0 0 0 0  Afraid - awful might happen 0 0 0 0  Total GAD 7 Score 0  0 0 0  Anxiety Difficulty Not difficult at all - - -    BP Readings from Last 3 Encounters:  01/31/21 130/80  10/11/20 122/70  07/31/20 130/64    Physical Exam Vitals and nursing note reviewed.  Constitutional:      Appearance: She is well-developed.  HENT:     Head: Normocephalic.     Right Ear: External ear normal.     Left Ear: External ear normal.  Eyes:     General: Lids are everted, no foreign bodies appreciated. No scleral icterus.       Left eye: No foreign body or hordeolum.     Conjunctiva/sclera: Conjunctivae normal.     Right eye: Right conjunctiva is not injected.     Left eye: Left conjunctiva is not injected.     Pupils: Pupils are equal, round, and reactive to light.  Neck:     Thyroid: No thyromegaly.     Vascular: No JVD.     Trachea: No tracheal deviation.  Cardiovascular:     Rate and Rhythm: Normal rate and regular rhythm.     Heart sounds: Normal heart sounds. No murmur heard.   No friction rub. No gallop.  Pulmonary:      Effort: Pulmonary effort is normal. No respiratory distress.     Breath sounds: Normal breath sounds. No wheezing or rales.  Abdominal:     General: Bowel sounds are normal.     Palpations: Abdomen is soft. There is no mass.     Tenderness: There is no abdominal tenderness. There is no guarding or rebound.  Musculoskeletal:        General: No tenderness. Normal range of motion.     Cervical back: Normal range of motion and neck supple.  Lymphadenopathy:     Cervical: No cervical adenopathy.  Skin:    General: Skin is warm.     Findings: No rash.  Neurological:     Mental Status: She is alert and oriented to person, place, and time.     Cranial Nerves: No cranial nerve deficit.     Deep Tendon Reflexes: Reflexes normal.  Psychiatric:        Mood and Affect: Mood is not anxious or depressed.    Wt Readings from Last 3 Encounters:  01/31/21 147 lb (66.7 kg)  10/11/20 148 lb (67.1 kg)  07/31/20 148 lb (67.1 kg)    BP 130/80    Pulse 72    Ht 5\' 1"  (1.549 m)    Wt 147 lb (66.7 kg)    BMI 27.78 kg/m   Assessment and Plan:  1. Essential hypertension Chronic.  Controlled.  Stable.  Blood pressure today is 130/80.  Continue losartan hydrochlorothiazide 50-12.5 mg once a day.  Will check CMP for electrolytes and GFR.  We will recheck patient in 6 months. - losartan-hydrochlorothiazide (HYZAAR) 50-12.5 MG tablet; Take 1 tablet by mouth daily.  Dispense: 90 tablet; Refill: 1 - Comprehensive Metabolic Panel (CMET)  2. Mixed hyperlipidemia Chronic.  Controlled.  Stable.  Continue atorvastatin 10 mg once a day.  Will check lipid panel for current level of LDL management. - atorvastatin (LIPITOR) 10 MG tablet; Take 1 tablet (10 mg total) by mouth daily.  Dispense: 90 tablet; Refill: 1 - Lipid Panel With LDL/HDL Ratio  3. Gastroesophageal reflux disease, unspecified whether esophagitis present Chronic.  Controlled.  Stable.  Patient has no problems except with onions upon taking  pantoprazole 40 mg once a day.  Would like  to continue with current regimen. - pantoprazole (PROTONIX) 40 MG tablet; Take 1 tablet (40 mg total) by mouth daily.  Dispense: 90 tablet; Refill: 1

## 2021-02-01 LAB — COMPREHENSIVE METABOLIC PANEL
ALT: 18 IU/L (ref 0–32)
AST: 24 IU/L (ref 0–40)
Albumin/Globulin Ratio: 1.6 (ref 1.2–2.2)
Albumin: 4.5 g/dL (ref 3.7–4.7)
Alkaline Phosphatase: 94 IU/L (ref 44–121)
BUN/Creatinine Ratio: 17 (ref 12–28)
BUN: 15 mg/dL (ref 8–27)
Bilirubin Total: 0.3 mg/dL (ref 0.0–1.2)
CO2: 27 mmol/L (ref 20–29)
Calcium: 10.1 mg/dL (ref 8.7–10.3)
Chloride: 97 mmol/L (ref 96–106)
Creatinine, Ser: 0.88 mg/dL (ref 0.57–1.00)
Globulin, Total: 2.9 g/dL (ref 1.5–4.5)
Glucose: 97 mg/dL (ref 70–99)
Potassium: 4.8 mmol/L (ref 3.5–5.2)
Sodium: 137 mmol/L (ref 134–144)
Total Protein: 7.4 g/dL (ref 6.0–8.5)
eGFR: 69 mL/min/{1.73_m2} (ref >59)

## 2021-02-01 LAB — LIPID PANEL WITH LDL/HDL RATIO
Cholesterol, Total: 177 mg/dL (ref 100–199)
HDL: 47 mg/dL (ref >39)
LDL Chol Calc (NIH): 105 mg/dL — ABNORMAL HIGH (ref 0–99)
LDL/HDL Ratio: 2.2 ratio (ref 0.0–3.2)
Triglycerides: 140 mg/dL (ref 0–149)
VLDL Cholesterol Cal: 25 mg/dL (ref 5–40)

## 2021-02-06 ENCOUNTER — Ambulatory Visit (INDEPENDENT_AMBULATORY_CARE_PROVIDER_SITE_OTHER): Payer: Medicare HMO | Admitting: Family Medicine

## 2021-02-06 ENCOUNTER — Other Ambulatory Visit: Payer: Self-pay

## 2021-02-06 ENCOUNTER — Encounter: Payer: Self-pay | Admitting: Family Medicine

## 2021-02-06 VITALS — BP 122/66 | HR 88 | Temp 98.5°F | Ht 61.0 in | Wt 145.0 lb

## 2021-02-06 DIAGNOSIS — R051 Acute cough: Secondary | ICD-10-CM

## 2021-02-06 DIAGNOSIS — J0100 Acute maxillary sinusitis, unspecified: Secondary | ICD-10-CM

## 2021-02-06 MED ORDER — AZITHROMYCIN 250 MG PO TABS: ORAL_TABLET | ORAL | 0 refills | Status: AC

## 2021-02-06 NOTE — Progress Notes (Addendum)
Date:  02/06/2021   Name:  Aniyiah Boyer   DOB:  06-22-1945   MRN:  [id number]   Chief Complaint: Sinusitis (Runny nose, sneezing, cough- thick, yellow phlegm, headache)  Sinusitis This is a new problem. The current episode started in the past 7 days. The problem has been waxing and waning since onset. There has been no fever. The pain is mild. Associated symptoms include congestion, coughing, diaphoresis, headaches, sinus pressure, sneezing and a sore throat. Pertinent negatives include no chills, ear pain, hoarse voice, neck pain, shortness of breath or swollen glands. Past treatments include acetaminophen (mucinnex dm). The treatment provided mild relief.   Lab Results  Component Value Date   NA 137 01/31/2021   K 4.8 01/31/2021   CO2 27 01/31/2021   GLUCOSE 97 01/31/2021   BUN 15 01/31/2021   CREATININE 0.88 01/31/2021   CALCIUM 10.1 01/31/2021   EGFR 69 01/31/2021   GFRNONAA 60 02/01/2020   Lab Results  Component Value Date   CHOL 177 01/31/2021   HDL 47 01/31/2021   LDLCALC 105 (H) 01/31/2021   TRIG 140 01/31/2021   CHOLHDL 5.3 (H) 12/04/2016   Lab Results  Component Value Date   TSH 1.240 08/20/2019   No results found for: HGBA1C Lab Results  Component Value Date   WBC 11.0 (H) 01/12/2019   HGB 13.9 01/12/2019   HCT 41.3 01/12/2019   MCV 88 01/12/2019   PLT 335 01/12/2019   Lab Results  Component Value Date   ALT 18 01/31/2021   AST 24 01/31/2021   ALKPHOS 94 01/31/2021   BILITOT 0.3 01/31/2021   No results found for: 25OHVITD2, 25OHVITD3, VD25OH   Review of Systems  Constitutional:  Positive for diaphoresis. Negative for chills.  HENT:  Positive for congestion, sinus pressure, sneezing and sore throat. Negative for ear pain and hoarse voice.   Respiratory:  Positive for cough. Negative for shortness of breath.   Musculoskeletal:  Negative for neck pain.  Neurological:  Positive for headaches.   Patient Active Problem List   Diagnosis Date  Noted   H/O total hysterectomy 03/03/2020   DDD (degenerative disc disease), cervical 05/28/2018   Sebaceous cyst 12/20/2015   Abnormal findings-gastrointestinal tract    Encounter for general adult medical examination without abnormal findings 07/04/2014   Nerve root pain 07/04/2014   Gastroesophageal reflux disease 07/04/2014   HLD (hyperlipidemia) 07/04/2014   Hypertension 07/04/2014    Allergies  Allergen Reactions   Guaifenesin & Derivatives    Methocarbamol Itching   Penicillins Itching   Tramadol Hcl Itching    Past Surgical History:  Procedure Laterality Date   BREAST BIOPSY Right 2004   neg   CATARACT EXTRACTION W/PHACO Right 04/29/2017   Procedure: CATARACT EXTRACTION PHACO AND INTRAOCULAR LENS PLACEMENT ([place]) RIGHT;  Surgeon: Eulogio Bear, MD;  Location: [facility];  Service: Ophthalmology;  Laterality: Right;   CATARACT EXTRACTION W/PHACO Left 07/15/2017   Procedure: CATARACT EXTRACTION PHACO AND INTRAOCULAR LENS PLACEMENT ([place]) LEFT;  Surgeon: Eulogio Bear, MD;  Location: [facility];  Service: Ophthalmology;  Laterality: Left;   COLONOSCOPY WITH PROPOFOL N/A 02/06/2015   Procedure: COLONOSCOPY WITH PROPOFOL;  Surgeon: [name], MD;  Location: [place];  Service: Endoscopy;  Laterality: N/A;   EYE SURGERY  2019   TUBAL LIGATION  1980s   VAGINAL HYSTERECTOMY  1990s   one ovary removed as well    Social History   Tobacco Use   Smoking  status: Former    Packs/day: 0.50    Years: 50.00    Pack years: 25.00    Types: Cigarettes, E-cigarettes    Quit date: 11/22/2011    Years since quitting: 9.2   Smokeless tobacco: Never   Tobacco comments:    quit 2014  Vaping Use   Vaping Use: Never used  Substance Use Topics   Alcohol use: Yes    Alcohol/week: 2.0 standard drinks    Types: 2 Glasses of wine per week   Drug use: No     Medication list has been reviewed and updated.  Current Meds  Medication Sig    acetaminophen (TYLENOL) 500 MG tablet Take 500 mg by mouth every 6 (six) hours as needed.   aspirin EC 81 MG tablet Take by mouth.   atorvastatin (LIPITOR) 10 MG tablet Take 1 tablet (10 mg total) by mouth daily.   cholecalciferol (VITAMIN D) 1000 units tablet Take 1,000 Units by mouth daily.   losartan-hydrochlorothiazide (HYZAAR) 50-12.5 MG tablet Take 1 tablet by mouth daily.   Multiple Vitamins-Minerals (CENTRUM SILVER 50+WOMEN) TABS Take by mouth.   Omega 3 1000 MG CAPS Take 1 capsule by mouth daily.   pantoprazole (PROTONIX) 40 MG tablet Take 1 tablet (40 mg total) by mouth daily.   tiZANidine (ZANAFLEX) 4 MG capsule Take 1 capsule (4 mg total) by mouth 3 (three) times daily.   triamcinolone (KENALOG) 0.1 % Apply 1 application topically 2 (two) times daily.   Zinc Oxide-Vitamin C (ZINC PLUS VITAMIN C PO) Take 1 tablet by mouth daily.    PHQ 2/9 Scores 01/31/2021 10/11/2020 03/03/2020 02/09/2020  PHQ - 2 Score 0 0 0 0  PHQ- 9 Score 0 0 0 -    GAD 7 : Generalized Anxiety Score 01/31/2021 10/11/2020 03/03/2020 11/30/2019  Nervous, Anxious, on Edge 0 0 0 0  Control/stop worrying 0 0 0 0  Worry too much - different things 0 0 0 0  Trouble relaxing 0 0 0 0  Restless 0 0 0 0  Easily annoyed or irritable 0 0 0 0  Afraid - awful might happen 0 0 0 0  Total GAD 7 Score 0 0 0 0  Anxiety Difficulty Not difficult at all - - -    BP Readings from Last 3 Encounters:  02/06/21 122/66  01/31/21 130/80  10/11/20 122/70    Physical Exam Vitals and nursing note reviewed.  Constitutional:      Appearance: She is well-developed.  HENT:     Head: Normocephalic.     Right Ear: Tympanic membrane, ear canal and external ear normal.     Left Ear: Tympanic membrane, ear canal and external ear normal.     Nose: Congestion and rhinorrhea present.     Right Sinus: Maxillary sinus tenderness present. No frontal sinus tenderness.     Left Sinus: Maxillary sinus tenderness and frontal sinus tenderness  present.     Mouth/Throat:     Pharynx: Oropharynx is clear. No posterior oropharyngeal erythema or uvula swelling.  Eyes:     General: Lids are everted, no foreign bodies appreciated. No scleral icterus.       Left eye: No foreign body or hordeolum.     Conjunctiva/sclera: Conjunctivae normal.     Right eye: Right conjunctiva is not injected.     Left eye: Left conjunctiva is not injected.     Pupils: Pupils are equal, round, and reactive to light.  Neck:     Thyroid: No  thyromegaly.     Vascular: No JVD.     Trachea: No tracheal deviation.  Cardiovascular:     Rate and Rhythm: Normal rate and regular rhythm.     Heart sounds: Normal heart sounds, S1 normal and S2 normal. No murmur heard. No systolic murmur is present.  No diastolic murmur is present.    No friction rub. No gallop. No S3 or S4 sounds.  Pulmonary:     Effort: Pulmonary effort is normal. No respiratory distress.     Breath sounds: Normal breath sounds. No stridor. No decreased breath sounds, wheezing, rhonchi or rales.  Abdominal:     General: Bowel sounds are normal.     Palpations: Abdomen is soft. There is no mass.     Tenderness: There is no abdominal tenderness. There is no guarding or rebound.  Musculoskeletal:        General: No tenderness. Normal range of motion.     Cervical back: Normal range of motion and neck supple.  Lymphadenopathy:     Cervical: No cervical adenopathy.  Skin:    General: Skin is warm.     Findings: No rash.  Neurological:     Mental Status: She is alert and oriented to person, place, and time.     Cranial Nerves: No cranial nerve deficit.     Deep Tendon Reflexes: Reflexes normal.  Psychiatric:        Mood and Affect: Mood is not anxious or depressed.    Wt Readings from Last 3 Encounters:  02/06/21 145 lb (65.8 kg)  01/31/21 147 lb (66.7 kg)  10/11/20 148 lb (67.1 kg)    BP 122/66    Pulse 88    Temp 98.5 F (36.9 C) (Oral)    Ht '5\' 1"'  (1.549 m)    Wt 145 lb (65.8 kg)     BMI 27.40 kg/m   Assessment and Plan:  1. Acute non-recurrent maxillary sinusitis New onset.  Persistent.  Stable.  48 hours of sinus congestion that is purulent.  We will treat with azithromycin to 50 mg 2 today follow-up with 1 a day for 4 days.  We will recheck on an as-needed basis. - azithromycin (ZITHROMAX) 250 MG tablet; Take 2 tablets on day 1, then 1 tablet daily on days 2 through 5  Dispense: 6 tablet; Refill: 0  2. Acute cough Patient with mild cough which we will control with over-the-counter Mucinex DM.

## 2021-02-12 ENCOUNTER — Ambulatory Visit (INDEPENDENT_AMBULATORY_CARE_PROVIDER_SITE_OTHER): Payer: Medicare HMO

## 2021-02-12 DIAGNOSIS — Z0000 Encounter for general adult medical examination without abnormal findings: Secondary | ICD-10-CM

## 2021-02-12 NOTE — Patient Instructions (Signed)
[name] , Thank you for taking time to come for your Medicare Wellness Visit. I appreciate your ongoing commitment to your health goals. Please review the following plan we discussed and let me know if I can assist you in the future.   Screening recommendations/referrals: Colonoscopy: done 04/03/17 Mammogram: done 01/29/21 Bone Density: done 01/01/18 Recommended yearly ophthalmology/optometry visit for glaucoma screening and checkup Recommended yearly dental visit for hygiene and checkup  Vaccinations: Influenza vaccine: done 01/10/21 Pneumococcal vaccine: done 12/13/15 Tdap vaccine: done 12/07/15 Shingles vaccine: Shingrix discussed. Please contact your pharmacy for coverage information.  Covid-19: done 02/26/19, 03/26/19, 11/27/19, 07/10/20 & 12/16/20  Advanced directives: Advance directive discussed with you today. I have provided a copy for you to complete at home and have notarized. Once this is complete please bring a copy in to our office so we can scan it into your chart.   Conditions/risks identified: Recommend increasing physical activity   Next appointment: Follow up in one year for your annual wellness visit    Preventive Care 65 Years and Older, Female Preventive care refers to lifestyle choices and visits with your health care provider that can promote health and wellness. What does preventive care include? A yearly physical exam. This is also called an annual well check. Dental exams once or twice a year. Routine eye exams. Ask your health care provider how often you should have your eyes checked. Personal lifestyle choices, including: Daily care of your teeth and gums. Regular physical activity. Eating a healthy diet. Avoiding tobacco and drug use. Limiting alcohol use. Practicing safe sex. Taking low-dose aspirin every day. Taking vitamin and mineral supplements as recommended by your health care provider. What happens during an annual well check? The services and  screenings done by your health care provider during your annual well check will depend on your age, overall health, lifestyle risk factors, and family history of disease. Counseling  Your health care provider may ask you questions about your: Alcohol use. Tobacco use. Drug use. Emotional well-being. Home and relationship well-being. Sexual activity. Eating habits. History of falls. Memory and ability to understand (cognition). Work and work Statistician. Reproductive health. Screening  You may have the following tests or measurements: Height, weight, and BMI. Blood pressure. Lipid and cholesterol levels. These may be checked every 5 years, or more frequently if you are over 69 years old. Skin check. Lung cancer screening. You may have this screening every year starting at age 49 if you have a 30-pack-year history of smoking and currently smoke or have quit within the past 15 years. Fecal occult blood test (FOBT) of the stool. You may have this test every year starting at age 66. Flexible sigmoidoscopy or colonoscopy. You may have a sigmoidoscopy every 5 years or a colonoscopy every 10 years starting at age 27. Hepatitis C blood test. Hepatitis B blood test. Sexually transmitted disease (STD) testing. Diabetes screening. This is done by checking your blood sugar (glucose) after you have not eaten for a while (fasting). You may have this done every 1-3 years. Bone density scan. This is done to screen for osteoporosis. You may have this done starting at age 46. Mammogram. This may be done every 1-2 years. Talk to your health care provider about how often you should have regular mammograms. Talk with your health care provider about your test results, treatment options, and if necessary, the need for more tests. Vaccines  Your health care provider may recommend certain vaccines, such as: Influenza vaccine. This is  recommended every year. Tetanus, diphtheria, and acellular pertussis (Tdap,  Td) vaccine. You may need a Td booster every 10 years. Zoster vaccine. You may need this after age 16. Pneumococcal 13-valent conjugate (PCV13) vaccine. One dose is recommended after age 47. Pneumococcal polysaccharide (PPSV23) vaccine. One dose is recommended after age 54. Talk to your health care provider about which screenings and vaccines you need and how often you need them. This information is not intended to replace advice given to you by your health care provider. Make sure you discuss any questions you have with your health care provider. Document Released: 02/03/2015 Document Revised: 09/27/2015 Document Reviewed: 11/08/2014 Elsevier Interactive Patient Education  2017 [employer] Prevention in the Home Falls can cause injuries. They can happen to people of all ages. There are many things you can do to make your home safe and to help prevent falls. What can I do on the outside of my home? Regularly fix the edges of walkways and driveways and fix any cracks. Remove anything that might make you trip as you walk through a door, such as a raised step or threshold. Trim any bushes or trees on the path to your home. Use bright outdoor lighting. Clear any walking paths of anything that might make someone trip, such as rocks or tools. Regularly check to see if handrails are loose or broken. Make sure that both sides of any steps have handrails. Any raised decks and porches should have guardrails on the edges. Have any leaves, snow, or ice cleared regularly. Use sand or salt on walking paths during winter. Clean up any spills in your garage right away. This includes oil or grease spills. What can I do in the bathroom? Use night lights. Install grab bars by the toilet and in the tub and shower. Do not use towel bars as grab bars. Use non-skid mats or decals in the tub or shower. If you need to sit down in the shower, use a plastic, non-slip stool. Keep the floor dry. Clean up any  water that spills on the floor as soon as it happens. Remove soap buildup in the tub or shower regularly. Attach bath mats securely with double-sided non-slip rug tape. Do not have throw rugs and other things on the floor that can make you trip. What can I do in the bedroom? Use night lights. Make sure that you have a light by your bed that is easy to reach. Do not use any sheets or blankets that are too big for your bed. They should not hang down onto the floor. Have a firm chair that has side arms. You can use this for support while you get dressed. Do not have throw rugs and other things on the floor that can make you trip. What can I do in the kitchen? Clean up any spills right away. Avoid walking on wet floors. Keep items that you use a lot in easy-to-reach places. If you need to reach something above you, use a strong step stool that has a grab bar. Keep electrical cords out of the way. Do not use floor polish or wax that makes floors slippery. If you must use wax, use non-skid floor wax. Do not have throw rugs and other things on the floor that can make you trip. What can I do with my stairs? Do not leave any items on the stairs. Make sure that there are handrails on both sides of the stairs and use them. Fix handrails that are  broken or loose. Make sure that handrails are as long as the stairways. Check any carpeting to make sure that it is firmly attached to the stairs. Fix any carpet that is loose or worn. Avoid having throw rugs at the top or bottom of the stairs. If you do have throw rugs, attach them to the floor with carpet tape. Make sure that you have a light switch at the top of the stairs and the bottom of the stairs. If you do not have them, ask someone to add them for you. What else can I do to help prevent falls? Wear shoes that: Do not have high heels. Have rubber bottoms. Are comfortable and fit you well. Are closed at the toe. Do not wear sandals. If you use a  stepladder: Make sure that it is fully opened. Do not climb a closed stepladder. Make sure that both sides of the stepladder are locked into place. Ask someone to hold it for you, if possible. Clearly mark and make sure that you can see: Any grab bars or handrails. First and last steps. Where the edge of each step is. Use tools that help you move around (mobility aids) if they are needed. These include: Canes. Walkers. Scooters. Crutches. Turn on the lights when you go into a dark area. Replace any light bulbs as soon as they burn out. Set up your furniture so you have a clear path. Avoid moving your furniture around. If any of your floors are uneven, fix them. If there are any pets around you, be aware of where they are. Review your medicines with your doctor. Some medicines can make you feel dizzy. This can increase your chance of falling. Ask your doctor what other things that you can do to help prevent falls. This information is not intended to replace advice given to you by your health care provider. Make sure you discuss any questions you have with your health care provider. Document Released: 11/03/2008 Document Revised: 06/15/2015 Document Reviewed: 02/11/2014 Elsevier Interactive Patient Education  2017 [employer].

## 2021-02-12 NOTE — Progress Notes (Signed)
Subjective:   Emily Boyer is a 76 y.o. female who presents for Medicare Annual (Subsequent) preventive examination.  Virtual Visit via Telephone Note  I connected with  Emily Boyer on 02/12/21 at  3:20 PM EST by telephone and verified that I am speaking with the correct person using two identifiers.  Location: Patient: home Provider: [facility] Persons participating in the virtual visit: [place]   I discussed the limitations, risks, security and privacy concerns of performing an evaluation and management service by telephone and the availability of in person appointments. The patient expressed understanding and agreed to proceed.  Interactive audio and video telecommunications were attempted between this nurse and patient, however failed, due to patient having technical difficulties OR patient did not have access to video capability.  We continued and completed visit with audio only.  Some vital signs may be absent or patient reported.   Clemetine Marker, LPN   Review of Systems     Cardiac Risk Factors include: advanced age (>81men, >58 women);hypertension     Objective:    Today's Vitals   02/12/21 1544  PainSc: 3    There is no height or weight on file to calculate BMI.  Advanced Directives 02/12/2021 04/27/2020 02/09/2020 02/08/2019 01/29/2018 01/09/2018 07/15/2017  Does Patient Have a Medical Advance Directive? No No No No No No No  Would patient like information on creating a medical advance directive? Yes (MAU/Ambulatory/Procedural Areas - Information given) - No - Patient declined Yes (MAU/Ambulatory/Procedural Areas - Information given) - - Yes (MAU/Ambulatory/Procedural Areas - Information given)    Current Medications (verified) Outpatient Encounter Medications as of 02/12/2021  Medication Sig   acetaminophen (TYLENOL) 500 MG tablet Take 500 mg by mouth every 6 (six) hours as needed.   aspirin EC 81 MG tablet Take by mouth.   atorvastatin  (LIPITOR) 10 MG tablet Take 1 tablet (10 mg total) by mouth daily.   cholecalciferol (VITAMIN D) 1000 units tablet Take 1,000 Units by mouth daily.   losartan-hydrochlorothiazide (HYZAAR) 50-12.5 MG tablet Take 1 tablet by mouth daily.   Multiple Vitamins-Minerals (CENTRUM SILVER 50+WOMEN) TABS Take by mouth.   Omega 3 1000 MG CAPS Take 1 capsule by mouth daily.   pantoprazole (PROTONIX) 40 MG tablet Take 1 tablet (40 mg total) by mouth daily.   triamcinolone (KENALOG) 0.1 % Apply 1 application topically 2 (two) times daily.   Zinc Oxide-Vitamin C (ZINC PLUS VITAMIN C PO) Take 1 tablet by mouth daily.   tiZANidine (ZANAFLEX) 4 MG capsule Take 1 capsule (4 mg total) by mouth 3 (three) times daily. (Patient not taking: Reported on 02/12/2021)   No facility-administered encounter medications on file as of 02/12/2021.    Allergies (verified) Guaifenesin & derivatives, Methocarbamol, Penicillins, and Tramadol hcl   History: Past Medical History:  Diagnosis Date   Allergy Sinus   Bone spur    right side of neck   Colitis    DDD (degenerative disc disease), cervical    Diverticulitis    GERD (gastroesophageal reflux disease)    Heart murmur    followed by PCP   HLD (hyperlipidemia)    Hypertension    Seasonal allergies    Past Surgical History:  Procedure Laterality Date   BREAST BIOPSY Right 2004   neg   CATARACT EXTRACTION W/PHACO Right 04/29/2017   Procedure: CATARACT EXTRACTION PHACO AND INTRAOCULAR LENS PLACEMENT ([place]) RIGHT;  Surgeon: Eulogio Bear, MD;  Location: [place];  Service: Ophthalmology;  Laterality:  Right;   CATARACT EXTRACTION W/PHACO Left 07/15/2017   Procedure: CATARACT EXTRACTION PHACO AND INTRAOCULAR LENS PLACEMENT ([place]) LEFT;  Surgeon: Eulogio Bear, MD;  Location: [place];  Service: Ophthalmology;  Laterality: Left;   COLONOSCOPY WITH PROPOFOL N/A 02/06/2015   Procedure: COLONOSCOPY WITH PROPOFOL;  Surgeon: [name], MD;   Location: [place];  Service: Endoscopy;  Laterality: N/A;   EYE SURGERY  2019   TUBAL LIGATION  1980s   VAGINAL HYSTERECTOMY  1990s   one ovary removed as well   Family History  Problem Relation Age of Onset   Stroke Mother    Hypertension Mother    Arthritis Father    Heart disease Paternal Aunt    Cancer Paternal Grandmother    Stomach cancer Paternal Grandmother    Breast cancer Cousin        pat cousin   Social History   Socioeconomic History   Marital status: Divorced    Spouse name: Not on file   Number of children: 2   Years of education: Not on file   Highest education level: Not on file  Occupational History   Not on file  Tobacco Use   Smoking status: Former    Packs/day: 0.50    Years: 50.00    Pack years: 25.00    Types: Cigarettes, E-cigarettes    Quit date: 11/22/2011    Years since quitting: 9.2   Smokeless tobacco: Never   Tobacco comments:    quit 2014  Vaping Use   Vaping Use: Never used  Substance and Sexual Activity   Alcohol use: Yes    Alcohol/week: 2.0 standard drinks    Types: 2 Glasses of wine per week   Drug use: No   Sexual activity: Yes    Birth control/protection: None  Other Topics Concern   Not on file  Social History Narrative   Not on file   Social Determinants of Health   Financial Resource Strain: Low Risk    Difficulty of Paying Living Expenses: Not hard at all  Food Insecurity: No Food Insecurity   Worried About Charity fundraiser in the Last Year: Never true   [place] in the Last Year: Never true  Transportation Needs: No Transportation Needs   Lack of Transportation (Medical): No   Lack of Transportation (Non-Medical): No  Physical Activity: Inactive   Days of Exercise per Week: 0 days   Minutes of Exercise per Session: 0 min  Stress: No Stress Concern Present   Feeling of Stress : Not at all  Social Connections: Moderately Isolated   Frequency of Communication with Friends and Family: More  than three times a week   Frequency of Social Gatherings with Friends and Family: Once a week   Attends Religious Services: Never   Marine scientist or Organizations: Yes   Attends Music therapist: More than 4 times per year   Marital Status: Divorced    Tobacco Counseling Counseling given: Not Answered Tobacco comments: quit 2014   Clinical Intake:  Pre-visit preparation completed: Yes  Pain : 0-10 Pain Score: 3  Pain Type: Chronic pain Pain Location: Neck (right shoulder & arm) Pain Descriptors / Indicators: Aching, Sore Pain Onset: More than a month ago Pain Frequency: Constant     Nutritional Risks: None Diabetes: No  How often do you need to have someone help you when you read instructions, pamphlets, or other written materials from your doctor or  pharmacy?: 1 - Never    Interpreter Needed?: No  Information entered by :: Clemetine Marker LPN   Activities of Daily Living In your present state of health, do you have any difficulty performing the following activities: 02/12/2021  Hearing? N  Vision? N  Difficulty concentrating or making decisions? N  Walking or climbing stairs? N  Dressing or bathing? N  Doing errands, shopping? N  Preparing Food and eating ? N  Using the Toilet? N  In the past six months, have you accidently leaked urine? N  Do you have problems with loss of bowel control? N  Managing your Medications? N  Managing your Finances? N  Housekeeping or managing your Housekeeping? N  Some recent data might be hidden    Patient Care Team: Juline Patch, MD as PCP - General (Family Medicine)  Indicate any recent Medical Services you may have received from other than [facility] providers in the past year (date may be approximate).     Assessment:   This is a routine wellness examination for Emily Boyer.  Hearing/Vision screen Hearing Screening - Comments:: Pt denies hearing difficulty Vision Screening - Comments:: Annual vision  screenings with Dr. Edison Pace [facility]   Dietary issues and exercise activities discussed: Current Exercise Habits: The patient does not participate in regular exercise at present, Exercise limited by: orthopedic condition(s)   Goals Addressed   None    Depression Screen PHQ 2/9 Scores 02/12/2021 01/31/2021 10/11/2020 03/03/2020 02/09/2020 01/31/2020 11/30/2019  PHQ - 2 Score 0 0 0 0 0 0 0  PHQ- 9 Score - 0 0 0 - 0 0    Fall Risk Fall Risk  02/12/2021 02/09/2020 08/20/2019 06/11/2019 05/31/2019  Falls in the past year? 0 0 0 0 0  Number falls in past yr: 0 0 - 0 -  Injury with Fall? 0 0 - 0 -  Risk for fall due to : No Fall Risks No Fall Risks - No Fall Risks -  Follow up Falls prevention discussed Falls prevention discussed Falls evaluation completed Falls evaluation completed Falls evaluation completed    [place]:  Any stairs in or around the home? Yes  If so, are there any without handrails? No  Home free of loose throw rugs in walkways, pet beds, electrical cords, etc? Yes  Adequate lighting in your home to reduce risk of falls? Yes   ASSISTIVE DEVICES UTILIZED TO PREVENT FALLS:  Life alert? No  Use of a cane, walker or w/c? No  Grab bars in the bathroom? No  Shower chair or bench in shower? No  Elevated toilet seat or a handicapped toilet? No   TIMED UP AND GO:  Was the test performed? No . Telephonic visit.   Cognitive Function: Normal cognitive status assessed by direct observation by this Nurse Health Advisor. No abnormalities found.       6CIT Screen 02/08/2019  What Year? 0 points  What month? 0 points  What time? 0 points  Count back from 20 0 points  Months in reverse 0 points  Repeat phrase 2 points  Total Score 2    Immunizations Immunization History  Administered Date(s) Administered   Fluad Quad(high Dose 65+) 10/12/2018, 11/03/2019   Influenza, High Dose Seasonal PF 11/18/2016, 11/25/2017   Influenza,inj,Quad  PF,6+ Mos 12/07/2015   Influenza-Unspecified 01/10/2021   Moderna Sars-Covid-2 Vaccination 02/26/2019, 03/26/2019, 11/27/2019, 07/10/2020, 12/16/2020   PPD Test 01/17/2016   Pneumococcal Conjugate-13 12/13/2015   Pneumococcal Polysaccharide-23  07/05/2014   Tdap 12/07/2015    TDAP status: Up to date  Flu Vaccine status: Up to date  Pneumococcal vaccine status: Up to date  Covid-19 vaccine status: Completed vaccines  Qualifies for Shingles Vaccine? Yes   Zostavax completed No   Shingrix Completed?: No.    Education has been provided regarding the importance of this vaccine. Patient has been advised to call insurance company to determine out of pocket expense if they have not yet received this vaccine. Advised may also receive vaccine at local pharmacy or Health Dept. Verbalized acceptance and understanding.  Screening Tests Health Maintenance  Topic Date Due   Zoster Vaccines- Shingrix (1 of 2) 05/01/2021 (Originally 10/21/1996)   Hepatitis C Screening  01/31/2022 (Originally 10/21/1963)   MAMMOGRAM  01/30/2023   COLONOSCOPY (Pts 45-18yrs Insurance coverage will need to be confirmed)  02/05/2025   TETANUS/TDAP  12/06/2025   Pneumonia Vaccine 56+ Years old  Completed   INFLUENZA VACCINE  Completed   DEXA SCAN  Completed   COVID-19 Vaccine  Completed   HPV VACCINES  Aged Out    Health Maintenance  There are no preventive care reminders to display for this patient.  Colorectal cancer screening: Type of screening: Colonoscopy. Completed 04/03/17. Repeat every 10 years  Mammogram status: Completed 01/29/21. Repeat every year  Bone Density status: Completed 01/01/18. Results reflect: Bone density results: OSTEOPENIA. Repeat every 2 years. Pt declined repeat screening at this time.   Lung Cancer Screening: (Low Dose CT Chest recommended if Age 53-80 years, 30 pack-year currently smoking OR have quit w/in 15years.) does not qualify.  Additional Screening:  Hepatitis C Screening:  does qualify; postponed  Vision Screening: Recommended annual ophthalmology exams for early detection of glaucoma and other disorders of the eye. Is the patient up to date with their annual eye exam?  Yes  Who is the provider or what is the name of the office in which the patient attends annual eye exams? [facility].   Dental Screening: Recommended annual dental exams for proper oral hygiene  Community Resource Referral / Chronic Care Management: CRR required this visit?  No   CCM required this visit?  No      Plan:     I have personally reviewed and noted the following in the patients chart:   Medical and social history Use of alcohol, tobacco or illicit drugs  Current medications and supplements including opioid prescriptions.  Functional ability and status Nutritional status Physical activity Advanced directives List of other physicians Hospitalizations, surgeries, and ER visits in previous 12 months Vitals Screenings to include cognitive, depression, and falls Referrals and appointments  In addition, I have reviewed and discussed with patient certain preventive protocols, quality metrics, and best practice recommendations. A written personalized care plan for preventive services as well as general preventive health recommendations were provided to patient.   Due to this being a telephonic visit, the after visit summary with patients personalized plan was offered to patient via my-chart.  Clemetine Marker, LPN   1/32/4401   Nurse Notes: none

## 2021-03-03 DIAGNOSIS — E785 Hyperlipidemia, unspecified: Secondary | ICD-10-CM | POA: Diagnosis not present

## 2021-03-03 DIAGNOSIS — K219 Gastro-esophageal reflux disease without esophagitis: Secondary | ICD-10-CM | POA: Diagnosis not present

## 2021-03-03 DIAGNOSIS — M8580 Other specified disorders of bone density and structure, unspecified site: Secondary | ICD-10-CM | POA: Diagnosis not present

## 2021-03-03 DIAGNOSIS — Z008 Encounter for other general examination: Secondary | ICD-10-CM | POA: Diagnosis not present

## 2021-03-03 DIAGNOSIS — M1990 Unspecified osteoarthritis, unspecified site: Secondary | ICD-10-CM | POA: Diagnosis not present

## 2021-03-03 DIAGNOSIS — M792 Neuralgia and neuritis, unspecified: Secondary | ICD-10-CM | POA: Diagnosis not present

## 2021-03-03 DIAGNOSIS — I700 Atherosclerosis of aorta: Secondary | ICD-10-CM | POA: Diagnosis not present

## 2021-03-03 DIAGNOSIS — G8929 Other chronic pain: Secondary | ICD-10-CM | POA: Diagnosis not present

## 2021-03-03 DIAGNOSIS — E663 Overweight: Secondary | ICD-10-CM | POA: Diagnosis not present

## 2021-03-03 DIAGNOSIS — J301 Allergic rhinitis due to pollen: Secondary | ICD-10-CM | POA: Diagnosis not present

## 2021-03-03 DIAGNOSIS — Z604 Social exclusion and rejection: Secondary | ICD-10-CM | POA: Diagnosis not present

## 2021-03-03 DIAGNOSIS — I10 Essential (primary) hypertension: Secondary | ICD-10-CM | POA: Diagnosis not present

## 2021-03-03 DIAGNOSIS — R32 Unspecified urinary incontinence: Secondary | ICD-10-CM | POA: Diagnosis not present

## 2021-03-16 ENCOUNTER — Other Ambulatory Visit: Payer: Self-pay | Admitting: Family Medicine

## 2021-03-16 DIAGNOSIS — M5412 Radiculopathy, cervical region: Secondary | ICD-10-CM

## 2021-03-16 DIAGNOSIS — M4802 Spinal stenosis, cervical region: Secondary | ICD-10-CM | POA: Diagnosis not present

## 2021-03-16 DIAGNOSIS — M5030 Other cervical disc degeneration, unspecified cervical region: Secondary | ICD-10-CM | POA: Diagnosis not present

## 2021-04-04 ENCOUNTER — Other Ambulatory Visit: Payer: Self-pay

## 2021-04-04 ENCOUNTER — Ambulatory Visit
Admission: RE | Admit: 2021-04-04 | Discharge: 2021-04-04 | Disposition: A | Discharge status: Home / Self Care | Payer: Medicare HMO | Source: Ambulatory Visit | Attending: Family Medicine | Admitting: Family Medicine

## 2021-04-04 DIAGNOSIS — R200 Anesthesia of skin: Secondary | ICD-10-CM | POA: Diagnosis not present

## 2021-04-04 DIAGNOSIS — M5412 Radiculopathy, cervical region: Secondary | ICD-10-CM | POA: Diagnosis not present

## 2021-04-04 DIAGNOSIS — M542 Cervicalgia: Secondary | ICD-10-CM | POA: Diagnosis not present

## 2021-04-09 DIAGNOSIS — M5030 Other cervical disc degeneration, unspecified cervical region: Secondary | ICD-10-CM | POA: Diagnosis not present

## 2021-04-09 DIAGNOSIS — M4802 Spinal stenosis, cervical region: Secondary | ICD-10-CM | POA: Diagnosis not present

## 2021-04-09 DIAGNOSIS — M5412 Radiculopathy, cervical region: Secondary | ICD-10-CM | POA: Diagnosis not present

## 2021-04-10 ENCOUNTER — Other Ambulatory Visit: Payer: Self-pay | Admitting: Family Medicine

## 2021-04-10 DIAGNOSIS — M5412 Radiculopathy, cervical region: Secondary | ICD-10-CM

## 2021-05-01 ENCOUNTER — Ambulatory Visit
Admission: RE | Admit: 2021-05-01 | Discharge: 2021-05-01 | Disposition: A | Discharge status: Home / Self Care | Payer: Medicare HMO | Source: Ambulatory Visit | Attending: Family Medicine | Admitting: Family Medicine

## 2021-05-01 DIAGNOSIS — M50123 Cervical disc disorder at C6-C7 level with radiculopathy: Secondary | ICD-10-CM | POA: Diagnosis not present

## 2021-05-01 DIAGNOSIS — M5412 Radiculopathy, cervical region: Secondary | ICD-10-CM

## 2021-05-01 DIAGNOSIS — M50121 Cervical disc disorder at C4-C5 level with radiculopathy: Secondary | ICD-10-CM | POA: Diagnosis not present

## 2021-05-01 DIAGNOSIS — M50122 Cervical disc disorder at C5-C6 level with radiculopathy: Secondary | ICD-10-CM | POA: Diagnosis not present

## 2021-05-01 MED ORDER — IOPAMIDOL (ISOVUE-M 300) INJECTION 61%
1.0000 mL | Freq: Once | INTRAMUSCULAR | Status: AC
  Administered 2021-05-01: 1 mL via EPIDURAL

## 2021-05-01 MED ORDER — TRIAMCINOLONE ACETONIDE 40 MG/ML IJ SUSP (RADIOLOGY)
60.0000 mg | Freq: Once | INTRAMUSCULAR | Status: AC
  Administered 2021-05-01: 60 mg via EPIDURAL

## 2021-05-01 NOTE — Progress Notes (Signed)
Pt came over to the nursing recovery area post cervical epidural steroid injection. Pt did c/o pain in neck region from injection but no other complaints. Pt was found to be hypertensive but without any complaints other than the neck pain. The patients BP was taken numerous times and remained high.The patient did report her pain had subsided while sitting in the recovery area. Pt also reported she took all of her normal medications this AM, other than the ASA we asked her to hold. Dr. Jobe Igo advised the patient to go home and check her BP, if it remained high to contact her PCP. If she started to develop any other symptoms especially headache, chest pain, jaw pain, arm pain, to seek urgent medication attention. Pt verbalized understanding and wished to be discharged.  ?

## 2021-05-01 NOTE — Discharge Instructions (Signed)

## 2021-05-28 DIAGNOSIS — M5030 Other cervical disc degeneration, unspecified cervical region: Secondary | ICD-10-CM | POA: Diagnosis not present

## 2021-05-28 DIAGNOSIS — M5412 Radiculopathy, cervical region: Secondary | ICD-10-CM | POA: Diagnosis not present

## 2021-05-28 DIAGNOSIS — M4802 Spinal stenosis, cervical region: Secondary | ICD-10-CM | POA: Diagnosis not present

## 2021-08-01 ENCOUNTER — Encounter: Payer: Self-pay | Admitting: Family Medicine

## 2021-08-01 ENCOUNTER — Ambulatory Visit (INDEPENDENT_AMBULATORY_CARE_PROVIDER_SITE_OTHER): Payer: Medicare HMO | Admitting: Family Medicine

## 2021-08-01 VITALS — BP 120/88 | HR 76 | Ht 61.0 in | Wt 150.0 lb

## 2021-08-01 DIAGNOSIS — I10 Essential (primary) hypertension: Secondary | ICD-10-CM | POA: Diagnosis not present

## 2021-08-01 DIAGNOSIS — K219 Gastro-esophageal reflux disease without esophagitis: Secondary | ICD-10-CM | POA: Diagnosis not present

## 2021-08-01 DIAGNOSIS — E782 Mixed hyperlipidemia: Secondary | ICD-10-CM

## 2021-08-01 MED ORDER — PANTOPRAZOLE SODIUM 40 MG PO TBEC: 40.0000 mg | DELAYED_RELEASE_TABLET | Freq: Every day | ORAL | 1 refills | Status: DC

## 2021-08-01 MED ORDER — LOSARTAN POTASSIUM-HCTZ 50-12.5 MG PO TABS: 1.0000 | ORAL_TABLET | Freq: Every day | ORAL | 1 refills | Status: DC

## 2021-08-01 NOTE — Progress Notes (Signed)
Date:  08/01/2021   Name:  Emily Boyer   DOB:  03-08-1945   MRN:  [id number]   Chief Complaint: Hypertension, Hyperlipidemia, and Gastroesophageal Reflux  Hypertension This is a chronic problem. The current episode started more than 1 year ago. The problem has been gradually improving since onset. The problem is controlled. Pertinent negatives include no blurred vision, chest pain, headaches, neck pain, orthopnea, palpitations, PND or shortness of breath. There are no associated agents to hypertension. There are no known risk factors for coronary artery disease. Past treatments include angiotensin blockers and diuretics. The current treatment provides moderate improvement. There are no compliance problems.  There is no history of angina, kidney disease, CAD/MI, CVA, heart failure, left ventricular hypertrophy, PVD or retinopathy. There is no history of chronic renal disease, a hypertension causing med or renovascular disease.  Hyperlipidemia This is a chronic problem. The current episode started more than 1 year ago. Condition status: pending. Recent lipid tests were reviewed and are normal. She has no history of chronic renal disease. Pertinent negatives include no chest pain, focal sensory loss, focal weakness, leg pain, myalgias or shortness of breath. Current antihyperlipidemic treatment includes diet change. The current treatment provides moderate improvement of lipids. There are no compliance problems.  Risk factors for coronary artery disease include dyslipidemia and hypertension.  Gastroesophageal Reflux She reports no abdominal pain, no chest pain, no coughing, no dysphagia, no heartburn, no nausea, no sore throat or no wheezing. This is a chronic problem. The current episode started more than 1 year ago. The problem occurs frequently. The problem has been gradually improving. Nothing aggravates the symptoms.    Lab Results  Component Value Date   NA 137 01/31/2021   K 4.8  01/31/2021   CO2 27 01/31/2021   GLUCOSE 97 01/31/2021   BUN 15 01/31/2021   CREATININE 0.88 01/31/2021   CALCIUM 10.1 01/31/2021   EGFR 69 01/31/2021   GFRNONAA 60 02/01/2020   Lab Results  Component Value Date   CHOL 177 01/31/2021   HDL 47 01/31/2021   LDLCALC 105 (H) 01/31/2021   TRIG 140 01/31/2021   CHOLHDL 5.3 (H) 12/04/2016   Lab Results  Component Value Date   TSH 1.240 08/20/2019   No results found for: "HGBA1C" Lab Results  Component Value Date   WBC 11.0 (H) 01/12/2019   HGB 13.9 01/12/2019   HCT 41.3 01/12/2019   MCV 88 01/12/2019   PLT 335 01/12/2019   Lab Results  Component Value Date   ALT 18 01/31/2021   AST 24 01/31/2021   ALKPHOS 94 01/31/2021   BILITOT 0.3 01/31/2021   No results found for: "25OHVITD2", "25OHVITD3", "VD25OH"   Review of Systems  Constitutional:  Negative for chills and fever.  HENT:  Negative for sinus pain and sore throat.   Eyes:  Negative for blurred vision.  Respiratory:  Negative for cough, shortness of breath and wheezing.   Cardiovascular:  Negative for chest pain, palpitations, orthopnea, leg swelling and PND.  Gastrointestinal:  Negative for abdominal pain, blood in stool, constipation, diarrhea, dysphagia, heartburn and nausea.  Endocrine: Negative for polydipsia.  Genitourinary:  Negative for dysuria, frequency, hematuria and urgency.  Musculoskeletal:  Negative for back pain, myalgias and neck pain.  Skin:  Negative for rash.  Allergic/Immunologic: Negative for environmental allergies.  Neurological:  Negative for dizziness, focal weakness and headaches.       Arm paresthesias  Hematological:  Does not bruise/bleed easily.  Psychiatric/Behavioral:  Negative  for suicidal ideas. The patient is not nervous/anxious.     Patient Active Problem List   Diagnosis Date Noted   H/O total hysterectomy 03/03/2020   DDD (degenerative disc disease), cervical 05/28/2018   Sebaceous cyst 12/20/2015   Abnormal  findings-gastrointestinal tract    Encounter for general adult medical examination without abnormal findings 07/04/2014   Nerve root pain 07/04/2014   Gastroesophageal reflux disease 07/04/2014   HLD (hyperlipidemia) 07/04/2014   Hypertension 07/04/2014    Allergies  Allergen Reactions   Guaifenesin & Derivatives    Methocarbamol Itching   Penicillins Itching   Tramadol Hcl Itching    Past Surgical History:  Procedure Laterality Date   BREAST BIOPSY Right 2004   neg   CATARACT EXTRACTION W/PHACO Right 04/29/2017   Procedure: CATARACT EXTRACTION PHACO AND INTRAOCULAR LENS PLACEMENT ([place]) RIGHT;  Surgeon: Eulogio Bear, MD;  Location: [place];  Service: Ophthalmology;  Laterality: Right;   CATARACT EXTRACTION W/PHACO Left 07/15/2017   Procedure: CATARACT EXTRACTION PHACO AND INTRAOCULAR LENS PLACEMENT ([place]) LEFT;  Surgeon: Eulogio Bear, MD;  Location: [place];  Service: Ophthalmology;  Laterality: Left;   COLONOSCOPY WITH PROPOFOL N/A 02/06/2015   Procedure: COLONOSCOPY WITH PROPOFOL;  Surgeon: [name], MD;  Location: [place];  Service: Endoscopy;  Laterality: N/A;   EYE SURGERY  2019   TUBAL LIGATION  1980s   VAGINAL HYSTERECTOMY  1990s   one ovary removed as well    Social History   Tobacco Use   Smoking status: Former    Packs/day: 0.50    Years: 50.00    Total pack years: 25.00    Types: Cigarettes, E-cigarettes    Quit date: 11/22/2011    Years since quitting: 9.6   Smokeless tobacco: Never   Tobacco comments:    quit 2014  Vaping Use   Vaping Use: Never used  Substance Use Topics   Alcohol use: Yes    Alcohol/week: 2.0 standard drinks of alcohol    Types: 2 Glasses of wine per week   Drug use: No     Medication list has been reviewed and updated.  Current Meds  Medication Sig   acetaminophen (TYLENOL) 500 MG tablet Take 500 mg by mouth every 6 (six) hours as needed.   aspirin EC 81 MG tablet Take by mouth.    atorvastatin (LIPITOR) 10 MG tablet Take 1 tablet (10 mg total) by mouth daily.   cholecalciferol (VITAMIN D) 1000 units tablet Take 1,000 Units by mouth daily.   losartan-hydrochlorothiazide (HYZAAR) 50-12.5 MG tablet Take 1 tablet by mouth daily.   Multiple Vitamins-Minerals (CENTRUM SILVER 50+WOMEN) TABS Take by mouth.   Omega 3 1000 MG CAPS Take 1 capsule by mouth daily.   pantoprazole (PROTONIX) 40 MG tablet Take 1 tablet (40 mg total) by mouth daily.   triamcinolone (KENALOG) 0.1 % Apply 1 application topically 2 (two) times daily.   Zinc Oxide-Vitamin C (ZINC PLUS VITAMIN C PO) Take 1 tablet by mouth daily.       08/01/2021    8:19 AM 01/31/2021    8:24 AM 10/11/2020    8:00 AM 03/03/2020    4:14 PM  GAD 7 : Generalized Anxiety Score  Nervous, Anxious, on Edge 0 0 0 0  Control/stop worrying 0 0 0 0  Worry too much - different things 0 0 0 0  Trouble relaxing 0 0 0 0  Restless 0 0 0 0  Easily annoyed or irritable 0 0  0 0  Afraid - awful might happen 0 0 0 0  Total GAD 7 Score 0 0 0 0  Anxiety Difficulty Not difficult at all Not difficult at all         08/01/2021    8:19 AM 02/12/2021    4:01 PM 01/31/2021    8:24 AM  Depression screen PHQ 2/9  Decreased Interest 0 0 0  Down, Depressed, Hopeless 0 0 0  PHQ - 2 Score 0 0 0  Altered sleeping 3  0  Tired, decreased energy 2  0  Change in appetite 0  0  Feeling bad or failure about yourself  0  0  Trouble concentrating 0  0  Moving slowly or fidgety/restless 0  0  Suicidal thoughts 0  0  PHQ-9 Score 5  0  Difficult doing work/chores Not difficult at all  Not difficult at all    BP Readings from Last 3 Encounters:  08/01/21 120/88  05/01/21 (!) 194/90  02/06/21 122/66    Physical Exam Vitals and nursing note reviewed. Exam conducted with a chaperone present.  Constitutional:      General: She is not in acute distress.    Appearance: She is not diaphoretic.  HENT:     Head: Normocephalic and atraumatic.      Right Ear: External ear normal.     Left Ear: External ear normal.     Nose: Nose normal.  Eyes:     General:        Right eye: No discharge.        Left eye: No discharge.     Conjunctiva/sclera: Conjunctivae normal.     Pupils: Pupils are equal, round, and reactive to light.  Neck:     Thyroid: No thyromegaly.     Vascular: No JVD.  Cardiovascular:     Rate and Rhythm: Normal rate and regular rhythm.     Heart sounds: Normal heart sounds. No murmur heard.    No friction rub. No gallop.  Pulmonary:     Effort: Pulmonary effort is normal.     Breath sounds: Normal breath sounds.  Abdominal:     General: Bowel sounds are normal.     Palpations: Abdomen is soft. There is no mass.     Tenderness: There is no abdominal tenderness. There is no guarding.  Musculoskeletal:        General: Normal range of motion.     Cervical back: Normal range of motion and neck supple.  Lymphadenopathy:     Cervical: No cervical adenopathy.  Skin:    General: Skin is warm and dry.  Neurological:     Mental Status: She is alert.     Wt Readings from Last 3 Encounters:  08/01/21 150 lb (68 kg)  02/06/21 145 lb (65.8 kg)  01/31/21 147 lb (66.7 kg)    BP 120/88   Pulse 76   Ht '5\' 1"'  (1.549 m)   Wt 150 lb (68 kg)   BMI 28.34 kg/m   Assessment and Plan:  1. Essential hypertension Chronic.  Controlled.  Stable.  Blood pressure 120/88.  Asymptomatic.  Tolerating medication.  Continue losartan hydrochlorothiazide 50-12.5 mg once a day.  We will recheck in 6 months. - losartan-hydrochlorothiazide (HYZAAR) 50-12.5 MG tablet; Take 1 tablet by mouth daily.  Dispense: 90 tablet; Refill: 1  2. Mixed hyperlipidemia Patient has been out of her medications for the last 3 to 4 weeks.  Chronic.  Controlled.  Stable.  Review of previous lipids that they were over 150 LDL range prior to initiating atorvastatin.  We will recheck off atorvastatin and if it is under 130 we will likely control with diet.   Patient has been given dietary control and we will check lipid panel today. - Lipid Panel With LDL/HDL Ratio  3. Gastroesophageal reflux disease, unspecified whether esophagitis present Chronic.  Controlled.  Stable.  Continue pantoprazole 40 mg once a day. - pantoprazole (PROTONIX) 40 MG tablet; Take 1 tablet (40 mg total) by mouth daily.  Dispense: 90 tablet; Refill: 1

## 2021-08-01 NOTE — Patient Instructions (Signed)

## 2021-08-02 ENCOUNTER — Other Ambulatory Visit: Payer: Self-pay

## 2021-08-02 DIAGNOSIS — E782 Mixed hyperlipidemia: Secondary | ICD-10-CM

## 2021-08-02 LAB — LIPID PANEL WITH LDL/HDL RATIO
Cholesterol, Total: 239 mg/dL — ABNORMAL HIGH (ref 100–199)
HDL: 40 mg/dL (ref >39)
LDL Chol Calc (NIH): 158 mg/dL — ABNORMAL HIGH (ref 0–99)
LDL/HDL Ratio: 4 ratio — ABNORMAL HIGH (ref 0.0–3.2)
Triglycerides: 221 mg/dL — ABNORMAL HIGH (ref 0–149)
VLDL Cholesterol Cal: 41 mg/dL — ABNORMAL HIGH (ref 5–40)

## 2021-08-02 MED ORDER — ATORVASTATIN CALCIUM 10 MG PO TABS: 10.0000 mg | ORAL_TABLET | Freq: Every day | ORAL | 1 refills | Status: DC

## 2021-10-28 ENCOUNTER — Ambulatory Visit (INDEPENDENT_AMBULATORY_CARE_PROVIDER_SITE_OTHER)
Admission: EM | Admit: 2021-10-28 | Discharge: 2021-10-28 | Disposition: A | Discharge status: Home / Self Care | Payer: Medicare HMO | Source: Home / Self Care

## 2021-10-28 DIAGNOSIS — R5383 Other fatigue: Secondary | ICD-10-CM | POA: Diagnosis not present

## 2021-10-28 DIAGNOSIS — R6520 Severe sepsis without septic shock: Secondary | ICD-10-CM | POA: Diagnosis not present

## 2021-10-28 DIAGNOSIS — J029 Acute pharyngitis, unspecified: Secondary | ICD-10-CM | POA: Insufficient documentation

## 2021-10-28 DIAGNOSIS — N179 Acute kidney failure, unspecified: Secondary | ICD-10-CM | POA: Diagnosis not present

## 2021-10-28 DIAGNOSIS — R42 Dizziness and giddiness: Secondary | ICD-10-CM | POA: Diagnosis not present

## 2021-10-28 DIAGNOSIS — J189 Pneumonia, unspecified organism: Secondary | ICD-10-CM | POA: Diagnosis not present

## 2021-10-28 DIAGNOSIS — A419 Sepsis, unspecified organism: Secondary | ICD-10-CM | POA: Diagnosis not present

## 2021-10-28 DIAGNOSIS — R519 Headache, unspecified: Secondary | ICD-10-CM | POA: Diagnosis not present

## 2021-10-28 DIAGNOSIS — R000 Tachycardia, unspecified: Secondary | ICD-10-CM | POA: Diagnosis not present

## 2021-10-28 DIAGNOSIS — R110 Nausea: Secondary | ICD-10-CM | POA: Diagnosis not present

## 2021-10-28 LAB — GROUP A STREP BY PCR: Group A Strep by PCR: NOT DETECTED

## 2021-10-28 NOTE — ED Triage Notes (Signed)
Pt c/o sore throat, tender glands onset yesterday morning, hurting to swallow.

## 2021-10-28 NOTE — Discharge Instructions (Addendum)
Your strep test today was negative.  There is a possibility that your sore throat and swollen glands are a result of your recent COVID-vaccine booster that she received 3 days ago.  This can cause those exact symptoms.  Because she recently had a COVID vaccine I am not going to test you for COVID as it can trigger a false positive.  Gargle with warm salt water 2-3 times a day to soothe your throat, aid in pain relief, and aid in healing.  Take over-the-counter ibuprofen according to the package instructions as needed for pain.  You can also use Chloraseptic or Sucrets lozenges, 1 lozenge every 2 hours as needed for throat pain.  If you develop any new or worsening symptoms return for reevaluation.

## 2021-10-28 NOTE — ED Provider Notes (Signed)
[facility]    CSN: [id number] Arrival date & time: 10/28/21  1121      History   Chief Complaint Chief Complaint  Patient presents with   Sore Throat    HPI Emily Boyer is a 76 y.o. female.   HPI  76 year old female here for evaluation of sore throat.  Patient reports that she developed a sore throat yesterday morning with tender glands and painful swallowing.  She had some chills yesterday and has had a scant productive cough.  She denies fever, runny nose, nasal congestion, ear pain, shortness of breath, or wheezing.  She did take her fifth COVID booster 3 days ago.  Past Medical History:  Diagnosis Date   Allergy Sinus   Bone spur    right side of neck   Colitis    DDD (degenerative disc disease), cervical    Diverticulitis    GERD (gastroesophageal reflux disease)    Heart murmur    followed by PCP   HLD (hyperlipidemia)    Hypertension    Seasonal allergies     Patient Active Problem List   Diagnosis Date Noted   H/O total hysterectomy 03/03/2020   DDD (degenerative disc disease), cervical 05/28/2018   Sebaceous cyst 12/20/2015   Abnormal findings-gastrointestinal tract    Encounter for general adult medical examination without abnormal findings 07/04/2014   Nerve root pain 07/04/2014   Gastroesophageal reflux disease 07/04/2014   HLD (hyperlipidemia) 07/04/2014   Hypertension 07/04/2014    Past Surgical History:  Procedure Laterality Date   BREAST BIOPSY Right 2004   neg   CATARACT EXTRACTION W/PHACO Right 04/29/2017   Procedure: CATARACT EXTRACTION PHACO AND INTRAOCULAR LENS PLACEMENT ([place]) RIGHT;  Surgeon: Eulogio Bear, MD;  Location: [place];  Service: Ophthalmology;  Laterality: Right;   CATARACT EXTRACTION W/PHACO Left 07/15/2017   Procedure: CATARACT EXTRACTION PHACO AND INTRAOCULAR LENS PLACEMENT ([place]) LEFT;  Surgeon: Eulogio Bear, MD;  Location: [place];  Service: Ophthalmology;  Laterality:  Left;   COLONOSCOPY WITH PROPOFOL N/A 02/06/2015   Procedure: COLONOSCOPY WITH PROPOFOL;  Surgeon: [name], MD;  Location: [place];  Service: Endoscopy;  Laterality: N/A;   EYE SURGERY  2019   TUBAL LIGATION  1980s   VAGINAL HYSTERECTOMY  1990s   one ovary removed as well    OB History   No obstetric history on file.      Home Medications    Prior to Admission medications   Medication Sig Start Date End Date Taking? Authorizing Provider  acetaminophen (TYLENOL) 500 MG tablet Take 500 mg by mouth every 6 (six) hours as needed.   Yes [provider]  aspirin EC 81 MG tablet Take by mouth.   Yes [provider]  atorvastatin (LIPITOR) 10 MG tablet Take 1 tablet (10 mg total) by mouth daily. 08/02/21  Yes Juline Patch, MD  cholecalciferol (VITAMIN D) 1000 units tablet Take 1,000 Units by mouth daily.   Yes [provider]  losartan-hydrochlorothiazide (HYZAAR) 50-12.5 MG tablet Take 1 tablet by mouth daily. 08/01/21  Yes Juline Patch, MD  Multiple Vitamins-Minerals (CENTRUM SILVER 50+WOMEN) TABS Take by mouth.   Yes [provider]  Omega 3 1000 MG CAPS Take 1 capsule by mouth daily.   Yes [provider]  pantoprazole (PROTONIX) 40 MG tablet Take 1 tablet (40 mg total) by mouth daily. 08/01/21  Yes Juline Patch, MD  triamcinolone (KENALOG) 0.1 % Apply 1 application topically  2 (two) times daily. 01/31/20  Yes Juline Patch, MD  Zinc Oxide-Vitamin C (ZINC PLUS VITAMIN C PO) Take 1 tablet by mouth daily.   Yes [provider]    Family History Family History  Problem Relation Age of Onset   Stroke Mother    Hypertension Mother    Arthritis Father    Heart disease Paternal Aunt    Cancer Paternal Grandmother    Stomach cancer Paternal Grandmother    Breast cancer Cousin        pat cousin    Social History Social History   Tobacco Use   Smoking status: Former    Packs/day: 0.50    Years: 50.00     Total pack years: 25.00    Types: Cigarettes, E-cigarettes    Quit date: 11/22/2011    Years since quitting: 9.9   Smokeless tobacco: Never   Tobacco comments:    quit 2014  Vaping Use   Vaping Use: Never used  Substance Use Topics   Alcohol use: Yes    Alcohol/week: 2.0 standard drinks of alcohol    Types: 2 Glasses of wine per week   Drug use: No     Allergies   Guaifenesin & derivatives, Methocarbamol, Penicillins, and Tramadol hcl   Review of Systems Review of Systems  Constitutional:  Positive for chills. Negative for fever.  HENT:  Positive for sore throat. Negative for congestion, ear pain and rhinorrhea.   Respiratory:  Positive for cough. Negative for shortness of breath and wheezing.   Hematological:  Positive for adenopathy.     Physical Exam Triage Vital Signs ED Triage Vitals  Enc Vitals Group     BP 10/28/21 1156 (!) 142/84     Pulse Rate 10/28/21 1156 73     Resp --      Temp 10/28/21 1156 98.9 F (37.2 C)     Temp Source 10/28/21 1156 Oral     SpO2 10/28/21 1156 96 %     Weight 10/28/21 1154 148 lb (67.1 kg)     Height 10/28/21 1154 '5\' 1"'$  (1.549 m)     Head Circumference --      Peak Flow --      Pain Score 10/28/21 1154 7     Pain Loc --      Pain Edu? --      Excl. in [place]? --    No data found.  Updated Vital Signs BP (!) 142/84 (BP Location: Right Arm)   Pulse 73   Temp 98.9 F (37.2 C) (Oral)   Ht '5\' 1"'$  (1.549 m)   Wt 148 lb (67.1 kg)   SpO2 96%   BMI 27.96 kg/m   Visual Acuity Right Eye Distance:   Left Eye Distance:   Bilateral Distance:    Right Eye Near:   Left Eye Near:    Bilateral Near:     Physical Exam Vitals and nursing note reviewed.  Constitutional:      Appearance: Normal appearance. She is not ill-appearing.  HENT:     Head: Normocephalic and atraumatic.     Right Ear: Tympanic membrane, ear canal and external ear normal. There is no impacted cerumen.     Left Ear: Tympanic membrane, ear canal and external  ear normal. There is no impacted cerumen.     Nose: Congestion and rhinorrhea present.     Mouth/Throat:     Mouth: Mucous membranes are moist.     Pharynx: Oropharynx is clear.  Posterior oropharyngeal erythema present. No oropharyngeal exudate.  Cardiovascular:     Rate and Rhythm: Normal rate and regular rhythm.     Pulses: Normal pulses.     Heart sounds: Normal heart sounds. No murmur heard.    No friction rub. No gallop.  Pulmonary:     Effort: Pulmonary effort is normal.     Breath sounds: Normal breath sounds. No wheezing, rhonchi or rales.  Musculoskeletal:     Cervical back: Normal range of motion and neck supple. Tenderness present.  Lymphadenopathy:     Cervical: Cervical adenopathy present.  Skin:    General: Skin is warm and dry.     Capillary Refill: Capillary refill takes less than 2 seconds.     Findings: No erythema or rash.  Neurological:     General: No focal deficit present.     Mental Status: She is alert and oriented to person, place, and time.  Psychiatric:        Mood and Affect: Mood normal.        Behavior: Behavior normal.        Thought Content: Thought content normal.        Judgment: Judgment normal.      UC Treatments / Results  Labs (all labs ordered are listed, but only abnormal results are displayed) Labs Reviewed  GROUP A STREP BY PCR    EKG   Radiology No results found.  Procedures Procedures (including critical care time)  Medications Ordered in UC Medications - No data to display  Initial Impression / Assessment and Plan / UC Course  I have reviewed the triage vital signs and the nursing notes.  Pertinent labs & imaging results that were available during my care of the patient were reviewed by me and considered in my medical decision making (see chart for details).   Patient is a pleasant, nontoxic-appearing 76 year old female here for evaluation of sore throat, swollen glands, painful swallowing that began yesterday  morning.  2 days prior she had her fifth COVID booster.  She does endorse a intermittent cough that is productive for scant amount of sputum.  The patient also endorses chills.  She denies any fever, runny nose or congestion, ear pain, shortness breath, or wheezing.  On exam she does have inflammation of her nasal mucosa with scant clear rhinorrhea.  Oropharyngeal exam reveals posterior oropharyngeal erythema as well as felt the erythema of the tonsillar pillars without overt edema or exudate.  She does have anterior cervical lymphadenopathy on exam.  Her cardiopulmonary exam bisque lung sounds in all fields.  Given the erythema the back of her throat I will order a strep PCR.  I suspect that patient's symptoms may be a result of the immune response coming from her recent COVID booster vaccination.  Strep PCR is negative.  I will discharge patient home with a diagnosis of pharyngitis.  I believe it is secondary to her immune response to her recent COVID immunization.  Patient can use over-the-counter Tylenol and ibuprofen, Chloraseptic describes lozenges, and salt water gargles to help soothe her throat.   Final Clinical Impressions(s) / UC Diagnoses   Final diagnoses:  Pharyngitis, unspecified etiology     Discharge Instructions      Your strep test today was negative.  There is a possibility that your sore throat and swollen glands are a result of your recent COVID-vaccine booster that she received 3 days ago.  This can cause those exact symptoms.  Because she recently had  a COVID vaccine I am not going to test you for COVID as it can trigger a false positive.  Gargle with warm salt water 2-3 times a day to soothe your throat, aid in pain relief, and aid in healing.  Take over-the-counter ibuprofen according to the package instructions as needed for pain.  You can also use Chloraseptic or Sucrets lozenges, 1 lozenge every 2 hours as needed for throat pain.  If you develop any new or  worsening symptoms return for reevaluation.      ED Prescriptions   None    PDMP not reviewed this encounter.   Margarette Canada, NP 10/28/21 1248

## 2021-10-29 ENCOUNTER — Other Ambulatory Visit: Payer: Self-pay

## 2021-10-29 ENCOUNTER — Emergency Department: Payer: Medicare HMO

## 2021-10-29 DIAGNOSIS — Z803 Family history of malignant neoplasm of breast: Secondary | ICD-10-CM

## 2021-10-29 DIAGNOSIS — Z800 Family history of malignant neoplasm of digestive organs: Secondary | ICD-10-CM

## 2021-10-29 DIAGNOSIS — Z20822 Contact with and (suspected) exposure to covid-19: Secondary | ICD-10-CM | POA: Diagnosis present

## 2021-10-29 DIAGNOSIS — N179 Acute kidney failure, unspecified: Secondary | ICD-10-CM | POA: Diagnosis present

## 2021-10-29 DIAGNOSIS — A419 Sepsis, unspecified organism: Principal | ICD-10-CM | POA: Diagnosis present

## 2021-10-29 DIAGNOSIS — Z7982 Long term (current) use of aspirin: Secondary | ICD-10-CM

## 2021-10-29 DIAGNOSIS — Z79899 Other long term (current) drug therapy: Secondary | ICD-10-CM

## 2021-10-29 DIAGNOSIS — I10 Essential (primary) hypertension: Secondary | ICD-10-CM | POA: Diagnosis not present

## 2021-10-29 DIAGNOSIS — R42 Dizziness and giddiness: Secondary | ICD-10-CM | POA: Diagnosis not present

## 2021-10-29 DIAGNOSIS — R5383 Other fatigue: Secondary | ICD-10-CM | POA: Diagnosis not present

## 2021-10-29 DIAGNOSIS — Z87891 Personal history of nicotine dependence: Secondary | ICD-10-CM

## 2021-10-29 DIAGNOSIS — E785 Hyperlipidemia, unspecified: Secondary | ICD-10-CM | POA: Diagnosis present

## 2021-10-29 DIAGNOSIS — R110 Nausea: Secondary | ICD-10-CM | POA: Diagnosis not present

## 2021-10-29 DIAGNOSIS — R519 Headache, unspecified: Secondary | ICD-10-CM | POA: Diagnosis not present

## 2021-10-29 DIAGNOSIS — Z8249 Family history of ischemic heart disease and other diseases of the circulatory system: Secondary | ICD-10-CM

## 2021-10-29 DIAGNOSIS — K219 Gastro-esophageal reflux disease without esophagitis: Secondary | ICD-10-CM | POA: Diagnosis present

## 2021-10-29 DIAGNOSIS — R000 Tachycardia, unspecified: Secondary | ICD-10-CM | POA: Diagnosis not present

## 2021-10-29 DIAGNOSIS — R0689 Other abnormalities of breathing: Secondary | ICD-10-CM | POA: Diagnosis not present

## 2021-10-29 DIAGNOSIS — R6520 Severe sepsis without septic shock: Secondary | ICD-10-CM | POA: Diagnosis not present

## 2021-10-29 DIAGNOSIS — J189 Pneumonia, unspecified organism: Secondary | ICD-10-CM | POA: Diagnosis not present

## 2021-10-29 DIAGNOSIS — R1084 Generalized abdominal pain: Secondary | ICD-10-CM | POA: Diagnosis not present

## 2021-10-29 DIAGNOSIS — Z880 Allergy status to penicillin: Secondary | ICD-10-CM

## 2021-10-29 DIAGNOSIS — Z823 Family history of stroke: Secondary | ICD-10-CM

## 2021-10-29 LAB — CBC WITH DIFFERENTIAL/PLATELET
Abs Immature Granulocytes: 0.05 10*3/uL (ref 0.00–0.07)
Basophils Absolute: 0 10*3/uL (ref 0.0–0.1)
Basophils Relative: 0 %
Eosinophils Absolute: 0 10*3/uL (ref 0.0–0.5)
Eosinophils Relative: 0 %
HCT: 39.5 % (ref 36.0–46.0)
Hemoglobin: 13.1 g/dL (ref 12.0–15.0)
Immature Granulocytes: 0 %
Lymphocytes Relative: 8 %
Lymphs Abs: 1.1 10*3/uL (ref 0.7–4.0)
MCH: 28.5 pg (ref 26.0–34.0)
MCHC: 33.2 g/dL (ref 30.0–36.0)
MCV: 85.9 fL (ref 80.0–100.0)
Monocytes Absolute: 0.7 10*3/uL (ref 0.1–1.0)
Monocytes Relative: 5 %
Neutro Abs: 11.4 10*3/uL — ABNORMAL HIGH (ref 1.7–7.7)
Neutrophils Relative %: 87 %
Platelets: 269 10*3/uL (ref 150–400)
RBC: 4.6 MIL/uL (ref 3.87–5.11)
RDW: 14.2 % (ref 11.5–15.5)
WBC: 13.3 10*3/uL — ABNORMAL HIGH (ref 4.0–10.5)
nRBC: 0 % (ref 0.0–0.2)

## 2021-10-29 LAB — COMPREHENSIVE METABOLIC PANEL
ALT: 31 U/L (ref 0–44)
AST: 40 U/L (ref 15–41)
Albumin: 4.4 g/dL (ref 3.5–5.0)
Alkaline Phosphatase: 98 U/L (ref 38–126)
Anion gap: 9 (ref 5–15)
BUN: 19 mg/dL (ref 8–23)
CO2: 26 mmol/L (ref 22–32)
Calcium: 9.7 mg/dL (ref 8.9–10.3)
Chloride: 103 mmol/L (ref 98–111)
Creatinine, Ser: 1.08 mg/dL — ABNORMAL HIGH (ref 0.44–1.00)
GFR, Estimated: 54 mL/min — ABNORMAL LOW (ref >60)
Glucose, Bld: 118 mg/dL — ABNORMAL HIGH (ref 70–99)
Potassium: 3.4 mmol/L — ABNORMAL LOW (ref 3.5–5.1)
Sodium: 138 mmol/L (ref 135–145)
Total Bilirubin: 0.9 mg/dL (ref 0.3–1.2)
Total Protein: 8.9 g/dL — ABNORMAL HIGH (ref 6.5–8.1)

## 2021-10-29 LAB — RESP PANEL BY RT-PCR (FLU A&B, COVID) ARPGX2
Influenza A by PCR: NEGATIVE
Influenza B by PCR: NEGATIVE
SARS Coronavirus 2 by RT PCR: NEGATIVE

## 2021-10-29 LAB — LIPASE, BLOOD: Lipase: 27 U/L (ref 11–51)

## 2021-10-29 LAB — BRAIN NATRIURETIC PEPTIDE: B Natriuretic Peptide: 22.9 pg/mL (ref 0.0–100.0)

## 2021-10-29 LAB — PROCALCITONIN: Procalcitonin: 0.51 ng/mL

## 2021-10-29 LAB — LACTIC ACID, PLASMA: Lactic Acid, Venous: 1.7 mmol/L (ref 0.5–1.9)

## 2021-10-29 MED ORDER — ACETAMINOPHEN 325 MG PO TABS
650.0000 mg | ORAL_TABLET | Freq: Once | ORAL | Status: AC | PRN
  Administered 2021-10-29: 650 mg via ORAL
  Filled 2021-10-29: qty 2

## 2021-10-29 NOTE — ED Triage Notes (Signed)
Pt to ED from work via [place] for episode of nausea and dizziness around 330pm. Pt still dizzy and nauseous, denies SOB, CP or diaphoresis.   Pt received covid vaccine on Thursday and been feeling bad ever since. HA, body aches, feeling tired.  Pulse noted to be 117 and oral temp is 103.2. SPO2 is 91% on room air.

## 2021-10-29 NOTE — ED Notes (Signed)
1 set cultures sent. 22# placed to L forearm.

## 2021-10-29 NOTE — ED Provider Triage Note (Signed)
  Emergency Medicine Provider Triage Evaluation Note  Emily Boyer , a 76 y.o.female,  was evaluated in triage.  Pt complains of nausea and lightheadedness.  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  She states that she has vomited once and has since felt lightheaded.  Denies any pain at this time.   Review of Systems  Positive: Nausea, vomiting Negative: Denies fever, chest pain, diarrhea  Physical Exam  There were no vitals filed for this visit. Gen:   Awake, no distress   Resp:  Normal effort  MSK:   Moves extremities without difficulty  Other:    Medical Decision Making  Given the patient's initial medical screening exam, the following diagnostic evaluation has been ordered. The patient will be placed in the appropriate treatment space, once one is available, to complete the evaluation and treatment. I have discussed the plan of care with the patient and I have advised the patient that an ED physician or mid-level practitioner will reevaluate their condition after the test results have been received, as the results may give them additional insight into the type of treatment they may need.    Diagnostics: Labs, EKG, UA  Treatments: none immediately   Teodoro Spray, [place] 10/29/21 1532

## 2021-10-30 ENCOUNTER — Inpatient Hospital Stay
Admission: EM | Admit: 2021-10-30 | Discharge: 2021-10-31 | DRG: 871 | Disposition: A | Discharge status: Home / Self Care | Payer: Medicare HMO | Attending: Internal Medicine | Admitting: Internal Medicine

## 2021-10-30 DIAGNOSIS — K219 Gastro-esophageal reflux disease without esophagitis: Secondary | ICD-10-CM | POA: Diagnosis not present

## 2021-10-30 DIAGNOSIS — Z803 Family history of malignant neoplasm of breast: Secondary | ICD-10-CM | POA: Diagnosis not present

## 2021-10-30 DIAGNOSIS — A419 Sepsis, unspecified organism: Secondary | ICD-10-CM | POA: Diagnosis present

## 2021-10-30 DIAGNOSIS — R42 Dizziness and giddiness: Secondary | ICD-10-CM

## 2021-10-30 DIAGNOSIS — Z8249 Family history of ischemic heart disease and other diseases of the circulatory system: Secondary | ICD-10-CM | POA: Diagnosis not present

## 2021-10-30 DIAGNOSIS — Z800 Family history of malignant neoplasm of digestive organs: Secondary | ICD-10-CM | POA: Diagnosis not present

## 2021-10-30 DIAGNOSIS — N179 Acute kidney failure, unspecified: Secondary | ICD-10-CM | POA: Diagnosis not present

## 2021-10-30 DIAGNOSIS — Z87891 Personal history of nicotine dependence: Secondary | ICD-10-CM | POA: Diagnosis not present

## 2021-10-30 DIAGNOSIS — I10 Essential (primary) hypertension: Secondary | ICD-10-CM | POA: Diagnosis present

## 2021-10-30 DIAGNOSIS — Z880 Allergy status to penicillin: Secondary | ICD-10-CM | POA: Diagnosis not present

## 2021-10-30 DIAGNOSIS — Z79899 Other long term (current) drug therapy: Secondary | ICD-10-CM | POA: Diagnosis not present

## 2021-10-30 DIAGNOSIS — Z20822 Contact with and (suspected) exposure to covid-19: Secondary | ICD-10-CM | POA: Diagnosis not present

## 2021-10-30 DIAGNOSIS — E785 Hyperlipidemia, unspecified: Secondary | ICD-10-CM | POA: Diagnosis not present

## 2021-10-30 DIAGNOSIS — Z823 Family history of stroke: Secondary | ICD-10-CM | POA: Diagnosis not present

## 2021-10-30 DIAGNOSIS — J189 Pneumonia, unspecified organism: Secondary | ICD-10-CM | POA: Diagnosis not present

## 2021-10-30 DIAGNOSIS — R110 Nausea: Secondary | ICD-10-CM

## 2021-10-30 DIAGNOSIS — Z7982 Long term (current) use of aspirin: Secondary | ICD-10-CM | POA: Diagnosis not present

## 2021-10-30 LAB — URINALYSIS, ROUTINE W REFLEX MICROSCOPIC
Bilirubin Urine: NEGATIVE
Glucose, UA: NEGATIVE mg/dL
Hgb urine dipstick: NEGATIVE
Ketones, ur: NEGATIVE mg/dL
Nitrite: NEGATIVE
Protein, ur: 30 mg/dL — AB
Specific Gravity, Urine: 1.017 (ref 1.005–1.030)
pH: 5 (ref 5.0–8.0)

## 2021-10-30 LAB — CBC
HCT: 32 % — ABNORMAL LOW (ref 36.0–46.0)
Hemoglobin: 10.6 g/dL — ABNORMAL LOW (ref 12.0–15.0)
MCH: 28.8 pg (ref 26.0–34.0)
MCHC: 33.1 g/dL (ref 30.0–36.0)
MCV: 87 fL (ref 80.0–100.0)
Platelets: 249 10*3/uL (ref 150–400)
RBC: 3.68 MIL/uL — ABNORMAL LOW (ref 3.87–5.11)
RDW: 14.3 % (ref 11.5–15.5)
WBC: 16.7 10*3/uL — ABNORMAL HIGH (ref 4.0–10.5)
nRBC: 0 % (ref 0.0–0.2)

## 2021-10-30 LAB — CORTISOL-AM, BLOOD: Cortisol - AM: 13.9 ug/dL (ref 6.7–22.6)

## 2021-10-30 LAB — CREATININE, SERUM
Creatinine, Ser: 1.05 mg/dL — ABNORMAL HIGH (ref 0.44–1.00)
GFR, Estimated: 55 mL/min — ABNORMAL LOW (ref >60)

## 2021-10-30 LAB — PROTIME-INR
INR: 1.2 (ref 0.8–1.2)
Prothrombin Time: 14.7 seconds (ref 11.4–15.2)

## 2021-10-30 MED ORDER — ONDANSETRON HCL 4 MG PO TABS: 4.0000 mg | ORAL_TABLET | Freq: Four times a day (QID) | ORAL | Status: DC | PRN

## 2021-10-30 MED ORDER — ONDANSETRON HCL 4 MG/2ML IJ SOLN: 4.0000 mg | Freq: Four times a day (QID) | INTRAMUSCULAR | Status: DC | PRN

## 2021-10-30 MED ORDER — SODIUM CHLORIDE 0.9 % IV SOLN
2.0000 g | INTRAVENOUS | Status: DC
  Administered 2021-10-31: 2 g via INTRAVENOUS
  Filled 2021-10-30: qty 2

## 2021-10-30 MED ORDER — LOSARTAN POTASSIUM 50 MG PO TABS: 50.0000 mg | ORAL_TABLET | Freq: Every day | ORAL | Status: DC

## 2021-10-30 MED ORDER — ASPIRIN 81 MG PO TBEC
81.0000 mg | DELAYED_RELEASE_TABLET | Freq: Every day | ORAL | Status: DC
  Administered 2021-10-30 – 2021-10-31 (×2): 81 mg via ORAL
  Filled 2021-10-30 (×2): qty 1

## 2021-10-30 MED ORDER — LACTATED RINGERS IV SOLN: INTRAVENOUS | Status: DC

## 2021-10-30 MED ORDER — PANTOPRAZOLE SODIUM 40 MG PO TBEC
40.0000 mg | DELAYED_RELEASE_TABLET | Freq: Every day | ORAL | Status: DC
  Administered 2021-10-30 – 2021-10-31 (×2): 40 mg via ORAL
  Filled 2021-10-30 (×2): qty 1

## 2021-10-30 MED ORDER — BENZONATATE 100 MG PO CAPS
200.0000 mg | ORAL_CAPSULE | Freq: Three times a day (TID) | ORAL | Status: DC | PRN
  Administered 2021-10-30: 200 mg via ORAL
  Filled 2021-10-30: qty 2

## 2021-10-30 MED ORDER — HYDROCHLOROTHIAZIDE 12.5 MG PO TABS: 12.5000 mg | ORAL_TABLET | Freq: Every day | ORAL | Status: DC

## 2021-10-30 MED ORDER — LOSARTAN POTASSIUM-HCTZ 50-12.5 MG PO TABS: 1.0000 | ORAL_TABLET | Freq: Every day | ORAL | Status: DC

## 2021-10-30 MED ORDER — ACETAMINOPHEN 325 MG PO TABS
650.0000 mg | ORAL_TABLET | Freq: Four times a day (QID) | ORAL | Status: DC | PRN
  Administered 2021-10-30 (×2): 650 mg via ORAL
  Filled 2021-10-30 (×2): qty 2

## 2021-10-30 MED ORDER — HYDROCODONE-ACETAMINOPHEN 5-325 MG PO TABS: 1.0000 | ORAL_TABLET | ORAL | Status: DC | PRN

## 2021-10-30 MED ORDER — SODIUM CHLORIDE 0.9 % IV SOLN
500.0000 mg | INTRAVENOUS | Status: AC
  Administered 2021-10-31: 500 mg via INTRAVENOUS
  Filled 2021-10-30: qty 500

## 2021-10-30 MED ORDER — LACTATED RINGERS IV BOLUS (SEPSIS): 250.0000 mL | Freq: Once | INTRAVENOUS | Status: DC

## 2021-10-30 MED ORDER — LACTATED RINGERS IV BOLUS (SEPSIS)
1000.0000 mL | Freq: Once | INTRAVENOUS | Status: AC
  Administered 2021-10-30: 1000 mL via INTRAVENOUS

## 2021-10-30 MED ORDER — LEVOFLOXACIN IN D5W 750 MG/150ML IV SOLN
750.0000 mg | Freq: Once | INTRAVENOUS | Status: AC
  Administered 2021-10-30: 750 mg via INTRAVENOUS
  Filled 2021-10-30: qty 150

## 2021-10-30 MED ORDER — ACETAMINOPHEN 650 MG RE SUPP: 650.0000 mg | Freq: Four times a day (QID) | RECTAL | Status: DC | PRN

## 2021-10-30 MED ORDER — ENOXAPARIN SODIUM 40 MG/0.4ML IJ SOSY
40.0000 mg | PREFILLED_SYRINGE | INTRAMUSCULAR | Status: DC
  Administered 2021-10-30 – 2021-10-31 (×2): 40 mg via SUBCUTANEOUS
  Filled 2021-10-30 (×2): qty 0.4

## 2021-10-30 NOTE — Assessment & Plan Note (Signed)
Continue pantoprazole. °

## 2021-10-30 NOTE — Hospital Course (Addendum)
Emily Boyer is a 76 y.o. female with medical history significant for Hypertension and GERD who presents to the ED with nausea and dizziness, headaches, body aches and generalized malaise.  She has a nonproductive cough but denies shortness of breath or chest pain.  10/09: ED course and data review: BP 143/85, temp 103.2, pulse 117, respirations 20 with O2 sat 91% on room air.  Labs with WBC 13,000 and lactic acid 1.7 with Pro-Calc of 0.51.  Creatinine 1.08 slightly increased from baseline of 0.88.EKG, personally viewed and interpreted shows sinus tachycardia at 116 with nonspecific ST-T wave changes. CXR (+)increased interstitial lung markings bilaterally potentially reflecting atypical infection or mild interstitial edema. Patient started on sepsis fluids, IV Levaquin due to penicillin allergy and hospitalist consulted for admission. 10/10: afebrile, remains on supplemental O2. WBC trend up to 16.7. Cr improved some to 1.05.  Still meeting sepsis criteria with elevated WBC, tachypnea.   Consultants:  none  Procedures: none      ASSESSMENT & PLAN:   Principal Problem:   Sepsis ([place]) Active Problems:   CAP (community acquired pneumonia)   Gastroesophageal reflux disease   Hypertension   Community Acquired Pneumonia Sepsis due to pneumonia Sepsis criteria includes fever, tachycardia, tachypnea, pneumonia Continue sepsis fluids Continue IV azithromycin and ceftriaxone  Supplemental O2 if needed As needed DuoNebs, antitussives and supportive care  Gastroesophageal reflux disease Continue pantoprazole  Hypertension Holding home losartan/HCTZ for now d/t soft BP    DVT prophylaxis: lovenox Pertinent IV fluids/nutrition: LR 100 mL/h Central lines / invasive devices: none  Code Status: FULL CODE Family Communication: spoke to son on phone in patient's room on rounds.   Disposition: inpatient TOC needs: none at this time  Barriers to discharge / significant pending  items: pending clinical improvement sepsis/pneumonia, pending results blood cultures.

## 2021-10-30 NOTE — Consult Note (Signed)
PHARMACY -  BRIEF ANTIBIOTIC NOTE   Pharmacy has received consult(s) for levofloxacin from an ED provider.  The patient's profile has been reviewed for ht/wt/allergies/indication/available labs.    One time order(s) placed for: Levofloxacin '750mg'$  IV x1 in ED  Further antibiotics/pharmacy consults should be ordered by admitting physician if indicated.                       Thank you, Lorna Dibble 10/30/2021  1:07 AM

## 2021-10-30 NOTE — Plan of Care (Signed)
  Problem: Clinical Measurements: Goal: Diagnostic test results will improve Outcome: Progressing   Problem: Respiratory: Goal: Ability to maintain adequate ventilation will improve Outcome: Progressing   Problem: Activity: Goal: Risk for activity intolerance will decrease Outcome: Progressing

## 2021-10-30 NOTE — Assessment & Plan Note (Addendum)
Sepsis Sepsis criteria includes fever, tachycardia, pneumonia Continue sepsis fluids Continue IV Levaquin Supplemental O2 if needed As needed DuoNebs, antitussives and supportive care

## 2021-10-30 NOTE — ED Provider Notes (Signed)
[facility] Provider Note    Event Date/Time   First MD Initiated Contact with Patient 10/30/21 0032     (approximate)   History   Dizziness, Nausea, and Generalized Body Aches   HPI  Emily Boyer is a 76 y.o. female brought to the ED via EMS with a chief complaint of nausea and dizziness.  Patient has had sore throat, cough and congestion for several days.  Received Moderna COVID-vaccine booster last Thursday and has been feeling better ever since with headache, body aches and fatigue.  Endorses chills, N/V/D. Denies chest pain, shortness of breath, abdominal pain.     Past Medical History   Past Medical History:  Diagnosis Date   Allergy Sinus   Bone spur    right side of neck   Colitis    DDD (degenerative disc disease), cervical    Diverticulitis    GERD (gastroesophageal reflux disease)    Heart murmur    followed by PCP   HLD (hyperlipidemia)    Hypertension    Seasonal allergies      Active Problem List   Patient Active Problem List   Diagnosis Date Noted   Sepsis ([place]) 10/30/2021   H/O total hysterectomy 03/03/2020   DDD (degenerative disc disease), cervical 05/28/2018   Sebaceous cyst 12/20/2015   Abnormal findings-gastrointestinal tract    Encounter for general adult medical examination without abnormal findings 07/04/2014   Nerve root pain 07/04/2014   Gastroesophageal reflux disease 07/04/2014   HLD (hyperlipidemia) 07/04/2014   Hypertension 07/04/2014     Past Surgical History   Past Surgical History:  Procedure Laterality Date   BREAST BIOPSY Right 2004   neg   CATARACT EXTRACTION W/PHACO Right 04/29/2017   Procedure: CATARACT EXTRACTION PHACO AND INTRAOCULAR LENS PLACEMENT ([place]) RIGHT;  Surgeon: Eulogio Bear, MD;  Location: [place];  Service: Ophthalmology;  Laterality: Right;   CATARACT EXTRACTION W/PHACO Left 07/15/2017   Procedure: CATARACT EXTRACTION PHACO AND INTRAOCULAR LENS PLACEMENT  ([place]) LEFT;  Surgeon: Eulogio Bear, MD;  Location: [place];  Service: Ophthalmology;  Laterality: Left;   COLONOSCOPY WITH PROPOFOL N/A 02/06/2015   Procedure: COLONOSCOPY WITH PROPOFOL;  Surgeon: [name], MD;  Location: [place];  Service: Endoscopy;  Laterality: N/A;   EYE SURGERY  2019   TUBAL LIGATION  1980s   VAGINAL HYSTERECTOMY  1990s   one ovary removed as well     Home Medications   Prior to Admission medications   Medication Sig Start Date End Date Taking? Authorizing Provider  acetaminophen (TYLENOL) 500 MG tablet Take 500 mg by mouth every 6 (six) hours as needed.    [provider]  aspirin EC 81 MG tablet Take by mouth.    [provider]  atorvastatin (LIPITOR) 10 MG tablet Take 1 tablet (10 mg total) by mouth daily. 08/02/21   Juline Patch, MD  cholecalciferol (VITAMIN D) 1000 units tablet Take 1,000 Units by mouth daily.    [provider]  losartan-hydrochlorothiazide (HYZAAR) 50-12.5 MG tablet Take 1 tablet by mouth daily. 08/01/21   Juline Patch, MD  Multiple Vitamins-Minerals (CENTRUM SILVER 50+WOMEN) TABS Take by mouth.    [provider]  Omega 3 1000 MG CAPS Take 1 capsule by mouth daily.    [provider]  pantoprazole (PROTONIX) 40 MG tablet Take 1 tablet (40 mg total) by mouth daily. 08/01/21   Juline Patch, MD  triamcinolone (KENALOG) 0.1 %  Apply 1 application topically 2 (two) times daily. 01/31/20   Juline Patch, MD  Zinc Oxide-Vitamin C (ZINC PLUS VITAMIN C PO) Take 1 tablet by mouth daily.    [provider]     Allergies  Guaifenesin & derivatives, Methocarbamol, Penicillins, and Tramadol hcl   Family History   Family History  Problem Relation Age of Onset   Stroke Mother    Hypertension Mother    Arthritis Father    Heart disease Paternal Aunt    Cancer Paternal Grandmother    Stomach cancer Paternal Grandmother    Breast cancer Cousin        pat  cousin     Physical Exam  Triage Vital Signs: ED Triage Vitals  Enc Vitals Group     BP 10/29/21 1557 (!) 143/85     Pulse Rate 10/29/21 1557 (!) 117     Resp 10/29/21 1557 20     Temp 10/29/21 1557 (!) 103.2 F (39.6 C)     Temp Source 10/29/21 1557 Oral     SpO2 10/29/21 1557 91 %     Weight 10/29/21 1558 147 lb 11.3 oz (67 kg)     Height 10/29/21 1558 '5\' 1"'$  (1.549 m)     Head Circumference --      Peak Flow --      Pain Score 10/29/21 1558 6     Pain Loc --      Pain Edu? --      Excl. in [place]? --     Updated Vital Signs: BP 128/61 (BP Location: Left Arm)   Pulse 84   Temp 99.4 F (37.4 C) (Oral)   Resp 18   Ht '5\' 1"'$  (1.549 m)   Wt 67 kg   SpO2 94%   BMI 27.91 kg/m    General: Awake, no distress.  CV:  RRR.  Good peripheral perfusion.  Resp:  Normal effort.  Diminished aeration otherwise CTA B. Abd:  Nontender.  No distention.  Other:  Alert and oriented x3.  CN II toXII grossly intact.  5/5 motor strength and sensation all extremities.  MAE x4.   ED Results / Procedures / Treatments  Labs (all labs ordered are listed, but only abnormal results are displayed) Labs Reviewed  CBC WITH DIFFERENTIAL/PLATELET - Abnormal; Notable for the following components:      Result Value   WBC 13.3 (*)    Neutro Abs 11.4 (*)    All other components within normal limits  COMPREHENSIVE METABOLIC PANEL - Abnormal; Notable for the following components:   Potassium 3.4 (*)    Glucose, Bld 118 (*)    Creatinine, Ser 1.08 (*)    Total Protein 8.9 (*)    GFR, Estimated 54 (*)    All other components within normal limits  RESP PANEL BY RT-PCR (FLU A&B, COVID) ARPGX2  CULTURE, BLOOD (ROUTINE X 2)  CULTURE, BLOOD (ROUTINE X 2)  URINE CULTURE  LIPASE, BLOOD  LACTIC ACID, PLASMA  PROCALCITONIN  BRAIN NATRIURETIC PEPTIDE  URINALYSIS, ROUTINE W REFLEX MICROSCOPIC     EKG  ED ECG REPORT I, Denver Harder J, the attending physician, personally viewed and interpreted this  ECG.   Date: 10/30/2021  EKG Time: 1600  Rate: 116  Rhythm: sinus tachycardia  Axis: Normal  Intervals:none  ST&T Change: Nonspecific    RADIOLOGY I have independently visualized and interpreted patient's chest x-ray as well as noted the radiology interpretation:  Chest x-ray: Atypical infection versus mild interstitial edema  Official radiology report(s): DG Chest 2 View  Result Date: 10/29/2021 CLINICAL DATA:  Infection suspected. Headache, body aches, fatigue, nausea, and dizziness. EXAM: CHEST - 2 VIEW COMPARISON:  Chest/rib radiographs 01/09/2018 FINDINGS: The cardiomediastinal silhouette is within normal limits. Aortic atherosclerosis is noted. The interstitial markings are increased bilaterally, greatest in the left greater than right lung bases. No pleural effusion or pneumothorax is identified. No acute osseous abnormality is seen. IMPRESSION: Increased interstitial lung markings bilaterally, potentially reflecting atypical infection or mild interstitial edema. Electronically Signed   By: Logan Bores M.D.   On: 10/29/2021 16:40     PROCEDURES:  Critical Care performed: Yes, see critical care procedure note(s)  CRITICAL CARE Performed by: Paulette Blanch   Total critical care time: 30 minutes  Critical care time was exclusive of separately billable procedures and treating other patients.  Critical care was necessary to treat or prevent imminent or life-threatening deterioration.  Critical care was time spent personally by me on the following activities: development of treatment plan with patient and/or surrogate as well as nursing, discussions with consultants, evaluation of patient's response to treatment, examination of patient, obtaining history from patient or surrogate, ordering and performing treatments and interventions, ordering and review of laboratory studies, ordering and review of radiographic studies, pulse oximetry and re-evaluation of patient's  condition.   Marland Kitchen1-3 Lead EKG Interpretation  Performed by: Paulette Blanch, MD Authorized by: Paulette Blanch, MD     Interpretation: normal     ECG rate:  98   ECG rate assessment: normal     Rhythm: sinus rhythm     Ectopy: none     Conduction: normal   Comments:     Patient placed on cardiac monitor to evaluate for arrhythmias    MEDICATIONS ORDERED IN ED: Medications  lactated ringers bolus 1,000 mL (1,000 mLs Intravenous New Bag/Given 10/30/21 0101)    And  lactated ringers bolus 1,000 mL (1,000 mLs Intravenous New Bag/Given 10/30/21 0102)    And  lactated ringers bolus 250 mL (has no administration in time range)  levofloxacin (LEVAQUIN) IVPB 750 mg (750 mg Intravenous New Bag/Given 10/30/21 0106)  acetaminophen (TYLENOL) tablet 650 mg (650 mg Oral Given 10/29/21 1608)     IMPRESSION / MDM / ASSESSMENT AND PLAN / ED COURSE  I reviewed the triage vital signs and the nursing notes.                             76 year old female presenting with nausea, dizziness; meets sepsis criteria based on triage vital signs.  Differential diagnosis includes but is not limited to sepsis secondary to community acquired pneumonia, UTI, viral process, ACS, TIA, etc.  Personally reviewed patient's records and note her urgent care visit on 10/28/2021 for pharyngitis.  Patient's presentation is most consistent with acute presentation with potential threat to life or bodily function.  The patient is on the cardiac monitor to evaluate for evidence of arrhythmia and/or significant heart rate changes.  Laboratory results demonstrate moderate leukocytosis WBC 13.3, mild AKI creatinine 1.08 which is elevated from prior.  COVID and lactate negative; however, procalcitonin elevated.  Chest x-ray demonstrates pneumonia.  Initiate ED code sepsis protocol with 30 cc/kilo IV lactated Ringer's and 750 mg IV Levaquin for broad-spectrum IV antibiotics.  Patient placed on 2 L nasal cannula oxygen with improvement to  96%.  Currently afebrile after Tylenol administered at triage.  Will consult hospitalist services for evaluation and  admission.      FINAL CLINICAL IMPRESSION(S) / ED DIAGNOSES   Final diagnoses:  Sepsis, due to unspecified organism, unspecified whether acute organ dysfunction present [facility])  Community acquired pneumonia, unspecified laterality  Nausea  Dizziness  AKI (acute kidney injury) ([place])     Rx / DC Orders   ED Discharge Orders     None        Note:  This document was prepared using Dragon voice recognition software and may include unintentional dictation errors.   Paulette Blanch, MD 10/30/21 Areta Haber

## 2021-10-30 NOTE — Progress Notes (Signed)
PROGRESS NOTE    Emily Boyer   FGH:[id number] DOB: 03/08/1945  DOA: 10/30/2021 Date of Service: 10/30/21 PCP: Juline Patch, MD     Brief Narrative / Hospital Course:  Emily Boyer is a 76 y.o. female with medical history significant for Hypertension and GERD who presents to the ED with nausea and dizziness, headaches, body aches and generalized malaise.  She has a nonproductive cough but denies shortness of breath or chest pain.  10/09: ED course and data review: BP 143/85, temp 103.2, pulse 117, respirations 20 with O2 sat 91% on room air.  Labs with WBC 13,000 and lactic acid 1.7 with Pro-Calc of 0.51.  Creatinine 1.08 slightly increased from baseline of 0.88.EKG, personally viewed and interpreted shows sinus tachycardia at 116 with nonspecific ST-T wave changes. CXR (+)increased interstitial lung markings bilaterally potentially reflecting atypical infection or mild interstitial edema. Patient started on sepsis fluids, IV Levaquin due to penicillin allergy and hospitalist consulted for admission. 10/10: afebrile, remains on supplemental O2. WBC trend up to 16.7. Cr improved some to 1.05.  Still meeting sepsis criteria with elevated WBC, tachypnea.   Consultants:  none  Procedures: none      ASSESSMENT & PLAN:   Principal Problem:   Sepsis ([place]) Active Problems:   CAP (community acquired pneumonia)   Gastroesophageal reflux disease   Hypertension   Community Acquired Pneumonia Sepsis due to pneumonia Sepsis criteria includes fever, tachycardia, tachypnea, pneumonia Continue sepsis fluids Continue IV azithromycin and ceftriaxone  Supplemental O2 if needed As needed DuoNebs, antitussives and supportive care  Gastroesophageal reflux disease Continue pantoprazole  Hypertension Holding home losartan/HCTZ for now d/t soft BP    DVT prophylaxis: lovenox Pertinent IV fluids/nutrition: LR 100 mL/h Central lines / invasive devices: none  Code Status:  FULL CODE Family Communication: spoke to son on phone in patient's room on rounds.   Disposition: inpatient TOC needs: none at this time  Barriers to discharge / significant pending items: pending clinical improvement sepsis/pneumonia, pending results blood cultures.              Subjective:  Patient reports feeling somewhat better now in terms of her breathing compared to when she first came to the emergency department.  No CP.        Objective:  Vitals:   10/30/21 0945 10/30/21 1017 10/30/21 1115 10/30/21 1300  BP:   133/71 126/62  Pulse: 87  76 76  Resp: (!) 26  (!) 21 (!) 21  Temp:  98 F (36.7 C)    TempSrc:  Oral    SpO2: 92%  94% 93%  Weight:      Height:        Intake/Output Summary (Last 24 hours) at 10/30/2021 1339 Last data filed at 10/30/2021 1309 Gross per 24 hour  Intake 679.08 ml  Output --  Net 679.08 ml   Filed Weights   10/29/21 1558  Weight: 67 kg    Examination:  Constitutional:  VS as above General Appearance: alert, well-developed, well-nourished, NAD Neck: No masses, trachea midline Respiratory: Slightly increased respiratory effort respiratory rate 22-21 No wheeze No rhonchi No rales Cardiovascular: S1/S2 normal No murmur No rub/gallop auscultated No lower extremity edema Gastrointestinal: No tenderness Musculoskeletal:  No clubbing/cyanosis of digits Symmetrical movement in all extremities Neurological: No cranial nerve deficit on limited exam Alert Psychiatric: Normal judgment/insight Normal mood and affect       Scheduled Medications:   aspirin EC  81 mg Oral Daily  enoxaparin (LOVENOX) injection  40 mg Subcutaneous Q24H   pantoprazole  40 mg Oral Daily    Continuous Infusions:  [START ON 10/31/2021] azithromycin     [START ON 10/31/2021] cefTRIAXone (ROCEPHIN)  IV     lactated ringers     lactated ringers 100 mL/hr at 10/30/21 1309    PRN Medications:  acetaminophen **OR** acetaminophen,  HYDROcodone-acetaminophen, ondansetron **OR** ondansetron (ZOFRAN) IV  Antimicrobials:  Anti-infectives (From admission, onward)    Start     Dose/Rate Route Frequency Ordered Stop   10/31/21 0200  cefTRIAXone (ROCEPHIN) 2 g in sodium chloride 0.9 % 100 mL IVPB        2 g 200 mL/hr over 30 Minutes Intravenous Every 24 hours 10/30/21 0216 11/05/21 0159   10/31/21 0200  azithromycin (ZITHROMAX) 500 mg in sodium chloride 0.9 % 250 mL IVPB        500 mg 250 mL/hr over 60 Minutes Intravenous Every 24 hours 10/30/21 0216 11/05/21 0159   10/30/21 0045  levofloxacin (LEVAQUIN) IVPB 750 mg        750 mg 100 mL/hr over 90 Minutes Intravenous  Once 10/30/21 0034 10/30/21 0246       Data Reviewed: I have personally reviewed following labs and imaging studies  CBC: Recent Labs  Lab 10/29/21 1528 10/30/21 0452  WBC 13.3* 16.7*  NEUTROABS 11.4*  --   HGB 13.1 10.6*  HCT 39.5 32.0*  MCV 85.9 87.0  PLT 269 585   Basic Metabolic Panel: Recent Labs  Lab 10/29/21 1528 10/30/21 0452  NA 138  --   K 3.4*  --   CL 103  --   CO2 26  --   GLUCOSE 118*  --   BUN 19  --   CREATININE 1.08* 1.05*  CALCIUM 9.7  --    GFR: Estimated Creatinine Clearance: 40.6 mL/min (A) (by C-G formula based on SCr of 1.05 mg/dL (H)). Liver Function Tests: Recent Labs  Lab 10/29/21 1528  AST 40  ALT 31  ALKPHOS 98  BILITOT 0.9  PROT 8.9*  ALBUMIN 4.4   Recent Labs  Lab 10/29/21 1528  LIPASE 27   No results for input(s): "AMMONIA" in the last 168 hours. Coagulation Profile: Recent Labs  Lab 10/30/21 0452  INR 1.2   Cardiac Enzymes: No results for input(s): "CKTOTAL", "CKMB", "CKMBINDEX", "TROPONINI" in the last 168 hours. BNP (last 3 results) No results for input(s): "PROBNP" in the last 8760 hours. HbA1C: No results for input(s): "HGBA1C" in the last 72 hours. CBG: No results for input(s): "GLUCAP" in the last 168 hours. Lipid Profile: No results for input(s): "CHOL", "HDL",  "LDLCALC", "TRIG", "CHOLHDL", "LDLDIRECT" in the last 72 hours. Thyroid Function Tests: No results for input(s): "TSH", "T4TOTAL", "FREET4", "T3FREE", "THYROIDAB" in the last 72 hours. Anemia Panel: No results for input(s): "VITAMINB12", "FOLATE", "FERRITIN", "TIBC", "IRON", "RETICCTPCT" in the last 72 hours. Urine analysis:    Component Value Date/Time   COLORURINE YELLOW (A) 10/30/2021 0249   APPEARANCEUR HAZY (A) 10/30/2021 0249   LABSPEC 1.017 10/30/2021 0249   PHURINE 5.0 10/30/2021 0249   GLUCOSEU NEGATIVE 10/30/2021 0249   HGBUR NEGATIVE 10/30/2021 0249   BILIRUBINUR NEGATIVE 10/30/2021 0249   BILIRUBINUR negative 11/30/2019 0915   KETONESUR NEGATIVE 10/30/2021 0249   PROTEINUR 30 (A) 10/30/2021 0249   UROBILINOGEN 0.2 11/30/2019 0915   NITRITE NEGATIVE 10/30/2021 0249   LEUKOCYTESUR TRACE (A) 10/30/2021 0249   Sepsis Labs: '@LABRCNTIP'$ (procalcitonin:4,lacticidven:4)  Recent Results (from the past 240 hour(s))  Group A Strep by PCR     Status: None   Collection Time: 10/28/21 11:59 AM   Specimen: Throat; Sterile Swab  Result Value Ref Range Status   Group A Strep by PCR NOT DETECTED NOT DETECTED Final    Comment: Performed at [facility], [address] [place], [place] [address]  Resp Panel by RT-PCR (Flu A&B, Covid) Anterior Nasal Swab     Status: None   Collection Time: 10/29/21  4:02 PM   Specimen: Anterior Nasal Swab  Result Value Ref Range Status   SARS Coronavirus 2 by RT PCR NEGATIVE NEGATIVE Final    Comment: (NOTE) SARS-CoV-2 target nucleic acids are NOT DETECTED.  The SARS-CoV-2 RNA is generally detectable in upper respiratory specimens during the acute phase of infection. The lowest concentration of SARS-CoV-2 viral copies this assay can detect is 138 copies/mL. A negative result does not preclude SARS-Cov-2 infection and should not be used as the sole basis for treatment or other patient management decisions. A negative result may occur  with  improper specimen collection/handling, submission of specimen other than nasopharyngeal swab, presence of viral mutation(s) within the areas targeted by this assay, and inadequate number of viral copies(<138 copies/mL). A negative result must be combined with clinical observations, patient history, and epidemiological information. The expected result is Negative.  Fact Sheet for Patients:  EntrepreneurPulse.com.au  Fact Sheet for Healthcare Providers:  IncredibleEmployment.be  This test is no t yet approved or cleared by the [place] FDA and  has been authorized for detection and/or diagnosis of SARS-CoV-2 by FDA under an Emergency Use Authorization (EUA). This EUA will remain  in effect (meaning this test can be used) for the duration of the COVID-19 declaration under Section 564(b)(1) of the Act, 21 U.S.C.section 360bbb-3(b)(1), unless the authorization is terminated  or revoked sooner.       Influenza A by PCR NEGATIVE NEGATIVE Final   Influenza B by PCR NEGATIVE NEGATIVE Final    Comment: (NOTE) The Xpert Xpress SARS-CoV-2/FLU/RSV plus assay is intended as an aid in the diagnosis of influenza from Nasopharyngeal swab specimens and should not be used as a sole basis for treatment. Nasal washings and aspirates are unacceptable for Xpert Xpress SARS-CoV-2/FLU/RSV testing.  Fact Sheet for Patients: EntrepreneurPulse.com.au  Fact Sheet for Healthcare Providers: IncredibleEmployment.be  This test is not yet approved or cleared by the [place] FDA and has been authorized for detection and/or diagnosis of SARS-CoV-2 by FDA under an Emergency Use Authorization (EUA). This EUA will remain in effect (meaning this test can be used) for the duration of the COVID-19 declaration under Section 564(b)(1) of the Act, 21 U.S.C. section 360bbb-3(b)(1), unless the authorization is terminated  or revoked.  Performed at [facility], [address] [place], [place] [address]   Culture, blood (routine x 2)     Status: None (Preliminary result)   Collection Time: 10/29/21  4:05 PM   Specimen: BLOOD  Result Value Ref Range Status   Specimen Description BLOOD LEFT FOREARM  Final   Special Requests   Final    BOTTLES DRAWN AEROBIC AND ANAEROBIC Blood Culture adequate volume   Culture   Final    NO GROWTH < 12 HOURS Performed at [facility], [address] [place], [place] [address]    Report Status PENDING  Incomplete  Culture, blood (routine x 2)     Status: None (Preliminary result)   Collection Time: 10/30/21 12:58 AM   Specimen: BLOOD  Result Value Ref Range Status   Specimen Description BLOOD RIGHT ANTECUBITAL  Final   Special Requests   Final    BOTTLES DRAWN AEROBIC AND ANAEROBIC Blood Culture results may not be optimal due to an excessive volume of blood received in culture bottles   Culture   Final    NO GROWTH < 12 HOURS Performed at [facility], [address], [place], [place] [address]    Report Status PENDING  Incomplete         Radiology Studies: No results found.          LOS: 0 days       Emeterio Reeve, DO [facility] 10/30/2021, 1:39 PM   Staff may message me via secure chat in [place]  but this may not receive immediate response,  please page for urgent matters!  If 7PM-7AM, please contact night-coverage [email or web address]  Dictation software was used to generate the above note. Typos may occur and escape review, as with typed/written notes. Please contact Dr Sheppard Coil directly for clarity if needed.

## 2021-10-30 NOTE — ED Notes (Signed)
Pt has been coughing up mucus. Pt wearing [place] 1lpm O2 and is satting around 95%. Pt given tylenol due to headache. Pt provided with pillows, bouffant cap, and more blankets.

## 2021-10-30 NOTE — ED Notes (Signed)
Patient ambulated to the bathroom. Pure Wick placed after returning to bed.

## 2021-10-30 NOTE — Assessment & Plan Note (Signed)
Continue losartan/HCTZ

## 2021-10-30 NOTE — ED Notes (Signed)
Patient ambulated undependably to restroom without assistance. Call light within reach. No needs expressed to RN at this time.

## 2021-10-30 NOTE — ED Notes (Signed)
Patient ambulated to bathroom. Linen slightly soiled. Bedding changed prior to ambulation back to bed.

## 2021-10-30 NOTE — Sepsis Progress Note (Signed)
Elink monitoring for the code sepsis protocol.  

## 2021-10-30 NOTE — Progress Notes (Signed)
       CROSS COVER NOTE  NAME: Emily Boyer MRN: [id number] DOB : [birth year]    Date of Service   10/30/2021   HPI/Events of Note   Medication request received for cough preventing patient from sleeping.  Interventions   Assessment/Plan:  Tessalon pearls     This document was prepared using Systems analyst and may include unintentional dictation errors.  Neomia Glass DNP, MBA, FNP-BC Nurse Practitioner [facility] Pager [phone]

## 2021-10-30 NOTE — H&P (Signed)
History and Physical    Patient: Emily Boyer ELF:[id number] DOB: December 28, 1945 DOA: 10/30/2021 DOS: the patient was seen and examined on 10/30/2021 PCP: Juline Patch, MD  Patient coming from: Home  Chief Complaint:  Chief Complaint  Patient presents with   Dizziness   Nausea   Generalized Body Aches    HPI: Emily Boyer is a 76 y.o. female with medical history significant for Hypertension and GERD who presents to the ED with nausea and dizziness, headaches, body aches and generalized malaise.  She has a nonproductive cough but denies shortness of breath or chest pain.  States she received her COVID shot 4 days ago.  She denies vomiting, diarrhea or abdominal pain and denies dysuria ED course and data review: BP 143/85, temp 103.2, pulse 117, respirations 20 with O2 sat 91% on room air.  Labs with WBC 13,000 and lactic acid 1.7 with Pro-Calc of [id number]  Creatinine 1.08 slightly increased from baseline of 0.88.EKG, personally viewed and interpreted shows sinus tachycardia at 116 with nonspecific ST-T wave changes Chest x-ray shows increased interstitial lung markings bilaterally potentially reflecting atypical infection or mild interstitial edema. Patient started on sepsis fluids, IV Levaquin due to penicillin allergy and hospitalist consulted for admission.    Review of Systems: As mentioned in the history of present illness. All other systems reviewed and are negative.  Past Medical History:  Diagnosis Date   Allergy Sinus   Bone spur    right side of neck   Colitis    DDD (degenerative disc disease), cervical    Diverticulitis    GERD (gastroesophageal reflux disease)    Heart murmur    followed by PCP   HLD (hyperlipidemia)    Hypertension    Seasonal allergies    Past Surgical History:  Procedure Laterality Date   BREAST BIOPSY Right 2004   neg   CATARACT EXTRACTION W/PHACO Right 04/29/2017   Procedure: CATARACT EXTRACTION PHACO AND INTRAOCULAR LENS PLACEMENT  ([place]) RIGHT;  Surgeon: Eulogio Bear, MD;  Location: [place];  Service: Ophthalmology;  Laterality: Right;   CATARACT EXTRACTION W/PHACO Left 07/15/2017   Procedure: CATARACT EXTRACTION PHACO AND INTRAOCULAR LENS PLACEMENT ([place]) LEFT;  Surgeon: Eulogio Bear, MD;  Location: [place];  Service: Ophthalmology;  Laterality: Left;   COLONOSCOPY WITH PROPOFOL N/A 02/06/2015   Procedure: COLONOSCOPY WITH PROPOFOL;  Surgeon: [name], MD;  Location: [place];  Service: Endoscopy;  Laterality: N/A;   EYE SURGERY  2019   TUBAL LIGATION  1980s   VAGINAL HYSTERECTOMY  1990s   one ovary removed as well   Social History:  reports that she quit smoking about 9 years ago. Her smoking use included cigarettes and e-cigarettes. She has a 25.00 pack-year smoking history. She has never used smokeless tobacco. She reports current alcohol use of about 2.0 standard drinks of alcohol per week. She reports that she does not use drugs.  Allergies  Allergen Reactions   Guaifenesin & Derivatives    Methocarbamol Itching   Penicillins Itching   Tramadol Hcl Itching    Family History  Problem Relation Age of Onset   Stroke Mother    Hypertension Mother    Arthritis Father    Heart disease Paternal Aunt    Cancer Paternal Grandmother    Stomach cancer Paternal Grandmother    Breast cancer Cousin        pat cousin    Prior to Admission medications   Medication Sig Start  Date End Date Taking? Authorizing Provider  aspirin EC 81 MG tablet Take by mouth.   Yes [provider]  cholecalciferol (VITAMIN D) 1000 units tablet Take 1,000 Units by mouth daily.   Yes [provider]  losartan-hydrochlorothiazide (HYZAAR) 50-12.5 MG tablet Take 1 tablet by mouth daily. 08/01/21  Yes Juline Patch, MD  Multiple Vitamins-Minerals (CENTRUM SILVER 50+WOMEN) TABS Take by mouth.   Yes [provider]  Omega 3 1000 MG CAPS Take 1 capsule by mouth daily.    Yes [provider]  pantoprazole (PROTONIX) 40 MG tablet Take 1 tablet (40 mg total) by mouth daily. 08/01/21  Yes Juline Patch, MD  Zinc Oxide-Vitamin C (ZINC PLUS VITAMIN C PO) Take 1 tablet by mouth daily.   Yes [provider]  acetaminophen (TYLENOL) 500 MG tablet Take 500 mg by mouth every 6 (six) hours as needed.    [provider]  atorvastatin (LIPITOR) 10 MG tablet Take 1 tablet (10 mg total) by mouth daily. Patient not taking: Reported on 10/30/2021 08/02/21   Juline Patch, MD  triamcinolone (KENALOG) 0.1 % Apply 1 application topically 2 (two) times daily. Patient not taking: Reported on 10/30/2021 01/31/20   Juline Patch, MD    Physical Exam: Vitals:   10/29/21 1557 10/29/21 1558 10/29/21 2120 10/29/21 2330  BP: (!) 143/85  115/62 128/61  Pulse: (!) 117  92 84  Resp: '20  18 18  '$ Temp: (!) 103.2 F (39.6 C)  98.6 F (37 C) 99.4 F (37.4 C)  TempSrc: Oral  Oral Oral  SpO2: 91%  93% 94%  Weight:  67 kg    Height:  '5\' 1"'$  (1.549 m)     Physical Exam Vitals and nursing note reviewed.  Constitutional:      General: She is not in acute distress. HENT:     Head: Normocephalic and atraumatic.  Cardiovascular:     Rate and Rhythm: Regular rhythm. Tachycardia present.     Heart sounds: Normal heart sounds.  Pulmonary:     Effort: Pulmonary effort is normal.     Breath sounds: Normal breath sounds.  Abdominal:     Palpations: Abdomen is soft.     Tenderness: There is no abdominal tenderness.  Neurological:     Mental Status: Mental status is at baseline.     Labs on Admission: I have personally reviewed following labs and imaging studies  CBC: Recent Labs  Lab 10/29/21 1528  WBC 13.3*  NEUTROABS 11.4*  HGB 13.1  HCT 39.5  MCV 85.9  PLT 962   Basic Metabolic Panel: Recent Labs  Lab 10/29/21 1528  NA 138  K 3.4*  CL 103  CO2 26  GLUCOSE 118*  BUN 19  CREATININE 1.08*  CALCIUM 9.7   GFR: Estimated Creatinine  Clearance: 39.4 mL/min (A) (by C-G formula based on SCr of 1.08 mg/dL (H)). Liver Function Tests: Recent Labs  Lab 10/29/21 1528  AST 40  ALT 31  ALKPHOS 98  BILITOT 0.9  PROT 8.9*  ALBUMIN 4.4   Recent Labs  Lab 10/29/21 1528  LIPASE 27   No results for input(s): "AMMONIA" in the last 168 hours. Coagulation Profile: No results for input(s): "INR", "PROTIME" in the last 168 hours. Cardiac Enzymes: No results for input(s): "CKTOTAL", "CKMB", "CKMBINDEX", "TROPONINI" in the last 168 hours. BNP (last 3 results) No results for input(s): "PROBNP" in the last 8760 hours. HbA1C: No results for input(s): "HGBA1C" in the last 72  hours. CBG: No results for input(s): "GLUCAP" in the last 168 hours. Lipid Profile: No results for input(s): "CHOL", "HDL", "LDLCALC", "TRIG", "CHOLHDL", "LDLDIRECT" in the last 72 hours. Thyroid Function Tests: No results for input(s): "TSH", "T4TOTAL", "FREET4", "T3FREE", "THYROIDAB" in the last 72 hours. Anemia Panel: No results for input(s): "VITAMINB12", "FOLATE", "FERRITIN", "TIBC", "IRON", "RETICCTPCT" in the last 72 hours. Urine analysis:    Component Value Date/Time   COLORURINE YELLOW 12/07/2014 [place] 12/07/2014 1403   LABSPEC <1.005 (L) 12/07/2014 1403   PHURINE 6.0 12/07/2014 1403   GLUCOSEU NEGATIVE 12/07/2014 1403   HGBUR NEGATIVE 12/07/2014 1403   BILIRUBINUR negative 11/30/2019 0915   KETONESUR NEGATIVE 12/07/2014 1403   PROTEINUR Negative 11/30/2019 0915   PROTEINUR NEGATIVE 12/07/2014 1403   UROBILINOGEN 0.2 11/30/2019 0915   NITRITE negative 11/30/2019 0915   NITRITE NEGATIVE 12/07/2014 1403   LEUKOCYTESUR Negative 11/30/2019 0915    Radiological Exams on Admission: DG Chest 2 View  Result Date: 10/29/2021 CLINICAL DATA:  Infection suspected. Headache, body aches, fatigue, nausea, and dizziness. EXAM: CHEST - 2 VIEW COMPARISON:  Chest/rib radiographs 01/09/2018 FINDINGS: The cardiomediastinal silhouette is  within normal limits. Aortic atherosclerosis is noted. The interstitial markings are increased bilaterally, greatest in the left greater than right lung bases. No pleural effusion or pneumothorax is identified. No acute osseous abnormality is seen. IMPRESSION: Increased interstitial lung markings bilaterally, potentially reflecting atypical infection or mild interstitial edema. Electronically Signed   By: Logan Bores M.D.   On: 10/29/2021 16:40     Data Reviewed: Relevant notes from primary care and specialist visits, past discharge summaries as available in EHR, including Care Everywhere. Prior diagnostic testing as pertinent to current admission diagnoses Updated medications and problem lists for reconciliation ED course, including vitals, labs, imaging, treatment and response to treatment Triage notes, nursing and pharmacy notes and ED provider's notes Notable results as noted in HPI   Assessment and Plan: Pneumonia Sepsis Sepsis criteria includes fever, tachycardia, pneumonia Continue sepsis fluids Continue IV Levaquin Supplemental O2 if needed As needed DuoNebs, antitussives and supportive care  Hypertension Continue losartan/HCTZ  Gastroesophageal reflux disease Continue pantoprazole        DVT prophylaxis: Lovenox  Consults: none  Advance Care Planning: dull code  Family Communication: Daughter at bedside  Disposition Plan: Back to previous home environment  Severity of Illness: The appropriate patient status for this patient is INPATIENT. Inpatient status is judged to be reasonable and necessary in order to provide the required intensity of service to ensure the patient's safety. The patient's presenting symptoms, physical exam findings, and initial radiographic and laboratory data in the context of their chronic comorbidities is felt to place them at high risk for further clinical deterioration. Furthermore, it is not anticipated that the patient will be  medically stable for discharge from the hospital within 2 midnights of admission.   * I certify that at the point of admission it is my clinical judgment that the patient will require inpatient hospital care spanning beyond 2 midnights from the point of admission due to high intensity of service, high risk for further deterioration and high frequency of surveillance required.*  Author: Athena Masse, MD 10/30/2021 1:53 AM  For on call review [email or web address].

## 2021-10-31 DIAGNOSIS — J189 Pneumonia, unspecified organism: Secondary | ICD-10-CM

## 2021-10-31 DIAGNOSIS — A419 Sepsis, unspecified organism: Secondary | ICD-10-CM | POA: Diagnosis not present

## 2021-10-31 LAB — BASIC METABOLIC PANEL
Anion gap: 11 (ref 5–15)
BUN: 13 mg/dL (ref 8–23)
CO2: 26 mmol/L (ref 22–32)
Calcium: 8.7 mg/dL — ABNORMAL LOW (ref 8.9–10.3)
Chloride: 104 mmol/L (ref 98–111)
Creatinine, Ser: 0.87 mg/dL (ref 0.44–1.00)
GFR, Estimated: >60 mL/min (ref >60)
Glucose, Bld: 85 mg/dL (ref 70–99)
Potassium: 3.4 mmol/L — ABNORMAL LOW (ref 3.5–5.1)
Sodium: 141 mmol/L (ref 135–145)

## 2021-10-31 LAB — URINE CULTURE: Culture: NO GROWTH

## 2021-10-31 LAB — CBC
HCT: 31.3 % — ABNORMAL LOW (ref 36.0–46.0)
Hemoglobin: 10.3 g/dL — ABNORMAL LOW (ref 12.0–15.0)
MCH: 28.9 pg (ref 26.0–34.0)
MCHC: 32.9 g/dL (ref 30.0–36.0)
MCV: 87.7 fL (ref 80.0–100.0)
Platelets: 257 10*3/uL (ref 150–400)
RBC: 3.57 MIL/uL — ABNORMAL LOW (ref 3.87–5.11)
RDW: 14 % (ref 11.5–15.5)
WBC: 14.5 10*3/uL — ABNORMAL HIGH (ref 4.0–10.5)
nRBC: 0 % (ref 0.0–0.2)

## 2021-10-31 MED ORDER — RISAQUAD PO CAPS
1.0000 | ORAL_CAPSULE | Freq: Every day | ORAL | Status: DC
  Administered 2021-10-31: 1 via ORAL
  Filled 2021-10-31: qty 1

## 2021-10-31 MED ORDER — AZITHROMYCIN 500 MG PO TABS: 500.0000 mg | ORAL_TABLET | Freq: Every day | ORAL | 0 refills | Status: AC

## 2021-10-31 MED ORDER — LOSARTAN POTASSIUM 50 MG PO TABS
50.0000 mg | ORAL_TABLET | Freq: Every day | ORAL | Status: DC
  Administered 2021-10-31: 50 mg via ORAL
  Filled 2021-10-31: qty 1

## 2021-10-31 MED ORDER — AZITHROMYCIN 500 MG PO TABS: 500.0000 mg | ORAL_TABLET | Freq: Every day | ORAL | Status: DC

## 2021-10-31 MED ORDER — BENZONATATE 200 MG PO CAPS: 200.0000 mg | ORAL_CAPSULE | Freq: Three times a day (TID) | ORAL | 0 refills | Status: DC | PRN

## 2021-10-31 MED ORDER — POTASSIUM CHLORIDE CRYS ER 20 MEQ PO TBCR
40.0000 meq | EXTENDED_RELEASE_TABLET | Freq: Once | ORAL | Status: AC
  Administered 2021-10-31: 40 meq via ORAL
  Filled 2021-10-31: qty 2

## 2021-10-31 MED ORDER — CEFUROXIME AXETIL 500 MG PO TABS: 500.0000 mg | ORAL_TABLET | Freq: Two times a day (BID) | ORAL | 0 refills | Status: AC

## 2021-10-31 NOTE — Progress Notes (Signed)
Discharge instructions reviewed with patient including followup visits and new medications.  Understanding was verbalized and all questions were answered.  IV removed without complication; patient tolerated well.  Patient discharged home via wheelchair in stable condition escorted by volunteer staff.  

## 2021-10-31 NOTE — Plan of Care (Signed)
  Problem: Fluid Volume: Goal: Hemodynamic stability will improve 10/31/2021 1058 by Derrek Monaco, RN Outcome: Adequate for Discharge 10/31/2021 1057 by Derrek Monaco, RN Outcome: Progressing   Problem: Clinical Measurements: Goal: Diagnostic test results will improve 10/31/2021 1058 by Derrek Monaco, RN Outcome: Adequate for Discharge 10/31/2021 1057 by Derrek Monaco, RN Outcome: Progressing Goal: Signs and symptoms of infection will decrease 10/31/2021 1058 by Derrek Monaco, RN Outcome: Adequate for Discharge 10/31/2021 1057 by Derrek Monaco, RN Outcome: Progressing   Problem: Respiratory: Goal: Ability to maintain adequate ventilation will improve 10/31/2021 1058 by Derrek Monaco, RN Outcome: Adequate for Discharge 10/31/2021 1057 by Derrek Monaco, RN Outcome: Progressing   Problem: Education: Goal: Knowledge of General Education information will improve Description: Including pain rating scale, medication(s)/side effects and non-pharmacologic comfort measures 10/31/2021 1058 by Derrek Monaco, RN Outcome: Adequate for Discharge 10/31/2021 1057 by Derrek Monaco, RN Outcome: Progressing   Problem: Health Behavior/Discharge Planning: Goal: Ability to manage health-related needs will improve 10/31/2021 1058 by Derrek Monaco, RN Outcome: Adequate for Discharge 10/31/2021 1057 by Derrek Monaco, RN Outcome: Progressing   Problem: Clinical Measurements: Goal: Ability to maintain clinical measurements within normal limits will improve 10/31/2021 1058 by Derrek Monaco, RN Outcome: Adequate for Discharge 10/31/2021 1057 by Derrek Monaco, RN Outcome: Progressing Goal: Will remain free from infection 10/31/2021 1058 by Derrek Monaco, RN Outcome: Adequate for Discharge 10/31/2021 1057 by Derrek Monaco, RN Outcome: Progressing Goal: Diagnostic test results will improve 10/31/2021 1058 by Derrek Monaco, RN Outcome: Adequate for Discharge 10/31/2021 1057 by Derrek Monaco, RN Outcome: Progressing Goal: Respiratory complications will improve 10/31/2021 1058 by Derrek Monaco, RN Outcome: Adequate for Discharge 10/31/2021 1057 by Derrek Monaco, RN Outcome: Progressing Goal: Cardiovascular complication will be avoided 10/31/2021 1058 by Derrek Monaco, RN Outcome: Adequate for Discharge 10/31/2021 1057 by Derrek Monaco, RN Outcome: Progressing   Problem: Activity: Goal: Risk for activity intolerance will decrease 10/31/2021 1058 by Derrek Monaco, RN Outcome: Adequate for Discharge 10/31/2021 1057 by Derrek Monaco, RN Outcome: Progressing   Problem: Nutrition: Goal: Adequate nutrition will be maintained 10/31/2021 1058 by Derrek Monaco, RN Outcome: Adequate for Discharge 10/31/2021 1057 by Derrek Monaco, RN Outcome: Progressing   Problem: Coping: Goal: Level of anxiety will decrease 10/31/2021 1058 by Derrek Monaco, RN Outcome: Adequate for Discharge 10/31/2021 1057 by Derrek Monaco, RN Outcome: Progressing   Problem: Elimination: Goal: Will not experience complications related to bowel motility 10/31/2021 1058 by Derrek Monaco, RN Outcome: Adequate for Discharge 10/31/2021 1057 by Derrek Monaco, RN Outcome: Progressing Goal: Will not experience complications related to urinary retention 10/31/2021 1058 by Derrek Monaco, RN Outcome: Adequate for Discharge 10/31/2021 1057 by Derrek Monaco, RN Outcome: Progressing   Problem: Pain Managment: Goal: General experience of comfort will improve 10/31/2021 1058 by Derrek Monaco, RN Outcome: Adequate for Discharge 10/31/2021 1057 by Derrek Monaco, RN Outcome: Progressing   Problem: Safety: Goal: Ability to remain free from injury will improve 10/31/2021 1058 by Derrek Monaco, RN Outcome: Adequate for Discharge 10/31/2021  1057 by Derrek Monaco, RN Outcome: Progressing   Problem: Skin Integrity: Goal: Risk for impaired skin integrity will decrease 10/31/2021 1058 by Derrek Monaco, RN Outcome: Adequate for Discharge 10/31/2021 1057 by Derrek Monaco, RN Outcome: Progressing

## 2021-10-31 NOTE — Discharge Summary (Addendum)
Emily Boyer FYB:[id number] DOB: 1945/07/27 DOA: 10/30/2021  PCP: Juline Patch, MD  Admit date: 10/30/2021 Discharge date: 10/31/2021  Admitted From: Home Disposition: Home  Recommendations for Outpatient Follow-up:  Follow up with PCP in 1 week Please obtain BMP/CBC in one week      Discharge Condition:Stable CODE STATUS: Full Diet recommendation: Heart Healthy  Brief/Interim Summary: Per NID:POEUM Emily Boyer is a 76 y.o. female with medical history significant for Hypertension and GERD who presents to the ED with nausea and dizziness, headaches, body aches and generalized malaise.  She has a nonproductive cough but denies shortness of breath or chest pain.  She was found with pneumonia and sepsis due to community-acquired pneumonia..  Current IV antibiotics.  She is stable for discharge with transition to p.o. antibiotics.      Discharge Diagnoses:  Principal Problem:   Sepsis due to pneumonia [facility]) Active Problems:   CAP (community acquired pneumonia)   Gastroesophageal reflux disease   Hypertension    Discharge Instructions  Discharge Instructions     Diet - low sodium heart healthy   Complete by: As directed    Discharge instructions   Complete by: As directed    Take blood pressure starting tomorrow.  Keep hydrated F/u with pcp Start antibiotic tomorrow.   Increase activity slowly   Complete by: As directed       Allergies as of 10/31/2021       Reactions   Guaifenesin & Derivatives    Methocarbamol Itching   Penicillins Itching   Tramadol Hcl Itching        Medication List     STOP taking these medications    atorvastatin 10 MG tablet Commonly known as: LIPITOR   triamcinolone cream 0.1 % Commonly known as: KENALOG       TAKE these medications    acetaminophen 500 MG tablet Commonly known as: TYLENOL Take 500 mg by mouth every 6 (six) hours as needed.   aspirin EC 81 MG tablet Take by mouth.   azithromycin 500 MG  tablet Commonly known as: ZITHROMAX Take 1 tablet (500 mg total) by mouth daily for 3 days.   benzonatate 200 MG capsule Commonly known as: TESSALON Take 1 capsule (200 mg total) by mouth 3 (three) times daily as needed for up to 7 days for cough.   cefUROXime 500 MG tablet Commonly known as: CEFTIN Take 1 tablet (500 mg total) by mouth 2 (two) times daily with a meal for 3 days.   Centrum Silver 50+Women Tabs Take by mouth.   cholecalciferol 1000 units tablet Commonly known as: VITAMIN D Take 1,000 Units by mouth daily.   losartan-hydrochlorothiazide 50-12.5 MG tablet Commonly known as: HYZAAR Take 1 tablet by mouth daily.   Omega 3 1000 MG Caps Take 1 capsule by mouth daily.   pantoprazole 40 MG tablet Commonly known as: PROTONIX Take 1 tablet (40 mg total) by mouth daily.   ZINC PLUS VITAMIN C PO Take 1 tablet by mouth daily.        Follow-up Information     Juline Patch, MD Follow up in 1 week(s).   Specialty: Family Medicine Contact information: [address] [place] [address] [phone]                Allergies  Allergen Reactions   Guaifenesin & Derivatives    Methocarbamol Itching   Penicillins Itching   Tramadol Hcl Itching    Consultations:    Procedures/Studies:  DG Chest 2 View  Result Date: 10/29/2021 CLINICAL DATA:  Infection suspected. Headache, body aches, fatigue, nausea, and dizziness. EXAM: CHEST - 2 VIEW COMPARISON:  Chest/rib radiographs 01/09/2018 FINDINGS: The cardiomediastinal silhouette is within normal limits. Aortic atherosclerosis is noted. The interstitial markings are increased bilaterally, greatest in the left greater than right lung bases. No pleural effusion or pneumothorax is identified. No acute osseous abnormality is seen. IMPRESSION: Increased interstitial lung markings bilaterally, potentially reflecting atypical infection or mild interstitial edema. Electronically Signed   By: Logan Bores  M.D.   On: 10/29/2021 16:40      Subjective: Feels better.  Shortness of breath has improved.  No chest pain  Discharge Exam: Vitals:   10/30/21 2039 10/31/21 0744  BP: (!) 159/70 (!) 160/85  Pulse: 79 81  Resp: 20   Temp: 99.9 F (37.7 C) 98.2 F (36.8 C)  SpO2: 96% 100%   Vitals:   10/30/21 1730 10/30/21 1839 10/30/21 2039 10/31/21 0744  BP: (!) 171/81 (!) 178/84 (!) 159/70 (!) 160/85  Pulse: 86 87 79 81  Resp: (!) '22 16 20   '$ Temp:  99.7 F (37.6 C) 99.9 F (37.7 C) 98.2 F (36.8 C)  TempSrc:    Oral  SpO2: 96% 95% 96% 100%  Weight:      Height:        General: Pt is alert, awake, not in acute distress Cardiovascular: RRR, S1/S2 +, no rubs, no gallops Respiratory: CTA bilaterally, no wheezing, no rhonchi Abdominal: Soft, NT, ND, bowel sounds + Extremities: no edema, no cyanosis    The results of significant diagnostics from this hospitalization (including imaging, microbiology, ancillary and laboratory) are listed below for reference.     Microbiology: Recent Results (from the past 240 hour(s))  Group A Strep by PCR     Status: None   Collection Time: 10/28/21 11:59 AM   Specimen: Throat; Sterile Swab  Result Value Ref Range Status   Group A Strep by PCR NOT DETECTED NOT DETECTED Final    Comment: Performed at [facility], [address], [place], [place] [address]  Resp Panel by RT-PCR (Flu A&B, Covid) Anterior Nasal Swab     Status: None   Collection Time: 10/29/21  4:02 PM   Specimen: Anterior Nasal Swab  Result Value Ref Range Status   SARS Coronavirus 2 by RT PCR NEGATIVE NEGATIVE Final    Comment: (NOTE) SARS-CoV-2 target nucleic acids are NOT DETECTED.  The SARS-CoV-2 RNA is generally detectable in upper respiratory specimens during the acute phase of infection. The lowest concentration of SARS-CoV-2 viral copies this assay can detect is 138 copies/mL. A negative result does not preclude SARS-Cov-2 infection and should not be used  as the sole basis for treatment or other patient management decisions. A negative result may occur with  improper specimen collection/handling, submission of specimen other than nasopharyngeal swab, presence of viral mutation(s) within the areas targeted by this assay, and inadequate number of viral copies(<138 copies/mL). A negative result must be combined with clinical observations, patient history, and epidemiological information. The expected result is Negative.  Fact Sheet for Patients:  EntrepreneurPulse.com.au  Fact Sheet for Healthcare Providers:  IncredibleEmployment.be  This test is no t yet approved or cleared by the [place] FDA and  has been authorized for detection and/or diagnosis of SARS-CoV-2 by FDA under an Emergency Use Authorization (EUA). This EUA will remain  in effect (meaning this test can be used) for the duration of the  COVID-19 declaration under Section 564(b)(1) of the Act, 21 U.S.C.section 360bbb-3(b)(1), unless the authorization is terminated  or revoked sooner.       Influenza A by PCR NEGATIVE NEGATIVE Final   Influenza B by PCR NEGATIVE NEGATIVE Final    Comment: (NOTE) The Xpert Xpress SARS-CoV-2/FLU/RSV plus assay is intended as an aid in the diagnosis of influenza from Nasopharyngeal swab specimens and should not be used as a sole basis for treatment. Nasal washings and aspirates are unacceptable for Xpert Xpress SARS-CoV-2/FLU/RSV testing.  Fact Sheet for Patients: EntrepreneurPulse.com.au  Fact Sheet for Healthcare Providers: IncredibleEmployment.be  This test is not yet approved or cleared by the [place] FDA and has been authorized for detection and/or diagnosis of SARS-CoV-2 by FDA under an Emergency Use Authorization (EUA). This EUA will remain in effect (meaning this test can be used) for the duration of the COVID-19 declaration under Section 564(b)(1)  of the Act, 21 U.S.C. section 360bbb-3(b)(1), unless the authorization is terminated or revoked.  Performed at [facility], [address] [place], [place] [address]   Culture, blood (routine x 2)     Status: None (Preliminary result)   Collection Time: 10/29/21  4:05 PM   Specimen: BLOOD  Result Value Ref Range Status   Specimen Description BLOOD LEFT FOREARM  Final   Special Requests   Final    BOTTLES DRAWN AEROBIC AND ANAEROBIC Blood Culture adequate volume   Culture   Final    NO GROWTH 4 DAYS Performed at [facility], [address] [place], [place] [address]    Report Status PENDING  Incomplete  Culture, blood (routine x 2)     Status: None (Preliminary result)   Collection Time: 10/30/21 12:58 AM   Specimen: BLOOD  Result Value Ref Range Status   Specimen Description BLOOD RIGHT ANTECUBITAL  Final   Special Requests   Final    BOTTLES DRAWN AEROBIC AND ANAEROBIC Blood Culture results may not be optimal due to an excessive volume of blood received in culture bottles   Culture   Final    NO GROWTH 3 DAYS Performed at [facility], [address], [place], [place] [address]    Report Status PENDING  Incomplete  Urine Culture     Status: None   Collection Time: 10/30/21  2:49 AM   Specimen: Urine, Clean Catch  Result Value Ref Range Status   Specimen Description   Final    URINE, CLEAN CATCH Performed at [facility], [address] [place], [place] [address]    Special Requests   Final    NONE Performed at [facility], [address], [place], [place] [address]    Culture   Final    NO GROWTH Performed at [facility], [place] [address] [place], [place] [address]    Report Status 10/31/2021 FINAL  Final     Labs: BNP (last 3 results) Recent Labs    10/29/21 1605  BNP 28.7   Basic Metabolic Panel: Recent Labs  Lab 10/29/21 1528 10/30/21 0452 10/31/21 0500  NA 138  --  141  K 3.4*  --  3.4*   CL 103  --  104  CO2 26  --  26  GLUCOSE 118*  --  85  BUN 19  --  13  CREATININE 1.08* 1.05* 0.87  CALCIUM 9.7  --  8.7*   Liver Function Tests: Recent Labs  Lab 10/29/21 1528  AST 40  ALT 31  ALKPHOS 98  BILITOT 0.9  PROT 8.9*  ALBUMIN 4.4   Recent Labs  Lab 10/29/21 1528  LIPASE 27   No results for input(s): "AMMONIA" in the last 168 hours. CBC: Recent Labs  Lab 10/29/21 1528 10/30/21 0452 10/31/21 0500  WBC 13.3* 16.7* 14.5*  NEUTROABS 11.4*  --   --   HGB 13.1 10.6* 10.3*  HCT 39.5 32.0* 31.3*  MCV 85.9 87.0 87.7  PLT 269 249 257   Cardiac Enzymes: No results for input(s): "CKTOTAL", "CKMB", "CKMBINDEX", "TROPONINI" in the last 168 hours. BNP: Invalid input(s): "POCBNP" CBG: No results for input(s): "GLUCAP" in the last 168 hours. D-Dimer No results for input(s): "DDIMER" in the last 72 hours. Hgb A1c No results for input(s): "HGBA1C" in the last 72 hours. Lipid Profile No results for input(s): "CHOL", "HDL", "LDLCALC", "TRIG", "CHOLHDL", "LDLDIRECT" in the last 72 hours. Thyroid function studies No results for input(s): "TSH", "T4TOTAL", "T3FREE", "THYROIDAB" in the last 72 hours.  Invalid input(s): "FREET3" Anemia work up No results for input(s): "VITAMINB12", "FOLATE", "FERRITIN", "TIBC", "IRON", "RETICCTPCT" in the last 72 hours. Urinalysis    Component Value Date/Time   COLORURINE YELLOW (A) 10/30/2021 0249   APPEARANCEUR HAZY (A) 10/30/2021 0249   LABSPEC 1.017 10/30/2021 0249   PHURINE 5.0 10/30/2021 0249   GLUCOSEU NEGATIVE 10/30/2021 0249   HGBUR NEGATIVE 10/30/2021 0249   BILIRUBINUR NEGATIVE 10/30/2021 0249   BILIRUBINUR negative 11/30/2019 0915   KETONESUR NEGATIVE 10/30/2021 0249   PROTEINUR 30 (A) 10/30/2021 0249   UROBILINOGEN 0.2 11/30/2019 0915   NITRITE NEGATIVE 10/30/2021 0249   LEUKOCYTESUR TRACE (A) 10/30/2021 0249   Sepsis Labs Recent Labs  Lab 10/29/21 1528 10/30/21 0452 10/31/21 0500  WBC 13.3* 16.7* 14.5*    Microbiology Recent Results (from the past 240 hour(s))  Group A Strep by PCR     Status: None   Collection Time: 10/28/21 11:59 AM   Specimen: Throat; Sterile Swab  Result Value Ref Range Status   Group A Strep by PCR NOT DETECTED NOT DETECTED Final    Comment: Performed at [facility], [address], [place], [place] [address]  Resp Panel by RT-PCR (Flu A&B, Covid) Anterior Nasal Swab     Status: None   Collection Time: 10/29/21  4:02 PM   Specimen: Anterior Nasal Swab  Result Value Ref Range Status   SARS Coronavirus 2 by RT PCR NEGATIVE NEGATIVE Final    Comment: (NOTE) SARS-CoV-2 target nucleic acids are NOT DETECTED.  The SARS-CoV-2 RNA is generally detectable in upper respiratory specimens during the acute phase of infection. The lowest concentration of SARS-CoV-2 viral copies this assay can detect is 138 copies/mL. A negative result does not preclude SARS-Cov-2 infection and should not be used as the sole basis for treatment or other patient management decisions. A negative result may occur with  improper specimen collection/handling, submission of specimen other than nasopharyngeal swab, presence of viral mutation(s) within the areas targeted by this assay, and inadequate number of viral copies(<138 copies/mL). A negative result must be combined with clinical observations, patient history, and epidemiological information. The expected result is Negative.  Fact Sheet for Patients:  EntrepreneurPulse.com.au  Fact Sheet for Healthcare Providers:  IncredibleEmployment.be  This test is no t yet approved or cleared by the [place] FDA and  has been authorized for detection and/or diagnosis of SARS-CoV-2 by FDA under an Emergency Use Authorization (EUA). This EUA will remain  in effect (meaning this test can  be used) for the duration of the COVID-19 declaration under Section 564(b)(1) of the Act, 21 U.S.C.section  360bbb-3(b)(1), unless the authorization is terminated  or revoked sooner.       Influenza A by PCR NEGATIVE NEGATIVE Final   Influenza B by PCR NEGATIVE NEGATIVE Final    Comment: (NOTE) The Xpert Xpress SARS-CoV-2/FLU/RSV plus assay is intended as an aid in the diagnosis of influenza from Nasopharyngeal swab specimens and should not be used as a sole basis for treatment. Nasal washings and aspirates are unacceptable for Xpert Xpress SARS-CoV-2/FLU/RSV testing.  Fact Sheet for Patients: EntrepreneurPulse.com.au  Fact Sheet for Healthcare Providers: IncredibleEmployment.be  This test is not yet approved or cleared by the [place] FDA and has been authorized for detection and/or diagnosis of SARS-CoV-2 by FDA under an Emergency Use Authorization (EUA). This EUA will remain in effect (meaning this test can be used) for the duration of the COVID-19 declaration under Section 564(b)(1) of the Act, 21 U.S.C. section 360bbb-3(b)(1), unless the authorization is terminated or revoked.  Performed at [facility], [address] [place], [place] [address]   Culture, blood (routine x 2)     Status: None (Preliminary result)   Collection Time: 10/29/21  4:05 PM   Specimen: BLOOD  Result Value Ref Range Status   Specimen Description BLOOD LEFT FOREARM  Final   Special Requests   Final    BOTTLES DRAWN AEROBIC AND ANAEROBIC Blood Culture adequate volume   Culture   Final    NO GROWTH 4 DAYS Performed at [facility], [address] [place], [place] [address]    Report Status PENDING  Incomplete  Culture, blood (routine x 2)     Status: None (Preliminary result)   Collection Time: 10/30/21 12:58 AM   Specimen: BLOOD  Result Value Ref Range Status   Specimen Description BLOOD RIGHT ANTECUBITAL  Final   Special Requests   Final    BOTTLES DRAWN AEROBIC AND ANAEROBIC Blood Culture results may not be optimal due to an  excessive volume of blood received in culture bottles   Culture   Final    NO GROWTH 3 DAYS Performed at [facility], [address], [place], [place] [address]    Report Status PENDING  Incomplete  Urine Culture     Status: None   Collection Time: 10/30/21  2:49 AM   Specimen: Urine, Clean Catch  Result Value Ref Range Status   Specimen Description   Final    URINE, CLEAN CATCH Performed at [facility], [address] [place], [place] [address]    Special Requests   Final    NONE Performed at [facility], [address] [place], [place] [address]    Culture   Final    NO GROWTH Performed at [facility], [place] [address] [place], [place] [address]    Report Status 10/31/2021 FINAL  Final     Time coordinating discharge: Over 30 minutes  SIGNED:   Nolberto Hanlon, MD  [facility] 11/02/2021, 4:46 PM Pager   If 7PM-7AM, please contact night-coverage [email or web address] Password [email or web address]

## 2021-10-31 NOTE — TOC Initial Note (Signed)
Transition of Care [facility]) - Initial/Assessment Note    Patient Details  Name: Emily Boyer MRN: [id number] Date of Birth: 1945/11/22  Transition of Care [facility]) CM/SW Contact:    Conception Oms, RN Phone Number: 10/31/2021, 11:43 AM  Clinical Narrative:                   Transition of Care [facility]) Screening Note   Patient Details  Name: Emily Boyer Date of Birth: 1945/05/27   Transition of Care [facility]) CM/SW Contact:    Conception Oms, RN Phone Number: 10/31/2021, 11:43 AM    Transition of Care Department [facility]) has reviewed patient and no TOC needs have been identified at this time. We will continue to monitor patient advancement through interdisciplinary progression rounds. If new patient transition needs arise, please place a TOC consult.   Expected Discharge Plan: Home/Self Care Barriers to Discharge: Barriers Resolved   Patient Goals and CMS Choice        Expected Discharge Plan and Services Expected Discharge Plan: Home/Self Care       Living arrangements for the past 2 months: Single Family Home Expected Discharge Date: 10/31/21                                    Prior Living Arrangements/Services Living arrangements for the past 2 months: Single Family Home Lives with:: Self                   Activities of Daily Living Home Assistive Devices/Equipment: Eyeglasses ADL Screening (condition at time of admission) Patient's cognitive ability adequate to safely complete daily activities?: Yes Is the patient deaf or have difficulty hearing?: No Does the patient have difficulty seeing, even when wearing glasses/contacts?: No Does the patient have difficulty concentrating, remembering, or making decisions?: No Patient able to express need for assistance with ADLs?: Yes Does the patient have difficulty dressing or bathing?: No Independently performs ADLs?: Yes (appropriate for developmental age) Does the patient have difficulty  walking or climbing stairs?: No Weakness of Legs: None Weakness of Arms/Hands: None  Permission Sought/Granted                  Emotional Assessment              Admission diagnosis:  Dizziness [R42] Nausea [R11.0] AKI (acute kidney injury) ([place]) [N17.9] Sepsis ([place]) [A41.9] Community acquired pneumonia, unspecified laterality [J18.9] Sepsis, due to unspecified organism, unspecified whether acute organ dysfunction present [facility]) [A41.9] Patient Active Problem List   Diagnosis Date Noted   Sepsis ([place]) 10/30/2021   CAP (community acquired pneumonia) 10/30/2021   H/O total hysterectomy 03/03/2020   DDD (degenerative disc disease), cervical 05/28/2018   Sebaceous cyst 12/20/2015   Abnormal findings-gastrointestinal tract    Encounter for general adult medical examination without abnormal findings 07/04/2014   Nerve root pain 07/04/2014   Gastroesophageal reflux disease 07/04/2014   HLD (hyperlipidemia) 07/04/2014   Hypertension 07/04/2014   PCP:  Juline Patch, MD Pharmacy:   [facility] [address] [place] [address] Phone: [phone] Fax: [phone]     Social Determinants of Health (SDOH) Interventions    Readmission Risk Interventions     No data to display

## 2021-11-01 ENCOUNTER — Telehealth: Payer: Self-pay | Admitting: *Deleted

## 2021-11-01 NOTE — Patient Outreach (Signed)
  Care Coordination TOC Note Transition Care Management Follow-up Telephone Call Date of discharge and from where: [facility] [phone] How have you been since you were released from the hospital? I had a coughing episode like in the hospital. I am feeling weak Any questions or concerns? Yes How long will I feel this weak RN discussed increasing activity, hydration RN discussed coughing and deep breathing and using her incentive spirometry RN discussed monitoring blood pressure as Dr ordered Items Reviewed: Did the pt receive and understand the discharge instructions provided? Yes  Medications obtained and verified? Yes  Other? Yes  RN discussed the importance of taking all her of antibiotics Any new allergies since your discharge? No  Dietary orders reviewed? No Do you have support at home? Yes   Home Care and Equipment/Supplies: Were home health services ordered? not applicable If so, what is the name of the agency? N/a  Has the agency set up a time to come to the patient's home? not applicable Were any new equipment or medical supplies ordered?  No What is the name of the medical supply agency? N/a Were you able to get the supplies/equipment? not applicable Do you have any questions related to the use of the equipment or supplies? No  Functional Questionnaire: (I = Independent and D = Dependent) ADLs: I  Bathing/Dressing- I  Meal Prep- I  Eating- I  Maintaining continence- I  Transferring/Ambulation- I  Managing Meds- I  Follow up appointments reviewed:  PCP Hospital f/u appt confirmed? Yes  Scheduled to see Dr Otilio Miu [phone] 2:40 . [place] [facility] f/u appt confirmed? No   Are transportation arrangements needed? No  If their condition worsens, is the pt aware to call PCP or go to the Emergency Dept.? Yes Was the patient provided with contact information for the PCP's office or ED? Yes Was to pt encouraged to call back with questions or concerns? Yes  SDOH  assessments and interventions completed:   Yes  Care Coordination Interventions Activated:  Yes   Care Coordination Interventions:  No Care Coordination interventions needed at this time.   Encounter Outcome:  Pt. Visit Completed    [place] [facility] [phone]

## 2021-11-03 LAB — CULTURE, BLOOD (ROUTINE X 2)
Culture: NO GROWTH
Special Requests: ADEQUATE

## 2021-11-04 LAB — CULTURE, BLOOD (ROUTINE X 2): Culture: NO GROWTH

## 2021-11-05 ENCOUNTER — Telehealth: Payer: Self-pay | Admitting: *Deleted

## 2021-11-05 NOTE — Patient Outreach (Signed)
  Care Coordination   Follow Up Visit Note   11/05/2021 Name: Emily Boyer MRN: [id number] DOB: 01-25-1945  Emily Boyer is a 76 y.o. year old female who sees Juline Patch, MD for primary care. I spoke with  [name] by phone today.  What matters to the patients health and wellness today?  1. Can I wash my hair?  RN discussed with patient that yes she can wash hair. Make sure dries well. Stay inside or wrap up good if going out in the air. 2. My stomach is sore. Patient has been coughing a lot and doing deep breathing exercises. Instructed patient how to hold abdomen with a pillow when she coughs.  3. Patient is giving blood pressure readings that she had documented. RN instructed patient to take her blood pressure readings to follow up Dr appointment tomorrow.     Goals Addressed   None    No follow up needed  Encounter Outcome:  Pt. Visit Completed   [place] [facility] [phone]

## 2021-11-06 ENCOUNTER — Other Ambulatory Visit
Admission: RE | Admit: 2021-11-06 | Discharge: 2021-11-06 | Disposition: A | Discharge status: Home / Self Care | Payer: Medicare HMO | Source: Home / Self Care | Attending: Family Medicine | Admitting: Family Medicine

## 2021-11-06 ENCOUNTER — Ambulatory Visit
Admission: RE | Admit: 2021-11-06 | Discharge: 2021-11-06 | Disposition: A | Discharge status: Home / Self Care | Payer: Medicare HMO | Source: Ambulatory Visit | Attending: Family Medicine | Admitting: Family Medicine

## 2021-11-06 ENCOUNTER — Ambulatory Visit
Admission: RE | Admit: 2021-11-06 | Discharge: 2021-11-06 | Disposition: A | Discharge status: Home / Self Care | Payer: Medicare HMO | Attending: Family Medicine | Admitting: Family Medicine

## 2021-11-06 ENCOUNTER — Encounter: Payer: Self-pay | Admitting: Family Medicine

## 2021-11-06 ENCOUNTER — Ambulatory Visit (INDEPENDENT_AMBULATORY_CARE_PROVIDER_SITE_OTHER): Payer: Medicare HMO | Admitting: Family Medicine

## 2021-11-06 ENCOUNTER — Other Ambulatory Visit: Payer: Self-pay

## 2021-11-06 VITALS — BP 122/62 | HR 60 | Temp 98.4°F | Ht 61.0 in | Wt 149.0 lb

## 2021-11-06 DIAGNOSIS — R059 Cough, unspecified: Secondary | ICD-10-CM | POA: Diagnosis not present

## 2021-11-06 DIAGNOSIS — J189 Pneumonia, unspecified organism: Secondary | ICD-10-CM | POA: Insufficient documentation

## 2021-11-06 DIAGNOSIS — E876 Hypokalemia: Secondary | ICD-10-CM

## 2021-11-06 LAB — CBC WITH DIFFERENTIAL/PLATELET
Abs Immature Granulocytes: 0.1 10*3/uL — ABNORMAL HIGH (ref 0.00–0.07)
Basophils Absolute: 0 10*3/uL (ref 0.0–0.1)
Basophils Relative: 0 %
Eosinophils Absolute: 0.1 10*3/uL (ref 0.0–0.5)
Eosinophils Relative: 1 %
HCT: 35.8 % — ABNORMAL LOW (ref 36.0–46.0)
Hemoglobin: 11.9 g/dL — ABNORMAL LOW (ref 12.0–15.0)
Immature Granulocytes: 1 %
Lymphocytes Relative: 33 %
Lymphs Abs: 3.4 10*3/uL (ref 0.7–4.0)
MCH: 29.1 pg (ref 26.0–34.0)
MCHC: 33.2 g/dL (ref 30.0–36.0)
MCV: 87.5 fL (ref 80.0–100.0)
Monocytes Absolute: 0.9 10*3/uL (ref 0.1–1.0)
Monocytes Relative: 9 %
Neutro Abs: 5.7 10*3/uL (ref 1.7–7.7)
Neutrophils Relative %: 56 %
Platelets: 514 10*3/uL — ABNORMAL HIGH (ref 150–400)
RBC: 4.09 MIL/uL (ref 3.87–5.11)
RDW: 14.1 % (ref 11.5–15.5)
WBC: 10.3 10*3/uL (ref 4.0–10.5)
nRBC: 0 % (ref 0.0–0.2)

## 2021-11-06 LAB — RENAL FUNCTION PANEL
Albumin: 3.8 g/dL (ref 3.5–5.0)
Anion gap: 9 (ref 5–15)
BUN: 18 mg/dL (ref 8–23)
CO2: 27 mmol/L (ref 22–32)
Calcium: 9.3 mg/dL (ref 8.9–10.3)
Chloride: 102 mmol/L (ref 98–111)
Creatinine, Ser: 0.98 mg/dL (ref 0.44–1.00)
GFR, Estimated: >60 mL/min (ref >60)
Glucose, Bld: 92 mg/dL (ref 70–99)
Phosphorus: 4.1 mg/dL (ref 2.5–4.6)
Potassium: 4 mmol/L (ref 3.5–5.1)
Sodium: 138 mmol/L (ref 135–145)

## 2021-11-06 MED ORDER — BENZONATATE 200 MG PO CAPS: 200.0000 mg | ORAL_CAPSULE | Freq: Three times a day (TID) | ORAL | 0 refills | Status: AC | PRN

## 2021-11-06 NOTE — Progress Notes (Signed)
Date:  11/06/2021   Name:  Emily Boyer   DOB:  1945-06-26   MRN:  [id number]   Chief Complaint: Hospitalization Follow-up (Admitted on 10/10 and discharged on 10/31/21- sepsis and community acquired pneumonia. TOC on 11/01/21)   Follow up Hospitalization  Patient was admitted to [facility] on 10/10 and discharged on 10/11. She was treated for pneumonia. Treatment for this included .antibiotics Telephone follow up was done on 10/12 She reports good compliance with treatment. She reports this condition is minimal improvement.Marland Kitchen  ----------------------------------------------------------------------------------------- -       Lab Results  Component Value Date   NA 141 10/31/2021   K 3.4 (L) 10/31/2021   CO2 26 10/31/2021   GLUCOSE 85 10/31/2021   BUN 13 10/31/2021   CREATININE 0.87 10/31/2021   CALCIUM 8.7 (L) 10/31/2021   EGFR 69 01/31/2021   GFRNONAA >60 10/31/2021   Lab Results  Component Value Date   CHOL 239 (H) 08/01/2021   HDL 40 08/01/2021   LDLCALC 158 (H) 08/01/2021   TRIG 221 (H) 08/01/2021   CHOLHDL 5.3 (H) 12/04/2016   Lab Results  Component Value Date   TSH 1.240 08/20/2019   No results found for: "HGBA1C" Lab Results  Component Value Date   WBC 14.5 (H) 10/31/2021   HGB 10.3 (L) 10/31/2021   HCT 31.3 (L) 10/31/2021   MCV 87.7 10/31/2021   PLT 257 10/31/2021   Lab Results  Component Value Date   ALT 31 10/29/2021   AST 40 10/29/2021   ALKPHOS 98 10/29/2021   BILITOT 0.9 10/29/2021   No results found for: "25OHVITD2", "25OHVITD3", "VD25OH"   Review of Systems  Constitutional:  Negative for chills and fever.  HENT:  Negative for drooling, ear discharge, ear pain, postnasal drip, sinus pressure and sore throat.   Respiratory:  Positive for cough and shortness of breath. Negative for wheezing.   Cardiovascular:  Negative for chest pain, palpitations and leg swelling.  Gastrointestinal:  Negative for abdominal pain, blood in  stool, constipation, diarrhea and nausea.  Endocrine: Negative for polydipsia.  Genitourinary:  Negative for dysuria, frequency, hematuria and urgency.  Musculoskeletal:  Negative for back pain, myalgias and neck pain.  Skin:  Negative for rash.  Allergic/Immunologic: Negative for environmental allergies.  Neurological:  Negative for dizziness and headaches.  Hematological:  Does not bruise/bleed easily.  Psychiatric/Behavioral:  Negative for suicidal ideas. The patient is not nervous/anxious.     Patient Active Problem List   Diagnosis Date Noted   Sepsis due to pneumonia ([place]) 10/30/2021   CAP (community acquired pneumonia) 10/30/2021   H/O total hysterectomy 03/03/2020   DDD (degenerative disc disease), cervical 05/28/2018   Sebaceous cyst 12/20/2015   Abnormal findings-gastrointestinal tract    Encounter for general adult medical examination without abnormal findings 07/04/2014   Nerve root pain 07/04/2014   Gastroesophageal reflux disease 07/04/2014   HLD (hyperlipidemia) 07/04/2014   Hypertension 07/04/2014    Allergies  Allergen Reactions   Guaifenesin & Derivatives    Methocarbamol Itching   Penicillins Itching   Tramadol Hcl Itching    Past Surgical History:  Procedure Laterality Date   BREAST BIOPSY Right 2004   neg   CATARACT EXTRACTION W/PHACO Right 04/29/2017   Procedure: CATARACT EXTRACTION PHACO AND INTRAOCULAR LENS PLACEMENT ([place]) RIGHT;  Surgeon: Eulogio Bear, MD;  Location: [place];  Service: Ophthalmology;  Laterality: Right;   CATARACT EXTRACTION W/PHACO Left 07/15/2017   Procedure: CATARACT EXTRACTION PHACO AND INTRAOCULAR LENS  PLACEMENT ([place]) LEFT;  Surgeon: Eulogio Bear, MD;  Location: [place];  Service: Ophthalmology;  Laterality: Left;   COLONOSCOPY WITH PROPOFOL N/A 02/06/2015   Procedure: COLONOSCOPY WITH PROPOFOL;  Surgeon: [name], MD;  Location: [place];  Service: Endoscopy;  Laterality: N/A;    EYE SURGERY  2019   TUBAL LIGATION  1980s   VAGINAL HYSTERECTOMY  1990s   one ovary removed as well    Social History   Tobacco Use   Smoking status: Former    Packs/day: 0.50    Years: 50.00    Total pack years: 25.00    Types: Cigarettes, E-cigarettes    Quit date: 11/22/2011    Years since quitting: 9.9   Smokeless tobacco: Never   Tobacco comments:    quit 2014  Vaping Use   Vaping Use: Never used  Substance Use Topics   Alcohol use: Yes    Alcohol/week: 2.0 standard drinks of alcohol    Types: 2 Glasses of wine per week   Drug use: No     Medication list has been reviewed and updated.  Current Meds  Medication Sig   acetaminophen (TYLENOL) 500 MG tablet Take 500 mg by mouth every 6 (six) hours as needed.   aspirin EC 81 MG tablet Take by mouth.   atorvastatin (LIPITOR) 10 MG tablet Take 10 mg by mouth daily.   benzonatate (TESSALON) 200 MG capsule Take 1 capsule (200 mg total) by mouth 3 (three) times daily as needed for up to 7 days for cough.   cholecalciferol (VITAMIN D) 1000 units tablet Take 1,000 Units by mouth daily.   losartan-hydrochlorothiazide (HYZAAR) 50-12.5 MG tablet Take 1 tablet by mouth daily.   Multiple Vitamins-Minerals (CENTRUM SILVER 50+WOMEN) TABS Take by mouth.   Omega 3 1000 MG CAPS Take 1 capsule by mouth daily.   pantoprazole (PROTONIX) 40 MG tablet Take 1 tablet (40 mg total) by mouth daily.   Zinc Oxide-Vitamin C (ZINC PLUS VITAMIN C PO) Take 1 tablet by mouth daily.       11/06/2021    3:02 PM 08/01/2021    8:19 AM 01/31/2021    8:24 AM 10/11/2020    8:00 AM  GAD 7 : Generalized Anxiety Score  Nervous, Anxious, on Edge 0 0 0 0  Control/stop worrying 0 0 0 0  Worry too much - different things 0 0 0 0  Trouble relaxing 0 0 0 0  Restless 0 0 0 0  Easily annoyed or irritable 0 0 0 0  Afraid - awful might happen 0 0 0 0  Total GAD 7 Score 0 0 0 0  Anxiety Difficulty Not difficult at all Not difficult at all Not difficult at all         11/06/2021    3:01 PM 08/01/2021    8:19 AM 02/12/2021    4:01 PM  Depression screen PHQ 2/9  Decreased Interest 0 0 0  Down, Depressed, Hopeless 0 0 0  PHQ - 2 Score 0 0 0  Altered sleeping 0 3   Tired, decreased energy 3 2   Change in appetite 0 0   Feeling bad or failure about yourself  0 0   Trouble concentrating 0 0   Moving slowly or fidgety/restless 0 0   Suicidal thoughts 0 0   PHQ-9 Score 3 5   Difficult doing work/chores Somewhat difficult Not difficult at all     BP Readings from Last 3 Encounters:  11/06/21 122/62  10/31/21 (!) 160/85  10/28/21 (!) 142/84    Physical Exam Vitals and nursing note reviewed.  Constitutional:      Appearance: She is well-developed.  HENT:     Head: Normocephalic.     Right Ear: External ear normal.     Left Ear: External ear normal.     Mouth/Throat:     Mouth: Mucous membranes are moist.  Eyes:     General: Lids are everted, no foreign bodies appreciated. No scleral icterus.       Left eye: No foreign body or hordeolum.     Conjunctiva/sclera: Conjunctivae normal.     Right eye: Right conjunctiva is not injected.     Left eye: Left conjunctiva is not injected.     Pupils: Pupils are equal, round, and reactive to light.  Neck:     Thyroid: No thyromegaly.     Vascular: No JVD.     Trachea: No tracheal deviation.  Cardiovascular:     Rate and Rhythm: Normal rate and regular rhythm.     Heart sounds: Normal heart sounds. No murmur heard.    No friction rub. No gallop.  Pulmonary:     Effort: Pulmonary effort is normal. No respiratory distress.     Breath sounds: Normal breath sounds. No wheezing, rhonchi or rales.  Abdominal:     General: Bowel sounds are normal.     Palpations: Abdomen is soft. There is no mass.     Tenderness: There is no abdominal tenderness. There is no guarding or rebound.  Musculoskeletal:        General: No tenderness. Normal range of motion.     Cervical back: Normal range of motion and  neck supple.  Lymphadenopathy:     Cervical: No cervical adenopathy.  Skin:    General: Skin is warm.     Findings: No rash.  Neurological:     Mental Status: She is alert and oriented to person, place, and time.     Cranial Nerves: No cranial nerve deficit.     Deep Tendon Reflexes: Reflexes normal.  Psychiatric:        Mood and Affect: Mood is not anxious or depressed.     Wt Readings from Last 3 Encounters:  11/06/21 149 lb (67.6 kg)  10/29/21 147 lb 11.3 oz (67 kg)  10/28/21 148 lb (67.1 kg)    BP 122/62   Pulse 60   Temp 98.4 F (36.9 C) (Oral)   Ht '5\' 1"'  (1.549 m)   Wt 149 lb (67.6 kg)   BMI 28.15 kg/m  [facility] [place] [facility] Discharge Acute Issues Care Follow Up                                                                        Patient Demographics  Estelle Greenleaf, is a 76 y.o. female  DOB 12-23-1945  MRN [id number].  Primary  MD  Juline Patch, MD   Reason for TCC follow Up -follow-up today is to review patient's pulmonary status recheck CBC rechecked chest x-ray and recheck renal function panel for hyper pulm kalemia.   Past Medical History:  Diagnosis Date   Allergy Sinus   Bone spur    right side of neck   Colitis    DDD (degenerative disc disease), cervical    Diverticulitis    GERD (gastroesophageal reflux disease)    Heart murmur    followed by PCP   HLD (hyperlipidemia)    Hypertension    Seasonal allergies     Past Surgical History:  Procedure Laterality Date   BREAST BIOPSY Right 2004   neg   CATARACT EXTRACTION W/PHACO Right 04/29/2017   Procedure: CATARACT EXTRACTION PHACO AND INTRAOCULAR LENS PLACEMENT ([place]) RIGHT;  Surgeon: Eulogio Bear, MD;  Location: [place];  Service: Ophthalmology;  Laterality: Right;   CATARACT EXTRACTION W/PHACO Left 07/15/2017    Procedure: CATARACT EXTRACTION PHACO AND INTRAOCULAR LENS PLACEMENT ([place]) LEFT;  Surgeon: Eulogio Bear, MD;  Location: [place];  Service: Ophthalmology;  Laterality: Left;   COLONOSCOPY WITH PROPOFOL N/A 02/06/2015   Procedure: COLONOSCOPY WITH PROPOFOL;  Surgeon: [name], MD;  Location: [place];  Service: Endoscopy;  Laterality: N/A;   EYE SURGERY  2019   TUBAL LIGATION  1980s   VAGINAL HYSTERECTOMY  1990s   one ovary removed as well       Recent HPI and Hospital Course  Patient is follow-up for pneumonia and is doing well except for persistence of cough and shortness of breath.  Minimal improvement but there is improvement.  [facility] Acute Care Issue to be followed in the [place] care in the clinic as noted is follow-up chest x-ray follow-up examination and pulse ox follow-up laboratory CBC/renal function panel.   Subjective:   Emily Boyer today has, No headache, No chest pain, No abdominal pain - No Nausea, No new weakness tingling or numbness, No Cough - SOB.  Patient does have shortness of breath with activity.  Assessment & Plan    There are no diagnoses linked to this encounter.   Reason for frequent admissions/ER visits       Objective:   Vitals:   11/06/21 1449  BP: 122/62  Pulse: 60  Temp: 98.4 F (36.9 C)  TempSrc: Oral  Weight: 149 lb (67.6 kg)  Height: '5\' 1"'  (1.549 m)    Wt Readings from Last 3 Encounters:  11/06/21 149 lb (67.6 kg)  10/29/21 147 lb 11.3 oz (67 kg)  10/28/21 148 lb (67.1 kg)    Allergies as of 11/06/2021       Reactions   Guaifenesin & Derivatives    Methocarbamol Itching   Penicillins Itching   Tramadol Hcl Itching        Medication List        Accurate as of November 06, 2021  3:25 PM. If you have any questions, ask your nurse or doctor.          acetaminophen 500 MG tablet Commonly known as: TYLENOL Take 500 mg by mouth every 6 (six) hours as needed.   aspirin  EC 81 MG tablet Take by mouth.   atorvastatin 10 MG tablet Commonly known as: LIPITOR Take 10 mg by mouth daily.   benzonatate 200 MG capsule Commonly known as: TESSALON Take 1 capsule (200 mg total) by mouth 3 (three) times daily as  needed for up to 7 days for cough.   Centrum Silver 50+Women Tabs Take by mouth.   cholecalciferol 1000 units tablet Commonly known as: VITAMIN D Take 1,000 Units by mouth daily.   losartan-hydrochlorothiazide 50-12.5 MG tablet Commonly known as: HYZAAR Take 1 tablet by mouth daily.   Omega 3 1000 MG Caps Take 1 capsule by mouth daily.   pantoprazole 40 MG tablet Commonly known as: PROTONIX Take 1 tablet (40 mg total) by mouth daily.   ZINC PLUS VITAMIN C PO Take 1 tablet by mouth daily.         Physical Exam: Constitutional: Patient appears well-developed and well-nourished. Not in obvious distress. HENT: Normocephalic, atraumatic, External right and left ear normal. Oropharynx is clear and moist.  Eyes: Conjunctivae and EOM are normal. PERRLA, no scleral icterus. Neck: Normal ROM. Neck supple. No JVD. No tracheal deviation. No thyromegaly. CVS: RRR, S1/S2 +, no murmurs, no gallops, no carotid bruit.  Pulmonary: Effort and breath sounds normal, no stridor, rhonchi, wheezes, rales.  Abdominal: Soft. BS +, no distension, tenderness, rebound or guarding.  Musculoskeletal: Normal range of motion. No edema and no tenderness.  Lymphadenopathy: No lymphadenopathy noted, cervical, inguinal or axillary Neuro: Alert. Normal reflexes, muscle tone coordination. No cranial nerve deficit. Skin: Skin is warm and dry. No rash noted. Not diaphoretic. No erythema. No pallor. Psychiatric: Normal mood and affect. Behavior, judgment, thought content normal.   Data Review   Micro Results Recent Results (from the past 240 hour(s))  Group A Strep by PCR     Status: None   Collection Time: 10/28/21 11:59 AM   Specimen: Throat; Sterile Swab  Result  Value Ref Range Status   Group A Strep by PCR NOT DETECTED NOT DETECTED Final    Comment: Performed at [facility], [address], [place], [place] [address]  Resp Panel by RT-PCR (Flu A&B, Covid) Anterior Nasal Swab     Status: None   Collection Time: 10/29/21  4:02 PM   Specimen: Anterior Nasal Swab  Result Value Ref Range Status   SARS Coronavirus 2 by RT PCR NEGATIVE NEGATIVE Final    Comment: (NOTE) SARS-CoV-2 target nucleic acids are NOT DETECTED.  The SARS-CoV-2 RNA is generally detectable in upper respiratory specimens during the acute phase of infection. The lowest concentration of SARS-CoV-2 viral copies this assay can detect is 138 copies/mL. A negative result does not preclude SARS-Cov-2 infection and should not be used as the sole basis for treatment or other patient management decisions. A negative result may occur with  improper specimen collection/handling, submission of specimen other than nasopharyngeal swab, presence of viral mutation(s) within the areas targeted by this assay, and inadequate number of viral copies(<138 copies/mL). A negative result must be combined with clinical observations, patient history, and epidemiological information. The expected result is Negative.  Fact Sheet for Patients:  EntrepreneurPulse.com.au  Fact Sheet for Healthcare Providers:  IncredibleEmployment.be  This test is no t yet approved or cleared by the [place] FDA and  has been authorized for detection and/or diagnosis of SARS-CoV-2 by FDA under an Emergency Use Authorization (EUA). This EUA will remain  in effect (meaning this test can be used) for the duration of the COVID-19 declaration under Section 564(b)(1) of the Act, 21 U.S.C.section 360bbb-3(b)(1), unless the authorization is terminated  or revoked sooner.       Influenza A by PCR NEGATIVE NEGATIVE Final   Influenza B by PCR NEGATIVE NEGATIVE Final  Comment: (NOTE) The Xpert Xpress SARS-CoV-2/FLU/RSV plus assay is intended as an aid in the diagnosis of influenza from Nasopharyngeal swab specimens and should not be used as a sole basis for treatment. Nasal washings and aspirates are unacceptable for Xpert Xpress SARS-CoV-2/FLU/RSV testing.  Fact Sheet for Patients: EntrepreneurPulse.com.au  Fact Sheet for Healthcare Providers: IncredibleEmployment.be  This test is not yet approved or cleared by the [place] FDA and has been authorized for detection and/or diagnosis of SARS-CoV-2 by FDA under an Emergency Use Authorization (EUA). This EUA will remain in effect (meaning this test can be used) for the duration of the COVID-19 declaration under Section 564(b)(1) of the Act, 21 U.S.C. section 360bbb-3(b)(1), unless the authorization is terminated or revoked.  Performed at [facility], [address] [place], [place] [address]   Culture, blood (routine x 2)     Status: None   Collection Time: 10/29/21  4:05 PM   Specimen: BLOOD  Result Value Ref Range Status   Specimen Description BLOOD LEFT FOREARM  Final   Special Requests   Final    BOTTLES DRAWN AEROBIC AND ANAEROBIC Blood Culture adequate volume   Culture   Final    NO GROWTH 5 DAYS Performed at [facility], [address] [place], [place] [address]    Report Status 11/03/2021 FINAL  Final  Culture, blood (routine x 2)     Status: None   Collection Time: 10/30/21 12:58 AM   Specimen: BLOOD  Result Value Ref Range Status   Specimen Description BLOOD RIGHT ANTECUBITAL  Final   Special Requests   Final    BOTTLES DRAWN AEROBIC AND ANAEROBIC Blood Culture results may not be optimal due to an excessive volume of blood received in culture bottles   Culture   Final    NO GROWTH 5 DAYS Performed at [facility], [address] [place], [place] [address]    Report Status 11/04/2021 FINAL  Final  Urine  Culture     Status: None   Collection Time: 10/30/21  2:49 AM   Specimen: Urine, Clean Catch  Result Value Ref Range Status   Specimen Description   Final    URINE, CLEAN CATCH Performed at [facility], [address] [place], [place] [address]    Special Requests   Final    NONE Performed at [facility], [address], [place], [place] [address]    Culture   Final    NO GROWTH Performed at [facility], [place] [address], [place],  [address]    Report Status 10/31/2021 FINAL  Final     CBC Recent Labs  Lab 10/31/21 0500  WBC 14.5*  HGB 10.3*  HCT 31.3*  PLT 257  MCV 87.7  MCH 28.9  MCHC 32.9  RDW 14.0    Chemistries  Recent Labs  Lab 10/31/21 0500  NA 141  K 3.4*  CL 104  CO2 26  GLUCOSE 85  BUN 13  CREATININE 0.87  CALCIUM 8.7*   ------------------------------------------------------------------------------------------------------------------ estimated creatinine clearance is 49.1 mL/min (by C-G formula based on SCr of 0.87 mg/dL). ------------------------------------------------------------------------------------------------------------------ No results for input(s): "HGBA1C" in the last 72 hours. ------------------------------------------------------------------------------------------------------------------ No results for input(s): "CHOL", "HDL", "LDLCALC", "TRIG", "CHOLHDL", "LDLDIRECT" in the last 72 hours. ------------------------------------------------------------------------------------------------------------------ No results for input(s): "TSH", "T4TOTAL", "T3FREE", "THYROIDAB" in the last 72 hours.  Invalid input(s): "FREET3" ------------------------------------------------------------------------------------------------------------------ No results for input(s): "VITAMINB12", "FOLATE", "FERRITIN", "TIBC", "IRON", "RETICCTPCT" in the last 72 hours.  Coagulation  profile No results for input(s): "INR",  "PROTIME" in the last 168 hours.  No results for input(s): "DDIMER" in the last 72 hours.  Cardiac Enzymes No results for input(s): "CKMB", "TROPONINI", "MYOGLOBIN" in the last 168 hours.  Invalid input(s): "CK" ------------------------------------------------------------------------------------------------------------------ Invalid input(s): "POCBNP"   Time Spent in minutes  [place] M.D on 11/06/2021 at 3:25 PM      Assessment and Plan:  1. Community acquired pneumonia, unspecified laterality Patient is follow-up from admission on 1010 and discharged on 1011 for pneumonia and suspected sepsis.  Blood cultures were negative thereafter.  We obtain chest x-ray today.  Impression has been minimal stranding opacities in the left lung base decreased from prior findings likely related to resolving infection.  Incidental finding of possible indeterminate small nodular density over the left lower new from prior.  This may be a nipple shadow we will contact patient to repeat checks x-ray with nipple markers or CT.  We will discuss this when patient returns for follow-up on Thursday. - DG Chest 2 View  2. Hypokalemia Recheck of renal function panel is normal with resolution of hypokalemia.  White count was also noted to be improved with white count of 10,300 we will recheck patient on Thursday and adjust accordingly.  Was noted that platelets are 514 and this may be some demargination.   Otilio Miu, MD

## 2021-11-08 ENCOUNTER — Ambulatory Visit (INDEPENDENT_AMBULATORY_CARE_PROVIDER_SITE_OTHER): Payer: Medicare HMO | Admitting: Family Medicine

## 2021-11-08 ENCOUNTER — Ambulatory Visit
Admission: RE | Admit: 2021-11-08 | Discharge: 2021-11-08 | Disposition: A | Discharge status: Home / Self Care | Payer: Medicare HMO | Attending: Family Medicine | Admitting: Family Medicine

## 2021-11-08 ENCOUNTER — Encounter: Payer: Self-pay | Admitting: Family Medicine

## 2021-11-08 ENCOUNTER — Ambulatory Visit
Admission: RE | Admit: 2021-11-08 | Discharge: 2021-11-08 | Disposition: A | Discharge status: Home / Self Care | Payer: Medicare HMO | Source: Ambulatory Visit | Attending: Family Medicine | Admitting: Family Medicine

## 2021-11-08 VITALS — BP 128/70 | HR 68 | Ht 61.0 in | Wt 149.0 lb

## 2021-11-08 DIAGNOSIS — R9389 Abnormal findings on diagnostic imaging of other specified body structures: Secondary | ICD-10-CM | POA: Diagnosis not present

## 2021-11-08 DIAGNOSIS — J984 Other disorders of lung: Secondary | ICD-10-CM | POA: Diagnosis not present

## 2021-11-08 DIAGNOSIS — I700 Atherosclerosis of aorta: Secondary | ICD-10-CM | POA: Diagnosis not present

## 2021-11-08 DIAGNOSIS — J189 Pneumonia, unspecified organism: Secondary | ICD-10-CM | POA: Insufficient documentation

## 2021-11-08 DIAGNOSIS — D229 Melanocytic nevi, unspecified: Secondary | ICD-10-CM | POA: Diagnosis not present

## 2021-11-08 NOTE — Progress Notes (Signed)
Date:  11/08/2021   Name:  Emily Boyer   DOB:  November 16, 1945   MRN:  [id number]   Chief Complaint: follow up pneumonia  Pneumonia She complains of cough, frequent throat clearing and shortness of breath. There is no chest tightness, hemoptysis, hoarse voice or wheezing. This is a new problem. The problem has been gradually improving. The cough is productive of sputum (this am pale yellow). Associated symptoms include dyspnea on exertion and malaise/fatigue. Pertinent negatives include no chest pain, postnasal drip or sore throat.    Lab Results  Component Value Date   NA 138 11/06/2021   K 4.0 11/06/2021   CO2 27 11/06/2021   GLUCOSE 92 11/06/2021   BUN 18 11/06/2021   CREATININE 0.98 11/06/2021   CALCIUM 9.3 11/06/2021   EGFR 69 01/31/2021   GFRNONAA >60 11/06/2021   Lab Results  Component Value Date   CHOL 239 (H) 08/01/2021   HDL 40 08/01/2021   LDLCALC 158 (H) 08/01/2021   TRIG 221 (H) 08/01/2021   CHOLHDL 5.3 (H) 12/04/2016   Lab Results  Component Value Date   TSH 1.240 08/20/2019   No results found for: "HGBA1C" Lab Results  Component Value Date   WBC 10.3 11/06/2021   HGB 11.9 (L) 11/06/2021   HCT 35.8 (L) 11/06/2021   MCV 87.5 11/06/2021   PLT 514 (H) 11/06/2021   Lab Results  Component Value Date   ALT 31 10/29/2021   AST 40 10/29/2021   ALKPHOS 98 10/29/2021   BILITOT 0.9 10/29/2021   No results found for: "25OHVITD2", "25OHVITD3", "VD25OH"   Review of Systems  Constitutional:  Positive for malaise/fatigue.  HENT:  Negative for hoarse voice, postnasal drip, sinus pain and sore throat.   Respiratory:  Positive for cough and shortness of breath. Negative for hemoptysis, choking, chest tightness, wheezing and stridor.   Cardiovascular:  Positive for dyspnea on exertion. Negative for chest pain, palpitations and leg swelling.  Gastrointestinal:  Negative for abdominal distention and abdominal pain.    Patient Active Problem List   Diagnosis  Date Noted   Sepsis due to pneumonia ([place]) 10/30/2021   CAP (community acquired pneumonia) 10/30/2021   H/O total hysterectomy 03/03/2020   DDD (degenerative disc disease), cervical 05/28/2018   Sebaceous cyst 12/20/2015   Abnormal findings-gastrointestinal tract    Encounter for general adult medical examination without abnormal findings 07/04/2014   Nerve root pain 07/04/2014   Gastroesophageal reflux disease 07/04/2014   HLD (hyperlipidemia) 07/04/2014   Hypertension 07/04/2014    Allergies  Allergen Reactions   Guaifenesin & Derivatives    Methocarbamol Itching   Penicillins Itching   Tramadol Hcl Itching    Past Surgical History:  Procedure Laterality Date   BREAST BIOPSY Right 2004   neg   CATARACT EXTRACTION W/PHACO Right 04/29/2017   Procedure: CATARACT EXTRACTION PHACO AND INTRAOCULAR LENS PLACEMENT ([place]) RIGHT;  Surgeon: Eulogio Bear, MD;  Location: [facility];  Service: Ophthalmology;  Laterality: Right;   CATARACT EXTRACTION W/PHACO Left 07/15/2017   Procedure: CATARACT EXTRACTION PHACO AND INTRAOCULAR LENS PLACEMENT ([place]) LEFT;  Surgeon: Eulogio Bear, MD;  Location: [facility];  Service: Ophthalmology;  Laterality: Left;   COLONOSCOPY WITH PROPOFOL N/A 02/06/2015   Procedure: COLONOSCOPY WITH PROPOFOL;  Surgeon: [name], MD;  Location: [facility];  Service: Endoscopy;  Laterality: N/A;   EYE SURGERY  2019   TUBAL LIGATION  1980s   VAGINAL HYSTERECTOMY  1990s   one ovary  removed as well    Social History   Tobacco Use   Smoking status: Former    Packs/day: 0.50    Years: 50.00    Total pack years: 25.00    Types: Cigarettes, E-cigarettes    Quit date: 11/22/2011    Years since quitting: 9.9   Smokeless tobacco: Never   Tobacco comments:    quit 2014  Vaping Use   Vaping Use: Never used  Substance Use Topics   Alcohol use: Yes    Alcohol/week: 2.0 standard drinks of alcohol    Types: 2 Glasses of wine per week    Drug use: No     Medication list has been reviewed and updated.  Current Meds  Medication Sig   acetaminophen (TYLENOL) 500 MG tablet Take 500 mg by mouth every 6 (six) hours as needed.   aspirin EC 81 MG tablet Take by mouth.   atorvastatin (LIPITOR) 10 MG tablet Take 10 mg by mouth daily.   benzonatate (TESSALON) 200 MG capsule Take 1 capsule (200 mg total) by mouth 3 (three) times daily as needed for up to 7 days for cough.   cholecalciferol (VITAMIN D) 1000 units tablet Take 1,000 Units by mouth daily.   losartan-hydrochlorothiazide (HYZAAR) 50-12.5 MG tablet Take 1 tablet by mouth daily.   Multiple Vitamins-Minerals (CENTRUM SILVER 50+WOMEN) TABS Take by mouth.   Omega 3 1000 MG CAPS Take 1 capsule by mouth daily.   pantoprazole (PROTONIX) 40 MG tablet Take 1 tablet (40 mg total) by mouth daily.   Zinc Oxide-Vitamin C (ZINC PLUS VITAMIN C PO) Take 1 tablet by mouth daily.       11/08/2021   11:34 AM 11/06/2021    3:02 PM 08/01/2021    8:19 AM 01/31/2021    8:24 AM  GAD 7 : Generalized Anxiety Score  Nervous, Anxious, on Edge 0 0 0 0  Control/stop worrying 0 0 0 0  Worry too much - different things 0 0 0 0  Trouble relaxing 0 0 0 0  Restless 0 0 0 0  Easily annoyed or irritable 0 0 0 0  Afraid - awful might happen 0 0 0 0  Total GAD 7 Score 0 0 0 0  Anxiety Difficulty Not difficult at all Not difficult at all Not difficult at all Not difficult at all       11/08/2021   11:34 AM 11/06/2021    3:01 PM 08/01/2021    8:19 AM  Depression screen PHQ 2/9  Decreased Interest 0 0 0  Down, Depressed, Hopeless 0 0 0  PHQ - 2 Score 0 0 0  Altered sleeping 0 0 3  Tired, decreased energy 0 3 2  Change in appetite 0 0 0  Feeling bad or failure about yourself  0 0 0  Trouble concentrating 0 0 0  Moving slowly or fidgety/restless 0 0 0  Suicidal thoughts 0 0 0  PHQ-9 Score 0 3 5  Difficult doing work/chores Not difficult at all Somewhat difficult Not difficult at all    BP  Readings from Last 3 Encounters:  11/08/21 128/70  11/06/21 122/62  10/31/21 (!) 160/85    Physical Exam Vitals and nursing note reviewed. Exam conducted with a chaperone present.  Constitutional:      General: She is not in acute distress.    Appearance: She is not diaphoretic.  HENT:     Head: Normocephalic and atraumatic.     Right Ear: External ear normal.  Left Ear: External ear normal.     Nose: Nose normal.  Eyes:     General:        Right eye: No discharge.        Left eye: No discharge.     Conjunctiva/sclera: Conjunctivae normal.     Pupils: Pupils are equal, round, and reactive to light.  Neck:     Thyroid: No thyromegaly.     Vascular: No JVD.  Cardiovascular:     Rate and Rhythm: Normal rate and regular rhythm.     Heart sounds: Normal heart sounds. No murmur heard.    No friction rub. No gallop.  Pulmonary:     Effort: Pulmonary effort is normal.     Breath sounds: Normal breath sounds.  Abdominal:     General: Bowel sounds are normal.     Palpations: Abdomen is soft. There is no mass.     Tenderness: There is no abdominal tenderness. There is no guarding.  Musculoskeletal:        General: Normal range of motion.     Cervical back: Normal range of motion and neck supple.  Lymphadenopathy:     Cervical: No cervical adenopathy.  Skin:    General: Skin is warm and dry.     Findings: Lesion present. No rash.  Neurological:     Mental Status: She is alert.     Wt Readings from Last 3 Encounters:  11/08/21 149 lb (67.6 kg)  11/06/21 149 lb (67.6 kg)  10/29/21 147 lb 11.3 oz (67 kg)    BP 128/70   Pulse 68   Ht '5\' 1"'  (1.549 m)   Wt 149 lb (67.6 kg)   SpO2 96%   BMI 28.15 kg/m   Assessment and Plan:   1. Community acquired pneumonia, unspecified laterality New onset.  Resolving.  Stable.  Gradually improving with antibiotic regimen.  Patient is doing well today with improvement of pulse ox to 95%.  Chest x-ray will be obtained to assess if  the nodularity of the mention coincided with nipple placement.  Auscultation the lungs are clear without rales rhonchi wheezes or rub and heart exam cardiac exam is normal - DG Chest 2 View  2. Abnormal chest x-ray As noted above there is improvement on the stranding to suggest that there is been gradual improvement of the patient's community-acquired pneumonia.  In the meantime there was noted to have a possible nodule but this may coincide with a nipple shadow and we will proceed with nipple markers and repeat chest x-ray to see if this is an actual finding or further evaluation of nodule. - DG Chest 2 View  3. Nevus Patient has a nevus that she brought up about the way which is pruritic we will refer to dermatology for evaluation and treatment thereof. - Ambulatory referral to Dermatology   Otilio Miu, MD

## 2021-11-09 ENCOUNTER — Telehealth: Payer: Self-pay | Admitting: Family Medicine

## 2021-11-09 NOTE — Telephone Encounter (Signed)
Copied from [place] [id number]. Topic: General - Inquiry >> Nov 09, 2021  7:45 AM Marcellus Scott wrote: Reason for CRM: Pt stated Dr. Ronnald Ramp wrote her a work note; however, the date to return to work is incorrect; it was supposed to be October 23rd, and she wrote October 24th.  Pt is asking for the work note to be rewritten. Pt is asking for a letter to be put on MyChart.  Please advise.

## 2021-11-13 ENCOUNTER — Ambulatory Visit: Payer: Medicare HMO | Admitting: Family Medicine

## 2021-12-19 ENCOUNTER — Encounter: Payer: Self-pay | Admitting: Family Medicine

## 2021-12-19 ENCOUNTER — Ambulatory Visit (INDEPENDENT_AMBULATORY_CARE_PROVIDER_SITE_OTHER): Payer: Medicare HMO | Admitting: Family Medicine

## 2021-12-19 ENCOUNTER — Ambulatory Visit: Payer: Self-pay | Admitting: *Deleted

## 2021-12-19 VITALS — BP 170/110 | HR 110 | Temp 102.8°F | Ht 61.0 in | Wt 150.0 lb

## 2021-12-19 DIAGNOSIS — J0140 Acute pansinusitis, unspecified: Secondary | ICD-10-CM

## 2021-12-19 MED ORDER — AZITHROMYCIN 250 MG PO TABS: ORAL_TABLET | ORAL | 0 refills | Status: DC

## 2021-12-19 MED ORDER — PROMETHAZINE-DM 6.25-15 MG/5ML PO SYRP: 5.0000 mL | ORAL_SOLUTION | Freq: Four times a day (QID) | ORAL | 0 refills | Status: DC | PRN

## 2021-12-19 NOTE — Telephone Encounter (Signed)
Summary: sinus headache, runny nose and eyes   Pt states she has a sinus headache, runny nose and eyes  Providers next availability is 12-8  Please assist further       Reason for Disposition  [1] SEVERE pain AND [2] not improved 2 hours after pain medicine  Answer Assessment - Initial Assessment Questions 1. LOCATION: "Where does it hurt?"      Facial area, back pain 2. ONSET: "When did the sinus pain start?"  (e.g., hours, days)      Sinus headache- eyes, ears 3. SEVERITY: "How bad is the pain?"   (Scale 1-10; mild, moderate or severe)   - MILD (1-3): doesn't interfere with normal activities    - MODERATE (4-7): interferes with normal activities (e.g., work or school) or awakens from sleep   - SEVERE (8-10): excruciating pain and patient unable to do any normal activities        severe 4. RECURRENT SYMPTOM: "Have you ever had sinus problems before?" If Yes, ask: "When was the last time?" and "What happened that time?"      Yes- every year 5. NASAL CONGESTION: "Is the nose blocked?" If Yes, ask: "Can you open it or must you breathe through your mouth?"     Drainage only 6. NASAL DISCHARGE: "Do you have discharge from your nose?" If so ask, "What color?"     Light yellow 7. FEVER: "Do you have a fever?" If Yes, ask: "What is it, how was it measured, and when did it start?"      no 8. OTHER SYMPTOMS: "Do you have any other symptoms?" (e.g., sore throat, cough, earache, difficulty breathing)     Sore throat, cough, ear pain- bilateral 9. PREGNANCY: "Is there any chance you are pregnant?" "When was your last menstrual period?"     na  Protocols used: Sinus Pain or Congestion-A-AH

## 2021-12-19 NOTE — Telephone Encounter (Signed)
  Chief Complaint: sinus symptoms Symptoms: sinus headache, eye and ear pain, cough, sore throat Frequency: started yesterday Pertinent Negatives: Patient denies fever Disposition: '[]'$ ED /'[]'$ Urgent Care (no appt availability in office) / '[x]'$ Appointment(In office/virtual)/ '[]'$  [facility]/ '[]'$ Home Care/ '[]'$ Refused Recommended Disposition /'[]'$ [facility]/ '[]'$  Follow-up with PCP Additional Notes:

## 2021-12-19 NOTE — Patient Instructions (Signed)
-   Start azithromycin, take for full course - Dose Mucinex (guaifenesin) 12-hour formulation, dosed twice daily while on antibiotics - Use Flonase (fluticasone propionate), 2 sprays in each nostril daily while on antibiotics - Can use Rx cough medicine as needed - Recommend plenty of water, rest, vitamin C, and vitamin D - Remain out of work until Monday - Follow-up in our clinic on Monday

## 2021-12-22 DIAGNOSIS — Z888 Allergy status to other drugs, medicaments and biological substances status: Secondary | ICD-10-CM | POA: Diagnosis not present

## 2021-12-22 DIAGNOSIS — E785 Hyperlipidemia, unspecified: Secondary | ICD-10-CM | POA: Diagnosis not present

## 2021-12-22 DIAGNOSIS — R5383 Other fatigue: Secondary | ICD-10-CM | POA: Diagnosis not present

## 2021-12-22 DIAGNOSIS — Z20822 Contact with and (suspected) exposure to covid-19: Secondary | ICD-10-CM | POA: Diagnosis not present

## 2021-12-22 DIAGNOSIS — Z880 Allergy status to penicillin: Secondary | ICD-10-CM | POA: Diagnosis not present

## 2021-12-22 DIAGNOSIS — R6883 Chills (without fever): Secondary | ICD-10-CM | POA: Diagnosis not present

## 2021-12-22 DIAGNOSIS — R509 Fever, unspecified: Secondary | ICD-10-CM | POA: Diagnosis not present

## 2021-12-22 DIAGNOSIS — J069 Acute upper respiratory infection, unspecified: Secondary | ICD-10-CM | POA: Diagnosis not present

## 2021-12-22 DIAGNOSIS — E860 Dehydration: Secondary | ICD-10-CM | POA: Diagnosis not present

## 2021-12-22 DIAGNOSIS — I10 Essential (primary) hypertension: Secondary | ICD-10-CM | POA: Diagnosis not present

## 2021-12-22 DIAGNOSIS — B974 Respiratory syncytial virus as the cause of diseases classified elsewhere: Secondary | ICD-10-CM | POA: Diagnosis not present

## 2021-12-22 DIAGNOSIS — R0981 Nasal congestion: Secondary | ICD-10-CM | POA: Diagnosis not present

## 2021-12-22 DIAGNOSIS — Z885 Allergy status to narcotic agent status: Secondary | ICD-10-CM | POA: Diagnosis not present

## 2021-12-22 DIAGNOSIS — R059 Cough, unspecified: Secondary | ICD-10-CM | POA: Diagnosis not present

## 2021-12-22 DIAGNOSIS — B338 Other specified viral diseases: Secondary | ICD-10-CM | POA: Diagnosis not present

## 2021-12-24 ENCOUNTER — Ambulatory Visit (INDEPENDENT_AMBULATORY_CARE_PROVIDER_SITE_OTHER): Payer: Medicare HMO | Admitting: Family Medicine

## 2021-12-24 ENCOUNTER — Telehealth: Payer: Self-pay

## 2021-12-24 ENCOUNTER — Encounter: Payer: Self-pay | Admitting: Family Medicine

## 2021-12-24 VITALS — BP 128/78 | HR 70 | Ht 61.0 in | Wt 148.0 lb

## 2021-12-24 DIAGNOSIS — J210 Acute bronchiolitis due to respiratory syncytial virus: Secondary | ICD-10-CM | POA: Diagnosis not present

## 2021-12-24 MED ORDER — METHYLPREDNISOLONE 4 MG PO TBPK: ORAL_TABLET | ORAL | 0 refills | Status: DC

## 2021-12-24 MED ORDER — ALBUTEROL SULFATE HFA 108 (90 BASE) MCG/ACT IN AERS: 2.0000 | INHALATION_SPRAY | Freq: Four times a day (QID) | RESPIRATORY_TRACT | 11 refills | Status: DC | PRN

## 2021-12-24 MED ORDER — BENZONATATE 150 MG PO CAPS: 150.0000 mg | ORAL_CAPSULE | Freq: Three times a day (TID) | ORAL | 0 refills | Status: DC | PRN

## 2021-12-24 NOTE — Telephone Encounter (Signed)
Mickel Baas from [facility] calling to let Dr. Ronnald Ramp know they dont carry the benzonatate mg in stock. Either 100 or 200 mg. Tried to call [facility] x 2 but no answer. Will route to provider for review.

## 2021-12-24 NOTE — Patient Instructions (Addendum)
-   Obtain labs today - Stay out of work until medically cleared - Use cough medicine (Perles) every 8 hours as-needed - Stay hydrated (Gatorade and water) and focus on liquid diet (chicken broth, etc, increase foods as tolerated) - Return in 1 week

## 2021-12-25 ENCOUNTER — Other Ambulatory Visit: Payer: Self-pay | Admitting: Family Medicine

## 2021-12-25 ENCOUNTER — Telehealth: Payer: Self-pay

## 2021-12-25 DIAGNOSIS — J210 Acute bronchiolitis due to respiratory syncytial virus: Secondary | ICD-10-CM

## 2021-12-25 LAB — RENAL FUNCTION PANEL
Albumin: 4.1 g/dL (ref 3.8–4.8)
BUN/Creatinine Ratio: 16 (ref 12–28)
BUN: 16 mg/dL (ref 8–27)
CO2: 24 mmol/L (ref 20–29)
Calcium: 9.5 mg/dL (ref 8.7–10.3)
Chloride: 97 mmol/L (ref 96–106)
Creatinine, Ser: 1.03 mg/dL — ABNORMAL HIGH (ref 0.57–1.00)
Glucose: 98 mg/dL (ref 70–99)
Phosphorus: 3.4 mg/dL (ref 3.0–4.3)
Potassium: 3.6 mmol/L (ref 3.5–5.2)
Sodium: 138 mmol/L (ref 134–144)
eGFR: 57 mL/min/{1.73_m2} — ABNORMAL LOW (ref >59)

## 2021-12-25 MED ORDER — BENZONATATE 100 MG PO CAPS: 100.0000 mg | ORAL_CAPSULE | Freq: Three times a day (TID) | ORAL | 1 refills | Status: DC | PRN

## 2021-12-25 NOTE — Telephone Encounter (Signed)
Mickel Baas the pharmacist at [facility] called back again checking on the status of the benzonatate stating they don't carry '150mg'$  strength only the 100 or 200 and needs to know what the provider wants to do. Please assist further

## 2021-12-25 NOTE — Telephone Encounter (Signed)
Transition Care Management Follow-up Telephone Call Date of discharge and from where: 12/22/21 [facility] ER How have you been since you were released from the hospital? "Feeling better, but still exhausted" Any questions or concerns? No  Items Reviewed: Did the pt receive and understand the discharge instructions provided? Yes  Medications obtained and verified? Yes  Other? No  Any new allergies since your discharge? No  Dietary orders reviewed? Yes Do you have support at home? Yes   Home Care and Equipment/Supplies: Were home health services ordered? no   Functional Questionnaire: (I = Independent and D = Dependent) ADLs: I  Bathing/Dressing- I  Meal Prep- I  Eating- I  Maintaining continence- I  Transferring/Ambulation- I  Managing Meds- I  Follow up appointments reviewed:  PCP Hospital f/u appt confirmed? Yes  Scheduled to see Dr Otilio Miu on 12/28/2021  Specialist Hospital f/u appt confirmed?  Not needed   Are transportation arrangements needed? No  If their condition worsens, is the pt aware to call PCP or go to the Emergency Dept.? Yes Was the patient provided with contact information for the PCP's office or ED? Yes Was to pt encouraged to call back with questions or concerns? Yes

## 2021-12-26 DIAGNOSIS — J210 Acute bronchiolitis due to respiratory syncytial virus: Secondary | ICD-10-CM | POA: Insufficient documentation

## 2021-12-26 LAB — POC COVID19 BINAXNOW: SARS Coronavirus 2 Ag: NEGATIVE

## 2021-12-26 LAB — POCT INFLUENZA A/B
Influenza A, POC: NEGATIVE
Influenza B, POC: NEGATIVE

## 2021-12-26 NOTE — Assessment & Plan Note (Signed)
Return for follow-up, over interim had ER visit due to progressive symptoms, diagnosed with RSV, given IV fluids and tessalon. Has noted slight improvement. Examination with air entry throughout all fields, coarse breath sounds.  Plan for Medrol pak, additional tessalon, PRN albuterol. Follow-up in 1 week with Dr. Ronnald Ramp, PCP, scheduled.

## 2021-12-26 NOTE — Progress Notes (Signed)
     Primary Care / Sports Medicine Office Visit  Patient Information:  Patient ID: Emily Boyer, female DOB: 11/17/1945 Age: 76 y.o. MRN: [id number]   Emily Boyer is a pleasant 76 y.o. female presenting with the following:  Chief Complaint  Patient presents with   RSV    Pt was DX RSV at ER on 11/22/2021.     Vitals:   12/24/21 1501  BP: 128/78  Pulse: 70  SpO2: 98%   Vitals:   12/24/21 1501  Weight: 148 lb (67.1 kg)  Height: '5\' 1"'$  (1.549 m)   Body mass index is 27.96 kg/m.  No results found.   Independent interpretation of notes and tests performed by another provider:   None  Procedures performed:   None  Pertinent History, Exam, Impression, and Recommendations:   Problem List Items Addressed This Visit       Respiratory   RSV (acute bronchiolitis due to respiratory syncytial virus) - Primary    Return for follow-up, over interim had ER visit due to progressive symptoms, diagnosed with RSV, given IV fluids and tessalon. Has noted slight improvement. Examination with air entry throughout all fields, coarse breath sounds.  Plan for Medrol pak, additional tessalon, PRN albuterol. Follow-up in 1 week with Dr. Ronnald Ramp, PCP, scheduled.      Relevant Medications   albuterol (VENTOLIN HFA) 108 (90 Base) MCG/ACT inhaler   methylPREDNISolone (MEDROL DOSEPAK) 4 MG TBPK tablet   Other Relevant Orders   Renal function panel (Completed)     Orders & Medications Meds ordered this encounter  Medications   DISCONTD: benzonatate 150 MG CAPS    Sig: Take 1 capsule (150 mg total) by mouth 3 (three) times daily as needed for cough.    Dispense:  21 capsule    Refill:  0   albuterol (VENTOLIN HFA) 108 (90 Base) MCG/ACT inhaler    Sig: Inhale 2 puffs into the lungs every 6 (six) hours as needed for wheezing.    Dispense:  2 each    Refill:  11    Please use generic pro-air   methylPREDNISolone (MEDROL DOSEPAK) 4 MG TBPK tablet    Sig: Take for full course per  package instructions    Dispense:  21 tablet    Refill:  0   Orders Placed This Encounter  Procedures   Renal function panel     Return in about 1 week (around 12/31/2021).     Montel Culver, MD, [facility]   Primary Care Sports Medicine Primary Care and Sports Medicine at [facility]

## 2021-12-26 NOTE — Assessment & Plan Note (Addendum)
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 Examination with diffuse sinus tenderness, equal air entry throughout. COVID and Flu negative. Plan for azithromycin, Rx antitussive, and supportive care. Given symptom presentation, close follow-up on Monday advised.

## 2021-12-26 NOTE — Progress Notes (Signed)
     Primary Care / Sports Medicine Office Visit  Patient Information:  Patient ID: Emily Boyer, female DOB: 19-Nov-1945 Age: 76 y.o. MRN: [id number]   Emily Boyer is a pleasant 76 y.o. female presenting with the following:  Chief Complaint  Patient presents with   Sinusitis    Has a lot of pressure in head, eyes and nasal. Since waking up yesterday.     Vitals:   12/19/21 0944 12/19/21 0946  BP: (!) 160/100 (!) 170/110  Pulse: (!) 110   Temp: (!) 102.8 F (39.3 C)   SpO2: 97%    Vitals:   12/19/21 0944  Weight: 150 lb (68 kg)  Height: '5\' 1"'$  (1.549 m)   Body mass index is 28.34 kg/m.  No results found.   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 Examination with diffuse sinus tenderness, equal air entry throughout. COVID and Flu negative. Plan for azithromycin, Rx antitussive, and supportive care. Given symptom presentation, close follow-up on Monday advised.      Relevant Orders   POCT Influenza A/B (Completed)   POC COVID-19 (Completed)     Orders & Medications Meds ordered this encounter  Medications   DISCONTD: azithromycin (ZITHROMAX) 250 MG tablet    Sig: Take 2 tablets on day 1, then 1 tablet daily on days 2 through 5    Dispense:  6 tablet    Refill:  0   DISCONTD: promethazine-dextromethorphan (PROMETHAZINE-DM) 6.25-15 MG/5ML syrup    Sig: Take 5 mLs by mouth 4 (four) times daily as needed for cough.    Dispense:  118 mL    Refill:  0   Orders Placed This Encounter  Procedures   POCT Influenza A/B   POC COVID-19     Return in 5 days (on 12/24/2021) for Needs 12/4 visit with Ronnald Ramp or Zigmund Daniel.     Montel Culver, MD, [facility]   Primary Care Sports Medicine Primary Care and Sports Medicine at [facility]

## 2021-12-28 ENCOUNTER — Ambulatory Visit (INDEPENDENT_AMBULATORY_CARE_PROVIDER_SITE_OTHER): Payer: Medicare HMO | Admitting: Family Medicine

## 2021-12-28 ENCOUNTER — Encounter: Payer: Self-pay | Admitting: Family Medicine

## 2021-12-28 VITALS — BP 110/68 | HR 62 | Ht 61.0 in | Wt 146.0 lb

## 2021-12-28 DIAGNOSIS — R9389 Abnormal findings on diagnostic imaging of other specified body structures: Secondary | ICD-10-CM | POA: Diagnosis not present

## 2021-12-28 DIAGNOSIS — J4520 Mild intermittent asthma, uncomplicated: Secondary | ICD-10-CM | POA: Diagnosis not present

## 2021-12-28 DIAGNOSIS — J210 Acute bronchiolitis due to respiratory syncytial virus: Secondary | ICD-10-CM | POA: Diagnosis not present

## 2021-12-28 NOTE — Progress Notes (Signed)
Date:  12/28/2021   Name:  Emily Boyer   DOB:  1945-11-05   MRN:  [id number]   Chief Complaint: Hospitalization Follow-up (RSV in hospital on 12/22/21- TOC on 12/25/21)  Patient is a 76 year old fewmale who presents for a RSV reevaluation exam. The patient reports the following problems: improved respiatory symptoms. Health maintenance has been reviewed up to date.      Lab Results  Component Value Date   NA 138 12/24/2021   K 3.6 12/24/2021   CO2 24 12/24/2021   GLUCOSE 98 12/24/2021   BUN 16 12/24/2021   CREATININE 1.03 (H) 12/24/2021   CALCIUM 9.5 12/24/2021   EGFR 57 (L) 12/24/2021   GFRNONAA >60 11/06/2021   Lab Results  Component Value Date   CHOL 239 (H) 08/01/2021   HDL 40 08/01/2021   LDLCALC 158 (H) 08/01/2021   TRIG 221 (H) 08/01/2021   CHOLHDL 5.3 (H) 12/04/2016   Lab Results  Component Value Date   TSH 1.240 08/20/2019   No results found for: "HGBA1C" Lab Results  Component Value Date   WBC 10.3 11/06/2021   HGB 11.9 (L) 11/06/2021   HCT 35.8 (L) 11/06/2021   MCV 87.5 11/06/2021   PLT 514 (H) 11/06/2021   Lab Results  Component Value Date   ALT 31 10/29/2021   AST 40 10/29/2021   ALKPHOS 98 10/29/2021   BILITOT 0.9 10/29/2021   No results found for: "25OHVITD2", "25OHVITD3", "VD25OH"   Review of Systems  Constitutional:  Negative for chills, fatigue and fever.  HENT:  Negative for sneezing and sore throat.   Respiratory:  Negative for cough, shortness of breath and wheezing.   Cardiovascular:  Negative for chest pain, palpitations and leg swelling.    Patient Active Problem List   Diagnosis Date Noted   RSV (acute bronchiolitis due to respiratory syncytial virus) 12/26/2021   Acute pansinusitis 12/19/2021   Sepsis due to pneumonia ([place]) 10/30/2021   CAP (community acquired pneumonia) 10/30/2021   H/O total hysterectomy 03/03/2020   DDD (degenerative disc disease), cervical 05/28/2018   Sebaceous cyst 12/20/2015   Abnormal  findings-gastrointestinal tract    Encounter for general adult medical examination without abnormal findings 07/04/2014   Nerve root pain 07/04/2014   Gastroesophageal reflux disease 07/04/2014   HLD (hyperlipidemia) 07/04/2014   Hypertension 07/04/2014    Allergies  Allergen Reactions   Penicillins Itching and Hives   Guaifenesin & Derivatives    Methocarbamol Itching   Tramadol Hcl Itching    Past Surgical History:  Procedure Laterality Date   BREAST BIOPSY Right 2004   neg   CATARACT EXTRACTION W/PHACO Right 04/29/2017   Procedure: CATARACT EXTRACTION PHACO AND INTRAOCULAR LENS PLACEMENT ([place]) RIGHT;  Surgeon: Eulogio Bear, MD;  Location: [place];  Service: Ophthalmology;  Laterality: Right;   CATARACT EXTRACTION W/PHACO Left 07/15/2017   Procedure: CATARACT EXTRACTION PHACO AND INTRAOCULAR LENS PLACEMENT ([place]) LEFT;  Surgeon: Eulogio Bear, MD;  Location: [facility];  Service: Ophthalmology;  Laterality: Left;   COLONOSCOPY WITH PROPOFOL N/A 02/06/2015   Procedure: COLONOSCOPY WITH PROPOFOL;  Surgeon: [name], MD;  Location: [place];  Service: Endoscopy;  Laterality: N/A;   EYE SURGERY  2019   TUBAL LIGATION  1980s   VAGINAL HYSTERECTOMY  1990s   one ovary removed as well    Social History   Tobacco Use   Smoking status: Former    Packs/day: 0.50    Years: 50.00  Total pack years: 25.00    Types: Cigarettes, E-cigarettes    Quit date: 11/22/2011    Years since quitting: 10.1   Smokeless tobacco: Never   Tobacco comments:    quit 2014  Vaping Use   Vaping Use: Never used  Substance Use Topics   Alcohol use: Yes    Alcohol/week: 2.0 standard drinks of alcohol    Types: 2 Glasses of wine per week   Drug use: No     Medication list has been reviewed and updated.  Current Meds  Medication Sig   acetaminophen (TYLENOL) 500 MG tablet Take 500 mg by mouth every 6 (six) hours as needed.   albuterol (VENTOLIN HFA) 108  (90 Base) MCG/ACT inhaler Inhale 2 puffs into the lungs every 6 (six) hours as needed for wheezing.   aspirin EC 81 MG tablet Take by mouth.   atorvastatin (LIPITOR) 10 MG tablet Take 10 mg by mouth daily.   benzonatate (TESSALON PERLES) 100 MG capsule Take 1-2 capsules (100-200 mg total) by mouth 3 (three) times daily as needed for cough.   cholecalciferol (VITAMIN D) 1000 units tablet Take 1,000 Units by mouth daily.   losartan-hydrochlorothiazide (HYZAAR) 50-12.5 MG tablet Take 1 tablet by mouth daily.   methylPREDNISolone (MEDROL DOSEPAK) 4 MG TBPK tablet Take for full course per package instructions   pantoprazole (PROTONIX) 40 MG tablet Take 1 tablet (40 mg total) by mouth daily.   Zinc Oxide-Vitamin C (ZINC PLUS VITAMIN C PO) Take 1 tablet by mouth daily.       12/28/2021   11:07 AM 11/08/2021   11:34 AM 11/06/2021    3:02 PM 08/01/2021    8:19 AM  GAD 7 : Generalized Anxiety Score  Nervous, Anxious, on Edge 0 0 0 0  Control/stop worrying 0 0 0 0  Worry too much - different things 0 0 0 0  Trouble relaxing 0 0 0 0  Restless 0 0 0 0  Easily annoyed or irritable 0 0 0 0  Afraid - awful might happen 0 0 0 0  Total GAD 7 Score 0 0 0 0  Anxiety Difficulty Not difficult at all Not difficult at all Not difficult at all Not difficult at all       12/28/2021   11:07 AM 11/08/2021   11:34 AM 11/06/2021    3:01 PM  Depression screen PHQ 2/9  Decreased Interest 0 0 0  Down, Depressed, Hopeless 0 0 0  PHQ - 2 Score 0 0 0  Altered sleeping 0 0 0  Tired, decreased energy 0 0 3  Change in appetite 0 0 0  Feeling bad or failure about yourself  0 0 0  Trouble concentrating 0 0 0  Moving slowly or fidgety/restless 0 0 0  Suicidal thoughts 0 0 0  PHQ-9 Score 0 0 3  Difficult doing work/chores Not difficult at all Not difficult at all Somewhat difficult    BP Readings from Last 3 Encounters:  12/28/21 110/68  12/24/21 128/78  12/19/21 (!) 170/110    Physical Exam Vitals and  nursing note reviewed. Exam conducted with a chaperone present.  Constitutional:      General: She is not in acute distress.    Appearance: She is not diaphoretic.  HENT:     Head: Normocephalic and atraumatic.     Right Ear: Tympanic membrane and external ear normal.     Left Ear: Tympanic membrane and external ear normal.     Nose: Nose  normal.  Eyes:     General:        Right eye: No discharge.        Left eye: No discharge.     Conjunctiva/sclera: Conjunctivae normal.     Pupils: Pupils are equal, round, and reactive to light.  Neck:     Thyroid: No thyromegaly.     Vascular: No JVD.  Cardiovascular:     Rate and Rhythm: Normal rate and regular rhythm.     Heart sounds: Normal heart sounds. No murmur heard.    No friction rub. No gallop.  Pulmonary:     Effort: Pulmonary effort is normal.     Breath sounds: No stridor. No decreased breath sounds, wheezing, rhonchi or rales.  Abdominal:     General: Bowel sounds are normal.     Palpations: Abdomen is soft. There is no mass.     Tenderness: There is no abdominal tenderness. There is no guarding.  Musculoskeletal:        General: Normal range of motion.     Cervical back: Normal range of motion and neck supple.  Lymphadenopathy:     Cervical: No cervical adenopathy.  Skin:    General: Skin is warm and dry.  Neurological:     Mental Status: She is alert.     Deep Tendon Reflexes: Reflexes are normal and symmetric.     Wt Readings from Last 3 Encounters:  12/28/21 146 lb (66.2 kg)  12/24/21 148 lb (67.1 kg)  12/19/21 150 lb (68 kg)    BP 110/68   Pulse 62   Ht _0  (1.549 m)   Wt 146 lb (66.2 kg)   SpO2 97%   BMI 27.59 kg/m   Assessment and Plan:  1. RSV (acute bronchiolitis due to respiratory syncytial virus) Recent RSV infection which is now resolving on combination of your welcome albuterol inhaler and prednisone taper.  Patient is doing significantly better compared to 4 days ago.  Will continue with an  inhaler and continue with completion of prednisone taper.  Review of chest x-ray that was noted to have some residual clearing in the left lower and lingual area.  Will recheck chest x-ray in 2 weeks to allow RSV to further resolved.  2. Mild intermittent reactive airway disease without complication New onset mild intermittent resolving on inhalers and prednisone will continue course until completion.  3. Abnormal chest x-ray As noted above we will repeat chest x-ray in 2 weeks.   Otilio Miu, MD

## 2022-01-01 ENCOUNTER — Ambulatory Visit: Payer: Medicare HMO | Admitting: Family Medicine

## 2022-01-04 DIAGNOSIS — H04123 Dry eye syndrome of bilateral lacrimal glands: Secondary | ICD-10-CM | POA: Diagnosis not present

## 2022-01-25 ENCOUNTER — Other Ambulatory Visit: Payer: Self-pay | Admitting: Family Medicine

## 2022-01-25 DIAGNOSIS — M5412 Radiculopathy, cervical region: Secondary | ICD-10-CM

## 2022-01-29 ENCOUNTER — Ambulatory Visit
Admission: RE | Admit: 2022-01-29 | Discharge: 2022-01-29 | Disposition: A | Discharge status: Home / Self Care | Payer: Medicare HMO | Attending: Family Medicine | Admitting: Family Medicine

## 2022-01-29 ENCOUNTER — Ambulatory Visit
Admission: RE | Admit: 2022-01-29 | Discharge: 2022-01-29 | Disposition: A | Discharge status: Home / Self Care | Payer: Medicare HMO | Source: Ambulatory Visit | Attending: Family Medicine | Admitting: Family Medicine

## 2022-01-29 ENCOUNTER — Encounter: Payer: Self-pay | Admitting: Family Medicine

## 2022-01-29 ENCOUNTER — Ambulatory Visit (INDEPENDENT_AMBULATORY_CARE_PROVIDER_SITE_OTHER): Payer: Medicare HMO | Admitting: Family Medicine

## 2022-01-29 ENCOUNTER — Other Ambulatory Visit: Payer: Self-pay

## 2022-01-29 ENCOUNTER — Telehealth: Payer: Self-pay | Admitting: Family Medicine

## 2022-01-29 VITALS — BP 120/70 | HR 62 | Ht 61.0 in | Wt 144.0 lb

## 2022-01-29 DIAGNOSIS — J210 Acute bronchiolitis due to respiratory syncytial virus: Secondary | ICD-10-CM

## 2022-01-29 DIAGNOSIS — E782 Mixed hyperlipidemia: Secondary | ICD-10-CM

## 2022-01-29 DIAGNOSIS — R0602 Shortness of breath: Secondary | ICD-10-CM

## 2022-01-29 DIAGNOSIS — R509 Fever, unspecified: Secondary | ICD-10-CM | POA: Diagnosis not present

## 2022-01-29 DIAGNOSIS — J189 Pneumonia, unspecified organism: Secondary | ICD-10-CM | POA: Diagnosis not present

## 2022-01-29 DIAGNOSIS — R059 Cough, unspecified: Secondary | ICD-10-CM | POA: Diagnosis not present

## 2022-01-29 DIAGNOSIS — R053 Chronic cough: Secondary | ICD-10-CM

## 2022-01-29 DIAGNOSIS — Z8619 Personal history of other infectious and parasitic diseases: Secondary | ICD-10-CM

## 2022-01-29 DIAGNOSIS — I10 Essential (primary) hypertension: Secondary | ICD-10-CM

## 2022-01-29 DIAGNOSIS — K219 Gastro-esophageal reflux disease without esophagitis: Secondary | ICD-10-CM

## 2022-01-29 MED ORDER — AZITHROMYCIN 250 MG PO TABS: ORAL_TABLET | ORAL | 0 refills | Status: AC

## 2022-01-29 MED ORDER — PANTOPRAZOLE SODIUM 40 MG PO TBEC: 40.0000 mg | DELAYED_RELEASE_TABLET | Freq: Every day | ORAL | 1 refills | Status: DC

## 2022-01-29 MED ORDER — LOSARTAN POTASSIUM-HCTZ 50-12.5 MG PO TABS: 1.0000 | ORAL_TABLET | Freq: Every day | ORAL | 1 refills | Status: DC

## 2022-01-29 MED ORDER — BUDESONIDE-FORMOTEROL FUMARATE 160-4.5 MCG/ACT IN AERO: 2.0000 | INHALATION_SPRAY | Freq: Two times a day (BID) | RESPIRATORY_TRACT | 3 refills | Status: DC

## 2022-01-29 MED ORDER — ATORVASTATIN CALCIUM 10 MG PO TABS: 10.0000 mg | ORAL_TABLET | Freq: Every day | ORAL | 1 refills | Status: DC

## 2022-01-29 MED ORDER — ALBUTEROL SULFATE HFA 108 (90 BASE) MCG/ACT IN AERS: 2.0000 | INHALATION_SPRAY | Freq: Four times a day (QID) | RESPIRATORY_TRACT | 11 refills | Status: DC | PRN

## 2022-01-29 NOTE — Telephone Encounter (Signed)
Copied from [place] [id number]. Topic: General - Other >> Jan 29, 2022  3:38 PM Emily Boyer wrote: Reason for CRM: Pt needs a doctor's note for work tomorrow, with the diagnosis the patient wants to know if she should stay out of work the rest of the week?   Please call patient back at [phone]  Wants call back from office

## 2022-01-29 NOTE — Progress Notes (Signed)
Date:  01/29/2022   Name:  Emily Boyer   DOB:  05-12-1945   MRN:  [id number]   Chief Complaint: Cough (Some green production, SOB on exertion- s/p pneumonia and RSV)  Cough This is a recurrent problem. The current episode started more than 1 month ago (3 months). The problem has been gradually worsening. The problem occurs every few minutes. The cough is Non-productive (recenr t productive). Associated symptoms include heartburn and shortness of breath. Pertinent negatives include no chest pain, chills, fever, headaches, hemoptysis, nasal congestion, postnasal drip, rash, rhinorrhea, sore throat or wheezing. Nothing aggravates the symptoms. Risk factors for lung disease include smoking/tobacco exposure ("years ago"). She has tried a beta-agonist inhaler for the symptoms. The treatment provided mild relief. Her past medical history is significant for pneumonia.  Hypertension This is a chronic problem. The problem has been gradually improving since onset. Associated symptoms include shortness of breath. Pertinent negatives include no chest pain, headaches or palpitations. Past treatments include angiotensin blockers and diuretics. The current treatment provides moderate improvement. There are no compliance problems.   Gastroesophageal Reflux She complains of coughing and heartburn. She reports no abdominal pain, no chest pain, no choking, no dysphagia, no nausea, no sore throat or no wheezing. This is a new problem. The problem has been gradually improving. The symptoms are aggravated by certain foods. Pertinent negatives include no melena. She has tried a PPI for the symptoms. The treatment provided moderate relief.  Hyperlipidemia The problem is controlled. Associated symptoms include shortness of breath. Pertinent negatives include no chest pain. Current antihyperlipidemic treatment includes statins. The current treatment provides moderate improvement of lipids. There are no compliance  problems.  Risk factors for coronary artery disease include hypertension and dyslipidemia.    Lab Results  Component Value Date   NA 138 12/24/2021   K 3.6 12/24/2021   CO2 24 12/24/2021   GLUCOSE 98 12/24/2021   BUN 16 12/24/2021   CREATININE 1.03 (H) 12/24/2021   CALCIUM 9.5 12/24/2021   EGFR 57 (L) 12/24/2021   GFRNONAA >60 11/06/2021   Lab Results  Component Value Date   CHOL 239 (H) 08/01/2021   HDL 40 08/01/2021   LDLCALC 158 (H) 08/01/2021   TRIG 221 (H) 08/01/2021   CHOLHDL 5.3 (H) 12/04/2016   Lab Results  Component Value Date   TSH 1.240 08/20/2019   No results found for: "HGBA1C" Lab Results  Component Value Date   WBC 10.3 11/06/2021   HGB 11.9 (L) 11/06/2021   HCT 35.8 (L) 11/06/2021   MCV 87.5 11/06/2021   PLT 514 (H) 11/06/2021   Lab Results  Component Value Date   ALT 31 10/29/2021   AST 40 10/29/2021   ALKPHOS 98 10/29/2021   BILITOT 0.9 10/29/2021   No results found for: "25OHVITD2", "25OHVITD3", "VD25OH"   Review of Systems  Constitutional:  Negative for chills, diaphoresis and fever.  HENT:  Negative for congestion, nosebleeds, postnasal drip, rhinorrhea, sinus pressure, sore throat, trouble swallowing and voice change.   Respiratory:  Positive for cough and shortness of breath. Negative for hemoptysis, choking, chest tightness, wheezing and stridor.   Cardiovascular:  Negative for chest pain, palpitations and leg swelling.  Gastrointestinal:  Positive for heartburn. Negative for abdominal pain, dysphagia, melena and nausea.  Skin:  Negative for rash.  Neurological:  Negative for headaches.    Patient Active Problem List   Diagnosis Date Noted   RSV (acute bronchiolitis due to respiratory syncytial virus) 12/26/2021  Acute pansinusitis 12/19/2021   Sepsis due to pneumonia ([place]) 10/30/2021   CAP (community acquired pneumonia) 10/30/2021   H/O total hysterectomy 03/03/2020   DDD (degenerative disc disease), cervical 05/28/2018    Sebaceous cyst 12/20/2015   Abnormal findings-gastrointestinal tract    Encounter for general adult medical examination without abnormal findings 07/04/2014   Nerve root pain 07/04/2014   Gastroesophageal reflux disease 07/04/2014   HLD (hyperlipidemia) 07/04/2014   Hypertension 07/04/2014    Allergies  Allergen Reactions   Penicillins Itching and Hives   Guaifenesin & Derivatives    Methocarbamol Itching   Tramadol Hcl Itching    Past Surgical History:  Procedure Laterality Date   BREAST BIOPSY Right 2004   neg   CATARACT EXTRACTION W/PHACO Right 04/29/2017   Procedure: CATARACT EXTRACTION PHACO AND INTRAOCULAR LENS PLACEMENT ([place]) RIGHT;  Surgeon: Eulogio Bear, MD;  Location: [facility];  Service: Ophthalmology;  Laterality: Right;   CATARACT EXTRACTION W/PHACO Left 07/15/2017   Procedure: CATARACT EXTRACTION PHACO AND INTRAOCULAR LENS PLACEMENT ([place]) LEFT;  Surgeon: Eulogio Bear, MD;  Location: [facility];  Service: Ophthalmology;  Laterality: Left;   COLONOSCOPY WITH PROPOFOL N/A 02/06/2015   Procedure: COLONOSCOPY WITH PROPOFOL;  Surgeon: [name], MD;  Location: [facility];  Service: Endoscopy;  Laterality: N/A;   EYE SURGERY  2019   TUBAL LIGATION  1980s   VAGINAL HYSTERECTOMY  1990s   one ovary removed as well    Social History   Tobacco Use   Smoking status: Former    Packs/day: 0.50    Years: 50.00    Total pack years: 25.00    Types: Cigarettes, E-cigarettes    Quit date: 11/22/2011    Years since quitting: 10.1   Smokeless tobacco: Never   Tobacco comments:    quit 2014  Vaping Use   Vaping Use: Never used  Substance Use Topics   Alcohol use: Yes    Alcohol/week: 2.0 standard drinks of alcohol    Types: 2 Glasses of wine per week   Drug use: No     Medication list has been reviewed and updated.  Current Meds  Medication Sig   acetaminophen (TYLENOL) 500 MG tablet Take 500 mg by mouth every 6 (six) hours as  needed.   albuterol (VENTOLIN HFA) 108 (90 Base) MCG/ACT inhaler Inhale 2 puffs into the lungs every 6 (six) hours as needed for wheezing.   aspirin EC 81 MG tablet Take by mouth.   atorvastatin (LIPITOR) 10 MG tablet Take 10 mg by mouth daily.   cholecalciferol (VITAMIN D) 1000 units tablet Take 1,000 Units by mouth daily.   losartan-hydrochlorothiazide (HYZAAR) 50-12.5 MG tablet Take 1 tablet by mouth daily.   pantoprazole (PROTONIX) 40 MG tablet Take 1 tablet (40 mg total) by mouth daily.   Zinc Oxide-Vitamin C (ZINC PLUS VITAMIN C PO) Take 1 tablet by mouth daily.   [DISCONTINUED] benzonatate (TESSALON PERLES) 100 MG capsule Take 1-2 capsules (100-200 mg total) by mouth 3 (three) times daily as needed for cough.       01/29/2022    9:14 AM 12/28/2021   11:07 AM 11/08/2021   11:34 AM 11/06/2021    3:02 PM  GAD 7 : Generalized Anxiety Score  Nervous, Anxious, on Edge 0 0 0 0  Control/stop worrying 0 0 0 0  Worry too much - different things 0 0 0 0  Trouble relaxing 0 0 0 0  Restless 0 0 0 0  Easily  annoyed or irritable 0 0 0 0  Afraid - awful might happen 0 0 0 0  Total GAD 7 Score 0 0 0 0  Anxiety Difficulty Not difficult at all Not difficult at all Not difficult at all Not difficult at all       01/29/2022    9:15 AM 01/29/2022    9:14 AM 12/28/2021   11:07 AM  Depression screen PHQ 2/9  Decreased Interest 0 0 0  Down, Depressed, Hopeless 0 0 0  PHQ - 2 Score 0 0 0  Altered sleeping 0 0 0  Tired, decreased energy 0 0 0  Change in appetite 0 0 0  Feeling bad or failure about yourself  0 0 0  Trouble concentrating 0 0 0  Moving slowly or fidgety/restless 0 0 0  Suicidal thoughts 0 0 0  PHQ-9 Score 0 0 0  Difficult doing work/chores Not difficult at all Not difficult at all Not difficult at all    BP Readings from Last 3 Encounters:  01/29/22 120/70  12/28/21 110/68  12/24/21 128/78    Physical Exam Vitals and nursing note reviewed. Exam conducted with a chaperone  present.  Constitutional:      General: She is not in acute distress.    Appearance: She is not diaphoretic.  HENT:     Head: Normocephalic and atraumatic.     Right Ear: External ear normal.     Left Ear: External ear normal.     Nose: Nose normal. No congestion or rhinorrhea.     Mouth/Throat:     Mouth: Mucous membranes are moist.  Eyes:     General:        Right eye: No discharge.        Left eye: No discharge.     Conjunctiva/sclera: Conjunctivae normal.     Pupils: Pupils are equal, round, and reactive to light.  Neck:     Thyroid: No thyromegaly.     Vascular: No JVD.  Cardiovascular:     Rate and Rhythm: Normal rate and regular rhythm.     Heart sounds: Normal heart sounds. No murmur heard.    No friction rub. No gallop.  Pulmonary:     Effort: Pulmonary effort is normal.     Breath sounds: Normal breath sounds. No wheezing, rhonchi or rales.  Abdominal:     General: Bowel sounds are normal.     Palpations: Abdomen is soft. There is no mass.     Tenderness: There is no abdominal tenderness. There is no guarding.  Musculoskeletal:        General: Normal range of motion.     Cervical back: Normal range of motion and neck supple.  Lymphadenopathy:     Cervical: No cervical adenopathy.  Skin:    General: Skin is warm and dry.  Neurological:     Mental Status: She is alert.     Deep Tendon Reflexes: Reflexes are normal and symmetric.     Wt Readings from Last 3 Encounters:  01/29/22 144 lb (65.3 kg)  12/28/21 146 lb (66.2 kg)  12/24/21 148 lb (67.1 kg)    BP 120/70   Pulse 62   Ht '5\' 1"'$  (1.549 m)   Wt 144 lb (65.3 kg)   SpO2 95%   BMI 27.21 kg/m   Assessment and Plan: 1. Shortness of breath Relatively new onset since the hospitalization for RSV pneumonia which there was a time that it seemed to be improving but never  recovered completely and then a increase in symptoms of shortness of breath cough and general repeat of the previous infection  circumstance.  We will continue the Symbicort 2 puffs twice a day as well as the albuterol inhaler or nebulizer.  We will obtain a chest x-ray to see if there is been further clearance of the area of concern and if not consider a CT scan.  We will also place a referral to pulmonary if there is any postinfectious pneumonitis or changes that may be persisting and causing symptomatology. - DG Chest 2 View - Ambulatory referral to Pulmonology - budesonide-formoterol (SYMBICORT) 160-4.5 MCG/ACT inhaler; Inhale 2 puffs into the lungs 2 (two) times daily.  Dispense: 1 each; Refill: 3 - albuterol (VENTOLIN HFA) 108 (90 Base) MCG/ACT inhaler; Inhale 2 puffs into the lungs every 6 (six) hours as needed for wheezing.  Dispense: 2 each; Refill: 11  2. History of respiratory syncytial virus (RSV) infection As noted patient in October was admitted for RSV infection and has not quite cleared up from the previous. - DG Chest 2 View - Ambulatory referral to Pulmonology - budesonide-formoterol (SYMBICORT) 160-4.5 MCG/ACT inhaler; Inhale 2 puffs into the lungs 2 (two) times daily.  Dispense: 1 each; Refill: 3 - albuterol (VENTOLIN HFA) 108 (90 Base) MCG/ACT inhaler; Inhale 2 puffs into the lungs every 6 (six) hours as needed for wheezing.  Dispense: 2 each; Refill: 11  3. Chronic cough As noted above. - DG Chest 2 View - budesonide-formoterol (SYMBICORT) 160-4.5 MCG/ACT inhaler; Inhale 2 puffs into the lungs 2 (two) times daily.  Dispense: 1 each; Refill: 3 - albuterol (VENTOLIN HFA) 108 (90 Base) MCG/ACT inhaler; Inhale 2 puffs into the lungs every 6 (six) hours as needed for wheezing.  Dispense: 2 each; Refill: 11  4. RSV (acute bronchiolitis due to respiratory syncytial virus) Noted - DG Chest 2 View - albuterol (VENTOLIN HFA) 108 (90 Base) MCG/ACT inhaler; Inhale 2 puffs into the lungs every 6 (six) hours as needed for wheezing.  Dispense: 2 each; Refill: 11  5. Essential hypertension Chronic.  Controlled.   Stable.  Blood pressure today 120/70.  Continuing losartan hydrochlorothiazide 50-12.5 mg once a day.  We will check renal function panel in 6 months. - losartan-hydrochlorothiazide (HYZAAR) 50-12.5 MG tablet; Take 1 tablet by mouth daily.  Dispense: 90 tablet; Refill: 1  6. Gastroesophageal reflux disease, unspecified whether esophagitis present Chronic.  Controlled.  Stable.  Patient is doing well on current dosing of pantoprazole 40 mg once a day.  Which should be controlling the reflux aspect but this is also something that we may need to take in consideration in the future. - pantoprazole (PROTONIX) 40 MG tablet; Take 1 tablet (40 mg total) by mouth daily.  Dispense: 90 tablet; Refill: 1  7. Mixed hyperlipidemia Chronic.  Controlled.  Stable.  Continue atorvastatin 10 mg once a day.  Will check lipid panel in 6 months. - atorvastatin (LIPITOR) 10 MG tablet; Take 1 tablet (10 mg total) by mouth daily.  Dispense: 90 tablet; Refill: 1   8.  Pneumonia-in the right upper lobe there is a air space disease with nodular features which may represent an early or resolving pneumonia.  We will treat with a Z-Pak in case there is a micro has not like organism and follow-up in 4 to 6 weeks.  In the meantime we have placed a referral to pulmonary for further evaluation and if necessary they may want to take a look at this to rule  out an underlying nodule that may be causing issues  Otilio Miu, MD

## 2022-01-30 DIAGNOSIS — R053 Chronic cough: Secondary | ICD-10-CM | POA: Diagnosis not present

## 2022-02-01 ENCOUNTER — Ambulatory Visit: Payer: Medicare HMO | Admitting: Family Medicine

## 2022-02-13 ENCOUNTER — Other Ambulatory Visit: Payer: Self-pay | Admitting: Specialist

## 2022-02-13 ENCOUNTER — Ambulatory Visit: Payer: Medicare HMO

## 2022-02-13 DIAGNOSIS — R053 Chronic cough: Secondary | ICD-10-CM

## 2022-02-13 DIAGNOSIS — R634 Abnormal weight loss: Secondary | ICD-10-CM | POA: Diagnosis not present

## 2022-02-13 DIAGNOSIS — R918 Other nonspecific abnormal finding of lung field: Secondary | ICD-10-CM

## 2022-02-14 ENCOUNTER — Ambulatory Visit
Admission: RE | Admit: 2022-02-14 | Discharge: 2022-02-14 | Disposition: A | Discharge status: Home / Self Care | Payer: Medicare HMO | Source: Ambulatory Visit | Attending: Specialist | Admitting: Specialist

## 2022-02-14 DIAGNOSIS — R918 Other nonspecific abnormal finding of lung field: Secondary | ICD-10-CM | POA: Diagnosis not present

## 2022-02-14 DIAGNOSIS — R053 Chronic cough: Secondary | ICD-10-CM | POA: Diagnosis not present

## 2022-02-14 DIAGNOSIS — J439 Emphysema, unspecified: Secondary | ICD-10-CM | POA: Diagnosis not present

## 2022-02-19 DIAGNOSIS — R053 Chronic cough: Secondary | ICD-10-CM | POA: Diagnosis not present

## 2022-02-19 DIAGNOSIS — R918 Other nonspecific abnormal finding of lung field: Secondary | ICD-10-CM | POA: Diagnosis not present

## 2022-03-01 DIAGNOSIS — R918 Other nonspecific abnormal finding of lung field: Secondary | ICD-10-CM | POA: Diagnosis not present

## 2022-03-26 ENCOUNTER — Ambulatory Visit
Admission: RE | Admit: 2022-03-26 | Discharge: 2022-03-26 | Disposition: A | Discharge status: Home / Self Care | Payer: Medicare HMO | Source: Ambulatory Visit | Attending: Family Medicine | Admitting: Family Medicine

## 2022-03-26 DIAGNOSIS — M5412 Radiculopathy, cervical region: Secondary | ICD-10-CM

## 2022-03-26 DIAGNOSIS — M4802 Spinal stenosis, cervical region: Secondary | ICD-10-CM | POA: Diagnosis not present

## 2022-03-26 DIAGNOSIS — M47812 Spondylosis without myelopathy or radiculopathy, cervical region: Secondary | ICD-10-CM | POA: Diagnosis not present

## 2022-03-26 MED ORDER — IOPAMIDOL (ISOVUE-M 300) INJECTION 61%
1.0000 mL | Freq: Once | INTRAMUSCULAR | Status: AC | PRN
  Administered 2022-03-26: 1 mL via EPIDURAL

## 2022-03-26 MED ORDER — TRIAMCINOLONE ACETONIDE 40 MG/ML IJ SUSP (RADIOLOGY)
60.0000 mg | Freq: Once | INTRAMUSCULAR | Status: AC
  Administered 2022-03-26: 60 mg via EPIDURAL

## 2022-03-26 NOTE — Discharge Instructions (Signed)

## 2022-03-29 DIAGNOSIS — R9389 Abnormal findings on diagnostic imaging of other specified body structures: Secondary | ICD-10-CM | POA: Diagnosis not present

## 2022-03-29 DIAGNOSIS — I700 Atherosclerosis of aorta: Secondary | ICD-10-CM | POA: Diagnosis not present

## 2022-04-30 ENCOUNTER — Other Ambulatory Visit: Payer: Self-pay | Admitting: Family Medicine

## 2022-04-30 DIAGNOSIS — Z1231 Encounter for screening mammogram for malignant neoplasm of breast: Secondary | ICD-10-CM

## 2022-05-14 ENCOUNTER — Ambulatory Visit
Admission: RE | Admit: 2022-05-14 | Discharge: 2022-05-14 | Disposition: A | Discharge status: Home / Self Care | Payer: Medicare HMO | Source: Ambulatory Visit | Attending: Family Medicine | Admitting: Family Medicine

## 2022-05-14 DIAGNOSIS — Z1231 Encounter for screening mammogram for malignant neoplasm of breast: Secondary | ICD-10-CM | POA: Insufficient documentation

## 2022-07-01 DIAGNOSIS — R053 Chronic cough: Secondary | ICD-10-CM | POA: Diagnosis not present

## 2022-07-01 DIAGNOSIS — J301 Allergic rhinitis due to pollen: Secondary | ICD-10-CM | POA: Diagnosis not present

## 2022-07-01 DIAGNOSIS — J849 Interstitial pulmonary disease, unspecified: Secondary | ICD-10-CM | POA: Diagnosis not present

## 2022-07-01 DIAGNOSIS — R9389 Abnormal findings on diagnostic imaging of other specified body structures: Secondary | ICD-10-CM | POA: Diagnosis not present

## 2022-07-02 ENCOUNTER — Ambulatory Visit
Admission: RE | Admit: 2022-07-02 | Discharge: 2022-07-02 | Disposition: A | Discharge status: Home / Self Care | Payer: Medicare HMO | Source: Ambulatory Visit | Attending: Family Medicine | Admitting: Family Medicine

## 2022-07-02 ENCOUNTER — Ambulatory Visit
Admission: RE | Admit: 2022-07-02 | Discharge: 2022-07-02 | Disposition: A | Discharge status: Home / Self Care | Payer: Medicare HMO | Attending: Family Medicine | Admitting: Family Medicine

## 2022-07-02 ENCOUNTER — Encounter: Payer: Self-pay | Admitting: Family Medicine

## 2022-07-02 ENCOUNTER — Ambulatory Visit (INDEPENDENT_AMBULATORY_CARE_PROVIDER_SITE_OTHER): Payer: Medicare HMO | Admitting: Family Medicine

## 2022-07-02 VITALS — BP 120/74 | HR 66 | Ht 61.0 in | Wt 150.0 lb

## 2022-07-02 DIAGNOSIS — M545 Low back pain, unspecified: Secondary | ICD-10-CM | POA: Diagnosis not present

## 2022-07-02 DIAGNOSIS — M47816 Spondylosis without myelopathy or radiculopathy, lumbar region: Secondary | ICD-10-CM | POA: Diagnosis not present

## 2022-07-02 DIAGNOSIS — M25552 Pain in left hip: Secondary | ICD-10-CM | POA: Diagnosis not present

## 2022-07-02 DIAGNOSIS — M79652 Pain in left thigh: Secondary | ICD-10-CM | POA: Insufficient documentation

## 2022-07-02 MED ORDER — PREDNISONE 10 MG PO TABS: 10.0000 mg | ORAL_TABLET | Freq: Every day | ORAL | 0 refills | Status: DC

## 2022-07-02 NOTE — Progress Notes (Signed)
Date:  07/02/2022   Name:  Emily Boyer   DOB:  [birth year]   MRN:  [id number]   Chief Complaint: Hip Pain (Started 6 days ago when she woke up)  Hip Pain  The incident occurred 5 to 7 days ago. There was no injury mechanism. The pain is present in the left hip and left thigh (lateral proximal). The pain is at a severity of 8/10. The pain is moderate. The pain has been Constant since onset. Associated symptoms include an inability to bear weight and a loss of motion. The symptoms are aggravated by movement, palpation and weight bearing.    Lab Results  Component Value Date   NA 138 12/24/2021   K 3.6 12/24/2021   CO2 24 12/24/2021   GLUCOSE 98 12/24/2021   BUN 16 12/24/2021   CREATININE 1.03 (H) 12/24/2021   CALCIUM 9.5 12/24/2021   EGFR 57 (L) 12/24/2021   GFRNONAA >60 11/06/2021   Lab Results  Component Value Date   CHOL 239 (H) 08/01/2021   HDL 40 08/01/2021   LDLCALC 158 (H) 08/01/2021   TRIG 221 (H) 08/01/2021   CHOLHDL 5.3 (H) 12/04/2016   Lab Results  Component Value Date   TSH 1.240 08/20/2019   No results found for: "HGBA1C" Lab Results  Component Value Date   WBC 10.3 11/06/2021   HGB 11.9 (L) 11/06/2021   HCT 35.8 (L) 11/06/2021   MCV 87.5 11/06/2021   PLT 514 (H) 11/06/2021   Lab Results  Component Value Date   ALT 31 10/29/2021   AST 40 10/29/2021   ALKPHOS 98 10/29/2021   BILITOT 0.9 10/29/2021   No results found for: "25OHVITD2", "25OHVITD3", "VD25OH"   Review of Systems  Constitutional: Negative.  Negative for chills, fatigue, fever and unexpected weight change.  HENT:  Negative for congestion, ear discharge, ear pain, rhinorrhea, sinus pressure, sneezing and sore throat.   Respiratory:  Negative for cough, shortness of breath, wheezing and stridor.   Gastrointestinal:  Negative for abdominal pain, blood in stool, constipation, diarrhea and nausea.  Genitourinary:  Negative for dysuria, flank pain, frequency, hematuria, urgency and  vaginal discharge.  Musculoskeletal:  Positive for myalgias. Negative for arthralgias and back pain.  Skin:  Negative for rash.  Neurological:  Negative for dizziness, weakness and headaches.  Hematological:  Negative for adenopathy. Does not bruise/bleed easily.  Psychiatric/Behavioral:  Negative for dysphoric mood. The patient is not nervous/anxious.     Patient Active Problem List   Diagnosis Date Noted   RSV (acute bronchiolitis due to respiratory syncytial virus) 12/26/2021   Acute pansinusitis 12/19/2021   Sepsis due to pneumonia (HCC) 10/30/2021   CAP (community acquired pneumonia) 10/30/2021   H/O total hysterectomy 03/03/2020   DDD (degenerative disc disease), cervical 05/28/2018   Sebaceous cyst 12/20/2015   Abnormal findings-gastrointestinal tract    Encounter for general adult medical examination without abnormal findings 07/04/2014   Nerve root pain 07/04/2014   Gastroesophageal reflux disease 07/04/2014   HLD (hyperlipidemia) 07/04/2014   Hypertension 07/04/2014    Allergies  Allergen Reactions   Penicillins Itching and Hives   Guaifenesin & Derivatives    Methocarbamol Itching   Tramadol Hcl Itching    Past Surgical History:  Procedure Laterality Date   BREAST BIOPSY Right 2004   neg   CATARACT EXTRACTION W/PHACO Right 04/29/2017   Procedure: CATARACT EXTRACTION PHACO AND INTRAOCULAR LENS PLACEMENT (IOC) RIGHT;  Surgeon: Nevada Crane, MD;  Location: [facility];  Service:  Ophthalmology;  Laterality: Right;   CATARACT EXTRACTION W/PHACO Left 07/15/2017   Procedure: CATARACT EXTRACTION PHACO AND INTRAOCULAR LENS PLACEMENT (IOC) LEFT;  Surgeon: Nevada Crane, MD;  Location: [facility];  Service: Ophthalmology;  Laterality: Left;   COLONOSCOPY WITH PROPOFOL N/A 02/06/2015   Procedure: COLONOSCOPY WITH PROPOFOL;  Surgeon: Midge Minium, MD;  Location: [facility];  Service: Endoscopy;  Laterality: N/A;   EYE SURGERY  2019   TUBAL  LIGATION  1980s   VAGINAL HYSTERECTOMY  1990s   one ovary removed as well    Social History   Tobacco Use   Smoking status: Former    Packs/day: 0.50    Years: 50.00    Additional pack years: 0.00    Total pack years: 25.00    Types: Cigarettes, E-cigarettes    Quit date: 11/22/2011    Years since quitting: 10.6   Smokeless tobacco: Never   Tobacco comments:    quit 2014  Vaping Use   Vaping Use: Never used  Substance Use Topics   Alcohol use: Yes    Alcohol/week: 2.0 standard drinks of alcohol    Types: 2 Glasses of wine per week   Drug use: No     Medication list has been reviewed and updated.  No outpatient medications have been marked as taking for the 07/02/22 encounter (Office Visit) with Duanne Limerick, MD.       07/02/2022   10:19 AM 01/29/2022    9:14 AM 12/28/2021   11:07 AM 11/08/2021   11:34 AM  GAD 7 : Generalized Anxiety Score  Nervous, Anxious, on Edge 0 0 0 0  Control/stop worrying 0 0 0 0  Worry too much - different things 0 0 0 0  Trouble relaxing 0 0 0 0  Restless 0 0 0 0  Easily annoyed or irritable 0 0 0 0  Afraid - awful might happen 0 0 0 0  Total GAD 7 Score 0 0 0 0  Anxiety Difficulty Not difficult at all Not difficult at all Not difficult at all Not difficult at all       07/02/2022   10:18 AM 01/29/2022    9:15 AM 01/29/2022    9:14 AM  Depression screen PHQ 2/9  Decreased Interest 0 0 0  Down, Depressed, Hopeless 0 0 0  PHQ - 2 Score 0 0 0  Altered sleeping 0 0 0  Tired, decreased energy 0 0 0  Change in appetite 0 0 0  Feeling bad or failure about yourself  0 0 0  Trouble concentrating 0 0 0  Moving slowly or fidgety/restless 0 0 0  Suicidal thoughts 0 0 0  PHQ-9 Score 0 0 0  Difficult doing work/chores Not difficult at all Not difficult at all Not difficult at all    BP Readings from Last 3 Encounters:  07/02/22 120/74  03/26/22 (!) 159/82  01/29/22 120/70    Physical Exam Vitals and nursing note reviewed. Exam  conducted with a chaperone present.  Constitutional:      General: She is not in acute distress.    Appearance: She is not diaphoretic.  HENT:     Head: Normocephalic and atraumatic.     Right Ear: Tympanic membrane and external ear normal.     Left Ear: Tympanic membrane and external ear normal.     Nose: Nose normal.  Eyes:     General:        Right eye: No discharge.  Left eye: No discharge.     Conjunctiva/sclera: Conjunctivae normal.     Pupils: Pupils are equal, round, and reactive to light.  Neck:     Thyroid: No thyromegaly.     Vascular: No JVD.  Cardiovascular:     Rate and Rhythm: Normal rate and regular rhythm.     Heart sounds: Normal heart sounds. No murmur heard.    No friction rub. No gallop.  Pulmonary:     Effort: Pulmonary effort is normal.     Breath sounds: Normal breath sounds.  Abdominal:     General: Bowel sounds are normal.     Palpations: Abdomen is soft. There is no mass.     Tenderness: There is no abdominal tenderness. There is no guarding.  Musculoskeletal:        General: Normal range of motion.     Cervical back: Normal range of motion and neck supple.     Left upper leg: Tenderness present. No edema, deformity or bony tenderness.     Comments: Tender along iliotibial band/and trochanteric bursa  Lymphadenopathy:     Cervical: No cervical adenopathy.  Skin:    General: Skin is warm and dry.  Neurological:     Mental Status: She is alert.     Deep Tendon Reflexes: Reflexes are normal and symmetric.     Wt Readings from Last 3 Encounters:  07/02/22 150 lb (68 kg)  01/29/22 144 lb (65.3 kg)  12/28/21 146 lb (66.2 kg)    BP 120/74   Pulse 66   Ht 5\' 1"  (1.549 m)   Wt 150 lb (68 kg)   SpO2 96%   BMI 28.34 kg/m   Assessment and Plan: 1. Acute pain of left thigh Acute.  Persistent.  Pain is preventing patient and assuming a position of comfort.  Tenderness along the lateral aspect of left thigh as I walk up the iliotibial band  from just above the knee to insertion on the iliac crest.  There is also tenderness directly on the trochanteric bursa as well we will get an x-ray of the lumbar area as well as the hip and likely will refer to sports medicine for evaluation and possible consideration for therapeutics such as injections.  I have placed patient on prednisone taper because she is allergic to NSAIDs which causes her hives and is reluctant to take Aleve or Advil.  I have suggested that patient uses an ice pack over the area in the meantime as well - DG Hip Unilat W OR W/O Pelvis 2-3 Views Left - DG Lumbar Spine Complete     Elizabeth Sauer, MD

## 2022-07-02 NOTE — Patient Instructions (Signed)
Hip Bursitis  Hip bursitis is the swelling of one or more of the fluid-filled sacs (bursae) in the hip joint. The hip bursae absorb shocks and prevent bones from rubbing against each other. If a bursa becomes irritated, it can fill with extra fluid and become inflamed. Hip bursitis can cause mild to moderate pain, and symptoms often come and go over time. What are the causes? This condition results from increased friction between the hip bones and the tendons around the hip joint. This condition can happen if you: Overuse your hip muscles. Injure your hip. Have weak buttocks muscles. Have bone spurs. Have an infection. In some cases, the cause may not be known. What increases the risk? You are more likely to develop this condition if: You injured your hip previously or had hip surgery. You have a medical condition, such as arthritis, gout, diabetes, or thyroid disease. You have spine problems. You have one leg that is shorter than the other. You participate in athletic activities that include repetitive motion, like running. You participate in sports where there is a risk of injury or falling, such as football, martial arts, or skiing. What are the signs or symptoms? Symptoms may come and go, and they often include: Pain in the hip or groin area. Pain may get worse with movement. Tenderness and swelling of the hip. In rare cases, the bursa may become infected. If this happens, you may get a fever, as well as warmth and redness in the hip area. How is this diagnosed? This condition may be diagnosed based on: Your symptoms. Your medical history. A physical exam. Imaging tests, such as: X-rays to check your bones. MRI or ultrasound to check your tendons and muscles. Bone scan. How is this treated? This condition is treated by resting, icing, applying pressure (compression), and raising (elevating) the injured area. This is called RICE treatment. In some cases, RICE treatment may not  be enough to make your symptoms go away. Treatment may also include: Using crutches, a cane, or a walker to decrease the strain on your hip. Taking medicine to help with swelling and pain. Getting a shot of cortisone medicine near the affected area to reduce swelling and pain. Taking antibiotic medicines if there is an infection. Draining fluid out of the bursa to help relieve swelling and pain. Having surgery to remove a damaged or infected bursa. This is rare. Long-term treatment may include: Physical therapy exercises for strength and flexibility. Identifying the cause of your bursitis to prevent future episodes. Lifestyle changes, such as weight loss, to reduce the strain on the hip. Follow these instructions at home: Managing pain, stiffness, and swelling     If directed, put ice on the affected area. To do this: Put ice in a plastic bag. Place a towel between your skin and the bag. Leave the ice on for 20 minutes, 2-3 times a day. Remove the ice if your skin turns bright red. This is very important. If you cannot feel pain, heat, or cold, you have a greater risk of damage to the area. Elevate your hip as much as you can without feeling pain. To do this, put a pillow under your hips while you lie down. If directed, apply heat to the affected area as often as told by your health care provider. Use the heat source that your health care provider recommends, such as a moist heat pack or a heating pad. Place a towel between your skin and the heat source. Leave the heat   on for 20-30 minutes. Remove the heat if your skin turns bright red. This is especially important if you are unable to feel pain, heat, or cold. You may have a greater risk of getting burned. Activity Do not use your hip to support your body weight until your health care provider says that you can. Use crutches, a cane, or a walker as told by your health care provider. If the affected leg is one that you use to drive, ask  your health care provider if it is safe to drive. Rest and protect your hip as much as possible until your pain and swelling get better. Return to your normal activities as told by your health care provider. Ask your health care provider what activities are safe for you. Do exercises as told by your health care provider. General instructions Take over-the-counter and prescription medicines only as told by your health care provider. Gently massage and stretch your injured area as often as is comfortable. Wear compression wraps only as told by your health care provider. If one of your legs is shorter than the other, get fitted for a shoe insert or orthotic. Your health care provider or physical therapist can tell you where to find these items and what size you need. Maintain a healthy weight. Follow instructions from your health care provider for weight control. These may include dietary restrictions. Keep all follow-up visits. This is important. How is this prevented? Exercise regularly or as told by your health care provider. Wear supportive footwear that is appropriate for your sport and daily activities. Warm up and stretch before being active. Cool down and stretch after being active. Take breaks regularly from repetitive activity. If an activity irritates your hip or causes pain, avoid the activity as much as possible. Avoid sitting down for long periods at a time. Where to find more information American Academy [employer]: orthoinfo.aaos.org Contact a health care provider if: You have a fever. You develop new symptoms. You have trouble walking or doing everyday activities. You have pain that gets worse or does not get better with medicine. You develop red skin or a feeling of warmth in your hip area. Get help right away if: You cannot move your hip. You have severe pain. You cannot control the muscles in your feet. Summary Hip bursitis is the swelling of one or more  of the fluid-filled sacs (bursae) in the hip joint. Hip bursitis can cause hip or groin pain, and symptoms often come and go over time. This condition is often treated by resting, icing, applying pressure (compression), and raising (elevating) the injured area. Other treatments may be needed. This information is not intended to replace advice given to you by your health care provider. Make sure you discuss any questions you have with your health care provider. Document Revised: 01/02/2021 Document Reviewed: 01/02/2021 Elsevier Patient Education  2024 [employer] Iliotibial Band Syndrome  Iliotibial band syndrome is a condition that often causes knee pain. It can also cause pain in the outside of the hip, thigh, and knee. The iliotibial band is a strip of tissue in each of the legs. This band runs from the outside of the hip and down the thigh to the outside of the knee. Repeatedly bending and straightening your knee can irritate your iliotibial band. What are the causes? This condition is caused by inflammation from rubbing (friction) of the iliotibial band as it moves over the thigh bone (femur) when you bend and straighten your knee again  and again. What increases the risk? You are more likely to develop this condition if: You change elevation often while running on a treadmill. You run very long distances. You recently increased the length or intensity of your workouts. Intensity means how much effort you put in. You run downhill often, or you just started running downhill. You ride a bike very far or often. You may also be at greater risk if: You start a new workout routine without first warming up your muscles. You have a job that requires you to bend, squat, or climb often. What are the signs or symptoms? Symptoms of this condition include: Pain along the outside of your knee that may be worse with activity, especially running or going up and down stairs. A feeling like a snap over  your knee or hip. Swelling on the outside of your knee. Pain or a feeling of tightness in your hip. How is this diagnosed? This condition is diagnosed based on: Your symptoms. Your medical history. A physical exam. You may also see a health care provider who specializes in reducing pain and improving movement (physical therapist). A physical therapist may do an exam to check your balance, movement, and way of walking or running (gait) to see whether the way you move could add to your injury. You may also have tests to measure your strength, flexibility, and range of motion. How is this treated? This condition may be treated by: Taking NSAIDs, such as ibuprofen, to help relieve pain and swelling. Resting and limiting exercise. Returning to activities gradually. Doing exercises to improve movement and strength (physical therapy) as told by your health care provider. Having an injection of steroid medicine. This is medicine that helps relieve inflammation. Having surgery. This may be done if your symptoms do not improve after other treatments. Follow these instructions at home: Managing pain, stiffness, and swelling If directed, put ice on the injured area. To do this: Put ice in a plastic bag. Place a towel between your skin and the bag. Leave the ice on for 20 minutes, 2-3 times a day. Remove the ice if your skin turns bright red. This is very important. If you cannot feel pain, heat, or cold, you have a greater risk of damage to the area.  Activity Return to your normal activities as told by your health care provider. Ask your health care provider what activities are safe for you. Include low-impact activities, such as swimming, in your exercise routine. Check with your health care provider to make sure running is safe for you. Do exercises as told by your health care provider. General instructions Take over-the-counter and prescription medicines only as told by your health care  provider. Make sure you wear shoes that fit well and have good cushioning and arch support. Keep all follow-up visits. This is important. How is this prevented? Warm up and stretch before being active. Cool down and stretch after being active. Give your body time to rest between periods of activity. Consider getting help from a coach or trainer to come up with a safe running plan and a plan to advance (progress) your training that fits your goals and ability. Change directions often while running around a track. Replace your shoes when the soles are worn out. Maintain physical fitness. This includes strength and flexibility. Contact a health care provider if: Your pain does not improve. Your pain gets worse even with treatment. Summary Iliotibial band syndrome is a condition that often causes knee pain. It can  also cause pain in the outside of your hip, thigh, and knee. Treatment includes taking NSAIDs, resting, returning to activities gradually, and doing physical therapy exercises. Return to your normal activities as told by your health care provider. Ask your health care provider what activities are safe for you. This information is not intended to replace advice given to you by your health care provider. Make sure you discuss any questions you have with your health care provider. Document Revised: 05/10/2019 Document Reviewed: 05/10/2019 Elsevier Patient Education  2024 [employer].

## 2022-07-04 ENCOUNTER — Encounter: Payer: Self-pay | Admitting: Family Medicine

## 2022-07-16 ENCOUNTER — Other Ambulatory Visit: Payer: Self-pay

## 2022-07-16 DIAGNOSIS — M79652 Pain in left thigh: Secondary | ICD-10-CM

## 2022-07-17 ENCOUNTER — Ambulatory Visit (INDEPENDENT_AMBULATORY_CARE_PROVIDER_SITE_OTHER): Payer: Medicare HMO

## 2022-07-17 VITALS — Ht 61.0 in | Wt 150.0 lb

## 2022-07-17 DIAGNOSIS — Z0000 Encounter for general adult medical examination without abnormal findings: Secondary | ICD-10-CM | POA: Diagnosis not present

## 2022-07-17 DIAGNOSIS — M79652 Pain in left thigh: Secondary | ICD-10-CM

## 2022-07-17 NOTE — Patient Instructions (Signed)
Emily Boyer , Thank you for taking time to come for your Medicare Wellness Visit. I appreciate your ongoing commitment to your health goals. Please review the following plan we discussed and let me know if I can assist you in the future.   These are the goals we discussed:  Goals       Exercise 3x per week (30 min per time) (pt-stated)      Exercise to keep in good health      Increase physical activity      Recommend physical activity of 150 minutes per day         This is a list of the screening recommended for you and due dates:  Health Maintenance  Topic Date Due   COVID-19 Vaccine (6 - 2023-24 season) 07/18/2022*   Zoster (Shingles) Vaccine (1 of 2) 10/02/2022*   Hepatitis C Screening  07/02/2023*   Flu Shot  08/22/2022   Screening for Lung Cancer  02/15/2023   Medicare Annual Wellness Visit  07/17/2023   Colon Cancer Screening  02/05/2025   DTaP/Tdap/Td vaccine (2 - Td or Tdap) 12/06/2025   Pneumonia Vaccine  Completed   DEXA scan (bone density measurement)  Completed   HPV Vaccine  Aged Out  *Topic was postponed. The date shown is not the original due date.   Health Maintenance After Age 61 After age 56, you are at a higher risk for certain long-term diseases and infections as well as injuries from falls. Falls are a major cause of broken bones and head injuries in people who are older than age 76. Getting regular preventive care can help to keep you healthy and well. Preventive care includes getting regular testing and making lifestyle changes as recommended by your health care provider. Talk with your health care provider about: Which screenings and tests you should have. A screening is a test that checks for a disease when you have no symptoms. A diet and exercise plan that is right for you. What should I know about screenings and tests to prevent falls? Screening and testing are the best ways to find a health problem early. Early diagnosis and treatment give you the  best chance of managing medical conditions that are common after age 48. Certain conditions and lifestyle choices may make you more likely to have a fall. Your health care provider may recommend: Regular vision checks. Poor vision and conditions such as cataracts can make you more likely to have a fall. If you wear glasses, make sure to get your prescription updated if your vision changes. Medicine review. Work with your health care provider to regularly review all of the medicines you are taking, including over-the-counter medicines. Ask your health care provider about any side effects that may make you more likely to have a fall. Tell your health care provider if any medicines that you take make you feel dizzy or sleepy. Strength and balance checks. Your health care provider may recommend certain tests to check your strength and balance while standing, walking, or changing positions. Foot health exam. Foot pain and numbness, as well as not wearing proper footwear, can make you more likely to have a fall. Screenings, including: Osteoporosis screening. Osteoporosis is a condition that causes the bones to get weaker and break more easily. Blood pressure screening. Blood pressure changes and medicines to control blood pressure can make you feel dizzy. Depression screening. You may be more likely to have a fall if you have a fear of falling, feel depressed,  or feel unable to do activities that you used to do. Alcohol use screening. Using too much alcohol can affect your balance and may make you more likely to have a fall. Follow these instructions at home: Lifestyle Do not drink alcohol if: Your health care provider tells you not to drink. If you drink alcohol: Limit how much you have to: 0-1 drink a day for women. 0-2 drinks a day for men. Know how much alcohol is in your drink. In the [place] one drink equals one 12 oz bottle of beer (355 mL), one 5 oz glass of wine (148 mL), or one 1 oz glass of  hard liquor (44 mL). Do not use any products that contain nicotine or tobacco. These products include cigarettes, chewing tobacco, and vaping devices, such as e-cigarettes. If you need help quitting, ask your health care provider. Activity  Follow a regular exercise program to stay fit. This will help you maintain your balance. Ask your health care provider what types of exercise are appropriate for you. If you need a cane or walker, use it as recommended by your health care provider. Wear supportive shoes that have nonskid soles. Safety  Remove any tripping hazards, such as rugs, cords, and clutter. Install safety equipment such as grab bars in bathrooms and safety rails on stairs. Keep rooms and walkways well-lit. General instructions Talk with your health care provider about your risks for falling. Tell your health care provider if: You fall. Be sure to tell your health care provider about all falls, even ones that seem minor. You feel dizzy, tiredness (fatigue), or off-balance. Take over-the-counter and prescription medicines only as told by your health care provider. These include supplements. Eat a healthy diet and maintain a healthy weight. A healthy diet includes low-fat dairy products, low-fat (lean) meats, and fiber from whole grains, beans, and lots of fruits and vegetables. Stay current with your vaccines. Schedule regular health, dental, and eye exams. Summary Having a healthy lifestyle and getting preventive care can help to protect your health and wellness after age 34. Screening and testing are the best way to find a health problem early and help you avoid having a fall. Early diagnosis and treatment give you the best chance for managing medical conditions that are more common for people who are older than age 24. Falls are a major cause of broken bones and head injuries in people who are older than age 23. Take precautions to prevent a fall at home. Work with your health care  provider to learn what changes you can make to improve your health and wellness and to prevent falls. This information is not intended to replace advice given to you by your health care provider. Make sure you discuss any questions you have with your health care provider. Document Revised: 05/29/2020 Document Reviewed: 05/29/2020 Elsevier Patient Education  2024 [employer].

## 2022-07-17 NOTE — Progress Notes (Signed)
Subjective:   Emily Boyer is a 77 y.o. female who presents for Medicare Annual (Subsequent) preventive examination.  Visit Complete: Virtual  I connected with  Cyril Loosen on 07/17/22 by a audio enabled telemedicine application and verified that I am speaking with the correct person using two identifiers.  Patient Location: Home  Provider Location: Office/Clinic  I discussed the limitations of evaluation and management by telemedicine. The patient expressed understanding and agreed to proceed.     Cardiac Risk Factors include: advanced age (>54men, >71 women);dyslipidemia;hypertension     Objective:    Today's Vitals   07/17/22 0848 07/17/22 0850  Weight: 150 lb (68 kg)   Height: 5\' 1"  (1.549 m)   PainSc:  0-No pain   Body mass index is 28.34 kg/m.     07/17/2022    9:00 AM 10/30/2021    8:00 AM 10/29/2021    4:01 PM 02/12/2021    4:01 PM 04/27/2020    8:43 AM 02/09/2020   11:36 AM 02/08/2019   10:58 AM  Advanced Directives  Does Patient Have a Medical Advance Directive? No No No No No No No  Would patient like information on creating a medical advance directive? No - Patient declined No - Patient declined  Yes (MAU/Ambulatory/Procedural Areas - Information given)  No - Patient declined Yes (MAU/Ambulatory/Procedural Areas - Information given)    Current Medications (verified) Outpatient Encounter Medications as of 07/17/2022  Medication Sig   acetaminophen (TYLENOL) 500 MG tablet Take 500 mg by mouth every 6 (six) hours as needed.   albuterol (VENTOLIN HFA) 108 (90 Base) MCG/ACT inhaler Inhale 2 puffs into the lungs every 6 (six) hours as needed for wheezing.   aspirin EC 81 MG tablet Take by mouth.   atorvastatin (LIPITOR) 10 MG tablet Take 1 tablet (10 mg total) by mouth daily.   budesonide-formoterol (SYMBICORT) 160-4.5 MCG/ACT inhaler Inhale 2 puffs into the lungs 2 (two) times daily.   cholecalciferol (VITAMIN D) 1000 units tablet Take 1,000 Units by  mouth daily.   losartan-hydrochlorothiazide (HYZAAR) 50-12.5 MG tablet Take 1 tablet by mouth daily.   pantoprazole (PROTONIX) 40 MG tablet Take 1 tablet (40 mg total) by mouth daily.   Zinc Oxide-Vitamin C (ZINC PLUS VITAMIN C PO) Take 1 tablet by mouth daily.   [DISCONTINUED] predniSONE (DELTASONE) 10 MG tablet Take 1 tablet (10 mg total) by mouth daily with breakfast. Taper 4,4,4,3,3,3,2,2,2,1,1,1   No facility-administered encounter medications on file as of 07/17/2022.    Allergies (verified) Penicillins, Guaifenesin & derivatives, Methocarbamol, and Tramadol hcl   History: Past Medical History:  Diagnosis Date   Allergy Sinus   Bone spur    right side of neck   Colitis    DDD (degenerative disc disease), cervical    Diverticulitis    GERD (gastroesophageal reflux disease)    Heart murmur    followed by PCP   HLD (hyperlipidemia)    Hypertension    Seasonal allergies    Past Surgical History:  Procedure Laterality Date   BREAST BIOPSY Right 2004   neg   CATARACT EXTRACTION W/PHACO Right 04/29/2017   Procedure: CATARACT EXTRACTION PHACO AND INTRAOCULAR LENS PLACEMENT (IOC) RIGHT;  Surgeon: Nevada Crane, MD;  Location: [facility];  Service: Ophthalmology;  Laterality: Right;   CATARACT EXTRACTION W/PHACO Left 07/15/2017   Procedure: CATARACT EXTRACTION PHACO AND INTRAOCULAR LENS PLACEMENT (IOC) LEFT;  Surgeon: Nevada Crane, MD;  Location: [facility];  Service: Ophthalmology;  Laterality:  Left;   COLONOSCOPY WITH PROPOFOL N/A 02/06/2015   Procedure: COLONOSCOPY WITH PROPOFOL;  Surgeon: Midge Minium, MD;  Location: [facility];  Service: Endoscopy;  Laterality: N/A;   EYE SURGERY  2019   TUBAL LIGATION  1980s   VAGINAL HYSTERECTOMY  1990s   one ovary removed as well   Family History  Problem Relation Age of Onset   Stroke Mother    Hypertension Mother    Arthritis Father    Heart disease Paternal Aunt    Cancer Paternal Grandmother     Stomach cancer Paternal Grandmother    Breast cancer Cousin        pat cousin   Social History   Socioeconomic History   Marital status: Divorced    Spouse name: Not on file   Number of children: 2   Years of education: Not on file   Highest education level: Not on file  Occupational History   Not on file  Tobacco Use   Smoking status: Former    Packs/day: 0.50    Years: 50.00    Additional pack years: 0.00    Total pack years: 25.00    Types: Cigarettes, E-cigarettes    Quit date: 11/22/2011    Years since quitting: 10.6   Smokeless tobacco: Never   Tobacco comments:    quit 2014  Vaping Use   Vaping Use: Never used  Substance and Sexual Activity   Alcohol use: Yes    Alcohol/week: 2.0 standard drinks of alcohol    Types: 2 Glasses of wine per week   Drug use: No   Sexual activity: Yes    Birth control/protection: None  Other Topics Concern   Not on file  Social History Narrative   Not on file   Social Determinants of Health   Financial Resource Strain: Low Risk  (07/17/2022)   Overall Financial Resource Strain (CARDIA)    Difficulty of Paying Living Expenses: Not hard at all  Food Insecurity: No Food Insecurity (07/17/2022)   Hunger Vital Sign    Worried About Running Out of Food in the Last Year: Never true    Ran Out of Food in the Last Year: Never true  Transportation Needs: No Transportation Needs (07/17/2022)   PRAPARE - Administrator, Civil Service (Medical): No    Lack of Transportation (Non-Medical): No  Physical Activity: Insufficiently Active (07/17/2022)   Exercise Vital Sign    Days of Exercise per Week: 2 days    Minutes of Exercise per Session: 30 min  Stress: No Stress Concern Present (07/17/2022)   [employer] of Occupational Health - Occupational Stress Questionnaire    Feeling of Stress : Not at all  Social Connections: Socially Isolated (07/17/2022)   Social Connection and Isolation Panel [NHANES]    Frequency of  Communication with Friends and Family: More than three times a week    Frequency of Social Gatherings with Friends and Family: More than three times a week    Attends Religious Services: Never    Database administrator or Organizations: No    Attends Engineer, structural: Never    Marital Status: Divorced    Tobacco Counseling Counseling given: Yes Tobacco comments: quit 2014   Clinical Intake:  Pre-visit preparation completed: No  Pain : No/denies pain Pain Score: 0-No pain     BMI - recorded: 28.34 Nutritional Status: BMI 25 -29 Overweight Nutritional Risks: None Diabetes: No  How often do you need to  have someone help you when you read instructions, pamphlets, or other written materials from your doctor or pharmacy?: 1 - Never  Interpreter Needed?: No      Activities of Daily Living    07/17/2022    8:52 AM 10/30/2021    8:00 AM  In your present state of health, do you have any difficulty performing the following activities:  Hearing? 0 0  Vision? 0 0  Difficulty concentrating or making decisions? 0 0  Walking or climbing stairs? 0 0  Dressing or bathing? 0 0  Doing errands, shopping? 0 0  Preparing Food and eating ? N   Using the Toilet? N   In the past six months, have you accidently leaked urine? N   Do you have problems with loss of bowel control? N   Managing your Medications? N   Managing your Finances? N   Housekeeping or managing your Housekeeping? N     Patient Care Team: Duanne Limerick, MD as PCP - General (Family Medicine)  Indicate any recent Medical Services you may have received from other than [facility] providers in the past year (date may be approximate).     Assessment:   This is a routine wellness examination for [place].  Hearing/Vision screen Hearing Screening - Comments:: Televisit- pt not hard of hearing  Dietary issues and exercise activities discussed:     Goals Addressed               This Visit's Progress      Exercise 3x per week (30 min per time) (pt-stated)        Exercise to keep in good health       Depression Screen    07/17/2022    9:02 AM 07/17/2022    8:58 AM 07/02/2022   10:18 AM 01/29/2022    9:15 AM 01/29/2022    9:14 AM 12/28/2021   11:07 AM 11/08/2021   11:34 AM  PHQ 2/9 Scores  PHQ - 2 Score 0 0 0 0 0 0 0  PHQ- 9 Score 0 0 0 0 0 0 0    Fall Risk    07/17/2022    9:01 AM 07/02/2022   10:18 AM 01/29/2022    9:14 AM 12/28/2021   11:07 AM 11/08/2021   11:34 AM  Fall Risk   Falls in the past year? 0 0 0 0 0  Number falls in past yr: 0 0 0 0 0  Injury with Fall? 0 0 0 0 0  Risk for fall due to : No Fall Risks No Fall Risks No Fall Risks No Fall Risks No Fall Risks  Follow up Falls evaluation completed Falls evaluation completed Falls evaluation completed Falls evaluation completed Falls evaluation completed    MEDICARE RISK AT HOME:  Medicare Risk at Home - 07/17/22 0901     Any stairs in or around the home? No    If so, are there any without handrails? No    Home free of loose throw rugs in walkways, pet beds, electrical cords, etc? Yes    Adequate lighting in your home to reduce risk of falls? Yes    Life alert? No    Use of a cane, walker or w/c? No    Grab bars in the bathroom? No   advised to get   Shower chair or bench in shower? No    Elevated toilet seat or a handicapped toilet? No  Cognitive Function:        07/17/2022    9:02 AM 02/08/2019   11:02 AM  6CIT Screen  What Year? 0 points 0 points  What month? 0 points 0 points  What time? 3 points 0 points  Count back from 20 0 points 0 points  Months in reverse 0 points 0 points  Repeat phrase 0 points 2 points  Total Score 3 points 2 points    Immunizations Immunization History  Administered Date(s) Administered   Fluad Quad(high Dose 65+) 10/12/2018, 11/03/2019   Influenza, High Dose Seasonal PF 11/18/2016, 11/25/2017   Influenza,inj,Quad PF,6+ Mos 12/07/2015    Influenza-Unspecified 01/10/2021   Moderna Sars-Covid-2 Vaccination 02/26/2019, 03/26/2019, 11/27/2019, 07/10/2020, 12/16/2020   PPD Test 01/17/2016   Pneumococcal Conjugate-13 12/13/2015   Pneumococcal Polysaccharide-23 07/05/2014   Tdap 12/07/2015    TDAP status: Up to date  Flu Vaccine status: Up to date  Pneumococcal vaccine status: Due, Education has been provided regarding the importance of this vaccine. Advised may receive this vaccine at local pharmacy or Health Dept. Aware to provide a copy of the vaccination record if obtained from local pharmacy or Health Dept. Verbalized acceptance and understanding.  Covid-19 vaccine status: Completed vaccines  Qualifies for Shingles Vaccine? Yes   Zostavax completed No   Shingrix Completed?: Yes  Screening Tests Health Maintenance  Topic Date Due   COVID-19 Vaccine (6 - 2023-24 season) 07/18/2022 (Originally 09/21/2021)   Zoster Vaccines- Shingrix (1 of 2) 10/02/2022 (Originally 10/21/1996)   Hepatitis C Screening  07/02/2023 (Originally 10/21/1963)   INFLUENZA VACCINE  08/22/2022   Lung Cancer Screening  02/15/2023   Medicare Annual Wellness (AWV)  07/17/2023   Colonoscopy  02/05/2025   DTaP/Tdap/Td (2 - Td or Tdap) 12/06/2025   Pneumonia Vaccine 15+ Years old  Completed   DEXA SCAN  Completed   HPV VACCINES  Aged Out    Health Maintenance  There are no preventive care reminders to display for this patient.  Colorectal cancer screening: Type of screening: Colonoscopy. Completed 02/06/2015. Repeat every 10 years  Mammogram status: Completed 05/14/2022. Repeat every year  Bone Density status: Completed 09/25/17. Results reflect: Bone density results: OSTEOPENIA. Repeat every 2 years.  Lung Cancer Screening: (Low Dose CT Chest recommended if Age 12-80 years, 20 pack-year currently smoking OR have quit w/in 15years.) does not qualify.     Additional Screening:  Hepatitis C Screening: does qualify; Completed pt does not wish  to have  Vision Screening: Recommended annual ophthalmology exams for early detection of glaucoma and other disorders of the eye. Is the patient up to date with their annual eye exam?  Yes  Who is the provider or what is the name of the office in which the patient attends annual eye exams? Dr Brooke Dare   Dental Screening: Recommended annual dental exams for proper oral hygiene         Plan:     I have personally reviewed and noted the following in the patient's chart:   Medical and social history Use of alcohol, tobacco or illicit drugs  Current medications and supplements including opioid prescriptions.  Functional ability and status Nutritional status Physical activity Advanced directives List of other physicians Hospitalizations, surgeries, and ER visits in previous 12 months Vitals Screenings to include cognitive, depression, and falls Referrals and appointments  In addition, I have reviewed and discussed with patient certain preventive protocols, quality metrics, and best practice recommendations. A written personalized care plan for preventive services as well as  general preventive health recommendations were provided to patient.     Everitt Amber   07/17/2022   After Visit Summary: (MyChart) Due to this being a telephonic visit, the after visit summary with patients personalized plan was offered to patient via MyChart   Nurse Notes: no concerns

## 2022-07-31 DIAGNOSIS — M5442 Lumbago with sciatica, left side: Secondary | ICD-10-CM | POA: Diagnosis not present

## 2022-07-31 DIAGNOSIS — M47816 Spondylosis without myelopathy or radiculopathy, lumbar region: Secondary | ICD-10-CM | POA: Diagnosis not present

## 2022-07-31 DIAGNOSIS — G8929 Other chronic pain: Secondary | ICD-10-CM | POA: Diagnosis not present

## 2022-07-31 DIAGNOSIS — M7062 Trochanteric bursitis, left hip: Secondary | ICD-10-CM | POA: Diagnosis not present

## 2022-08-07 DIAGNOSIS — G629 Polyneuropathy, unspecified: Secondary | ICD-10-CM | POA: Diagnosis not present

## 2022-08-07 DIAGNOSIS — R011 Cardiac murmur, unspecified: Secondary | ICD-10-CM | POA: Diagnosis not present

## 2022-08-07 DIAGNOSIS — M1990 Unspecified osteoarthritis, unspecified site: Secondary | ICD-10-CM | POA: Diagnosis not present

## 2022-08-07 DIAGNOSIS — N189 Chronic kidney disease, unspecified: Secondary | ICD-10-CM | POA: Diagnosis not present

## 2022-08-07 DIAGNOSIS — M545 Low back pain, unspecified: Secondary | ICD-10-CM | POA: Diagnosis not present

## 2022-08-07 DIAGNOSIS — K219 Gastro-esophageal reflux disease without esophagitis: Secondary | ICD-10-CM | POA: Diagnosis not present

## 2022-08-07 DIAGNOSIS — E785 Hyperlipidemia, unspecified: Secondary | ICD-10-CM | POA: Diagnosis not present

## 2022-08-07 DIAGNOSIS — E034 Atrophy of thyroid (acquired): Secondary | ICD-10-CM | POA: Diagnosis not present

## 2022-08-07 DIAGNOSIS — M4800 Spinal stenosis, site unspecified: Secondary | ICD-10-CM | POA: Diagnosis not present

## 2022-08-07 DIAGNOSIS — J301 Allergic rhinitis due to pollen: Secondary | ICD-10-CM | POA: Diagnosis not present

## 2022-08-15 DIAGNOSIS — M5442 Lumbago with sciatica, left side: Secondary | ICD-10-CM | POA: Diagnosis not present

## 2022-08-15 DIAGNOSIS — M6281 Muscle weakness (generalized): Secondary | ICD-10-CM | POA: Diagnosis not present

## 2022-08-15 DIAGNOSIS — M47816 Spondylosis without myelopathy or radiculopathy, lumbar region: Secondary | ICD-10-CM | POA: Diagnosis not present

## 2022-08-15 DIAGNOSIS — G8929 Other chronic pain: Secondary | ICD-10-CM | POA: Diagnosis not present

## 2022-08-15 DIAGNOSIS — M7062 Trochanteric bursitis, left hip: Secondary | ICD-10-CM | POA: Diagnosis not present

## 2022-08-21 DIAGNOSIS — G8929 Other chronic pain: Secondary | ICD-10-CM | POA: Diagnosis not present

## 2022-08-21 DIAGNOSIS — M6281 Muscle weakness (generalized): Secondary | ICD-10-CM | POA: Diagnosis not present

## 2022-08-21 DIAGNOSIS — M7062 Trochanteric bursitis, left hip: Secondary | ICD-10-CM | POA: Diagnosis not present

## 2022-08-21 DIAGNOSIS — M47816 Spondylosis without myelopathy or radiculopathy, lumbar region: Secondary | ICD-10-CM | POA: Diagnosis not present

## 2022-08-21 DIAGNOSIS — M5442 Lumbago with sciatica, left side: Secondary | ICD-10-CM | POA: Diagnosis not present

## 2022-08-27 DIAGNOSIS — M47816 Spondylosis without myelopathy or radiculopathy, lumbar region: Secondary | ICD-10-CM | POA: Diagnosis not present

## 2022-08-27 DIAGNOSIS — G8929 Other chronic pain: Secondary | ICD-10-CM | POA: Diagnosis not present

## 2022-08-27 DIAGNOSIS — M6281 Muscle weakness (generalized): Secondary | ICD-10-CM | POA: Diagnosis not present

## 2022-08-27 DIAGNOSIS — M5442 Lumbago with sciatica, left side: Secondary | ICD-10-CM | POA: Diagnosis not present

## 2022-08-27 DIAGNOSIS — M7062 Trochanteric bursitis, left hip: Secondary | ICD-10-CM | POA: Diagnosis not present

## 2022-09-03 DIAGNOSIS — M5442 Lumbago with sciatica, left side: Secondary | ICD-10-CM | POA: Diagnosis not present

## 2022-09-03 DIAGNOSIS — G8929 Other chronic pain: Secondary | ICD-10-CM | POA: Diagnosis not present

## 2022-10-16 ENCOUNTER — Other Ambulatory Visit: Payer: Self-pay | Admitting: Family Medicine

## 2022-10-16 DIAGNOSIS — E782 Mixed hyperlipidemia: Secondary | ICD-10-CM

## 2022-10-16 DIAGNOSIS — I10 Essential (primary) hypertension: Secondary | ICD-10-CM

## 2022-10-16 NOTE — Telephone Encounter (Signed)
Requested medication (s) are due for refill today: yes  Requested medication (s) are on the active medication list: yes    Last refill: 01/29/22 #90  1 refill  Future visit scheduled no  Notes to clinic:Failed due to labs, please review. Thank you.   Requested Prescriptions  Pending Prescriptions Disp Refills   atorvastatin (LIPITOR) 10 MG tablet [Pharmacy Med Name: Atorvastatin Calcium 10 MG Oral Tablet] 90 tablet 0    Sig: Take 1 tablet by mouth once daily     Cardiovascular:  Antilipid - Statins Failed - 10/16/2022  6:50 AM      Failed - Lipid Panel in normal range within the last 12 months    Cholesterol, Total  Date Value Ref Range Status  08/01/2021 239 (H) 100 - 199 mg/dL Final   LDL Chol Calc (NIH)  Date Value Ref Range Status  08/01/2021 158 (H) 0 - 99 mg/dL Final   HDL  Date Value Ref Range Status  08/01/2021 40 >39 mg/dL Final   Triglycerides  Date Value Ref Range Status  08/01/2021 221 (H) 0 - 149 mg/dL Final         Passed - Patient is not pregnant      Passed - Valid encounter within last 12 months    Recent Outpatient Visits           3 months ago Acute pain of left thigh   [place] [facility]   8 months ago Shortness of breath   [place] [facility]   9 months ago RSV (acute bronchiolitis due to respiratory syncytial virus)   [place] [facility]   9 months ago RSV (acute bronchiolitis due to respiratory syncytial virus)   [place] [facility] at [facility], Ocie Bob, MD   10 months ago Acute pansinusitis, recurrence not specified    [facility] at [facility], Ocie Bob, MD              Signed Prescriptions Disp Refills   losartan-hydrochlorothiazide (HYZAAR) 50-12.5 MG tablet 90  tablet 0    Sig: Take 1 tablet by mouth once daily     Cardiovascular: ARB + Diuretic Combos Failed - 10/16/2022  6:50 AM      Failed - K in normal range and within 180 days    Potassium  Date Value Ref Range Status  12/24/2021 3.6 3.5 - 5.2 mmol/L Final         Failed - Na in normal range and within 180 days    Sodium  Date Value Ref Range Status  12/24/2021 138 134 - 144 mmol/L Final         Failed - Cr in normal range and within 180 days    Creatinine, Ser  Date Value Ref Range Status  12/24/2021 1.03 (H) 0.57 - 1.00 mg/dL Final         Failed - eGFR is 10 or above and within 180 days    GFR calc Af Amer  Date Value Ref Range Status  02/01/2020 69 >59 mL/min/1.73 Final    Comment:    **In accordance with recommendations from the NKF-ASN Task force,**   [facility] is in the process of updating its eGFR calculation to the   2021 CKD-EPI creatinine  equation that estimates kidney function   without a race variable.    GFR, Estimated  Date Value Ref Range Status  11/06/2021 >60 >60 mL/min Final    Comment:    (NOTE) Calculated using the CKD-EPI Creatinine Equation (2021)    eGFR  Date Value Ref Range Status  12/24/2021 57 (L) >59 mL/min/1.73 Final         Passed - Patient is not pregnant      Passed - Last BP in normal range    BP Readings from Last 1 Encounters:  07/02/22 120/74         Passed - Valid encounter within last 6 months    Recent Outpatient Visits           3 months ago Acute pain of left thigh   [place] [facility]   8 months ago Shortness of breath   [place] [facility]   9 months ago RSV (acute bronchiolitis due to respiratory syncytial virus)   [place] [facility]   9 months ago RSV (acute bronchiolitis due to respiratory syncytial virus)     [facility] at [facility], Ocie Bob, MD   10 months ago Acute pansinusitis, recurrence not specified   [facility] at [facility], Ocie Bob, MD

## 2022-10-16 NOTE — Telephone Encounter (Signed)
Requested Prescriptions  Pending Prescriptions Disp Refills   atorvastatin (LIPITOR) 10 MG tablet [Pharmacy Med Name: Atorvastatin Calcium 10 MG Oral Tablet] 90 tablet 0    Sig: Take 1 tablet by mouth once daily     Cardiovascular:  Antilipid - Statins Failed - 10/16/2022  6:50 AM      Failed - Lipid Panel in normal range within the last 12 months    Cholesterol, Total  Date Value Ref Range Status  08/01/2021 239 (H) 100 - 199 mg/dL Final   LDL Chol Calc (NIH)  Date Value Ref Range Status  08/01/2021 158 (H) 0 - 99 mg/dL Final   HDL  Date Value Ref Range Status  08/01/2021 40 >39 mg/dL Final   Triglycerides  Date Value Ref Range Status  08/01/2021 221 (H) 0 - 149 mg/dL Final         Passed - Patient is not pregnant      Passed - Valid encounter within last 12 months    Recent Outpatient Visits           3 months ago Acute pain of left thigh   [place] [facility]   8 months ago Shortness of breath   [place] [facility]   9 months ago RSV (acute bronchiolitis due to respiratory syncytial virus)   [place] [facility]   9 months ago RSV (acute bronchiolitis due to respiratory syncytial virus)   [place] [facility], Ocie Bob, MD   10 months ago Acute pansinusitis, recurrence not specified   [place] [facility], Ocie Bob, MD               losartan-hydrochlorothiazide (HYZAAR) 50-12.5 MG tablet [Pharmacy Med Name: Losartan Potassium-HCTZ 50-12.5 MG Oral Tablet] 90 tablet 0    Sig: Take 1 tablet by mouth once daily     Cardiovascular: ARB + Diuretic Combos Failed - 10/16/2022  6:50 AM      Failed - K in normal range and within 180 days    Potassium  Date Value Ref Range Status   12/24/2021 3.6 3.5 - 5.2 mmol/L Final         Failed - Na in normal range and within 180 days    Sodium  Date Value Ref Range Status  12/24/2021 138 134 - 144 mmol/L Final         Failed - Cr in normal range and within 180 days    Creatinine, Ser  Date Value Ref Range Status  12/24/2021 1.03 (H) 0.57 - 1.00 mg/dL Final         Failed - eGFR is 10 or above and within 180 days    GFR calc Af Amer  Date Value Ref Range Status  02/01/2020 69 >59 mL/min/1.73 Final    Comment:    **In accordance with recommendations from the NKF-ASN Task force,**   [facility] is in the process of updating its eGFR calculation to the   2021 CKD-EPI creatinine equation that estimates kidney function   without a race variable.    GFR, Estimated  Date Value Ref Range Status  11/06/2021 >60 >60 mL/min Final    Comment:    (NOTE) Calculated using the CKD-EPI Creatinine Equation (2021)  eGFR  Date Value Ref Range Status  12/24/2021 57 (L) >59 mL/min/1.73 Final         Passed - Patient is not pregnant      Passed - Last BP in normal range    BP Readings from Last 1 Encounters:  07/02/22 120/74         Passed - Valid encounter within last 6 months    Recent Outpatient Visits           3 months ago Acute pain of left thigh   [place] [facility]   8 months ago Shortness of breath   [place] [facility]   9 months ago RSV (acute bronchiolitis due to respiratory syncytial virus)   [place] [facility]   9 months ago RSV (acute bronchiolitis due to respiratory syncytial virus)    [facility], Ocie Bob, MD   10 months ago Acute pansinusitis, recurrence not specified   [facility] at [facility], Ocie Bob, MD

## 2022-10-17 ENCOUNTER — Other Ambulatory Visit: Payer: Self-pay

## 2022-11-04 ENCOUNTER — Encounter: Payer: Self-pay | Admitting: Family Medicine

## 2022-11-13 DIAGNOSIS — D235 Other benign neoplasm of skin of trunk: Secondary | ICD-10-CM | POA: Diagnosis not present

## 2022-12-13 ENCOUNTER — Other Ambulatory Visit: Payer: Self-pay | Admitting: Family Medicine

## 2022-12-13 DIAGNOSIS — M5412 Radiculopathy, cervical region: Secondary | ICD-10-CM | POA: Diagnosis not present

## 2022-12-13 DIAGNOSIS — M4802 Spinal stenosis, cervical region: Secondary | ICD-10-CM | POA: Diagnosis not present

## 2023-01-01 ENCOUNTER — Inpatient Hospital Stay: Admission: RE | Admit: 2023-01-01 | Payer: Medicare HMO | Source: Ambulatory Visit

## 2023-01-13 ENCOUNTER — Other Ambulatory Visit: Payer: Self-pay | Admitting: Family Medicine

## 2023-01-13 DIAGNOSIS — E782 Mixed hyperlipidemia: Secondary | ICD-10-CM

## 2023-01-13 DIAGNOSIS — J301 Allergic rhinitis due to pollen: Secondary | ICD-10-CM | POA: Diagnosis not present

## 2023-01-13 DIAGNOSIS — I10 Essential (primary) hypertension: Secondary | ICD-10-CM

## 2023-01-27 ENCOUNTER — Ambulatory Visit: Payer: Medicare HMO | Admitting: Family Medicine

## 2023-01-27 ENCOUNTER — Ambulatory Visit (INDEPENDENT_AMBULATORY_CARE_PROVIDER_SITE_OTHER): Payer: Medicare HMO | Admitting: Family Medicine

## 2023-01-27 VITALS — BP 126/60 | HR 83 | Ht 61.0 in | Wt 155.2 lb

## 2023-01-27 DIAGNOSIS — Z23 Encounter for immunization: Secondary | ICD-10-CM | POA: Diagnosis not present

## 2023-01-27 DIAGNOSIS — M7062 Trochanteric bursitis, left hip: Secondary | ICD-10-CM | POA: Diagnosis not present

## 2023-01-27 DIAGNOSIS — M47816 Spondylosis without myelopathy or radiculopathy, lumbar region: Secondary | ICD-10-CM | POA: Diagnosis not present

## 2023-01-27 MED ORDER — MELOXICAM 15 MG PO TABS: 15.0000 mg | ORAL_TABLET | Freq: Every day | ORAL | 0 refills | Status: DC

## 2023-01-27 NOTE — Progress Notes (Signed)
 Date:  01/27/2023   Name:  Emily Boyer   DOB:  11/05/1945   MRN:  [id number]   Chief Complaint: Hip Pain (Lt hip with sciatica. Patient did several weeks of PT and it did not help. Hurts worse at night when turning over in the bed, and walking throughout the day. )  Hip Pain  The incident occurred more than 1 week ago. There was no injury mechanism. The pain is present in the left hip (greater trochanteric bursa). The pain is at a severity of 8/10. The pain is moderate. The pain has been Constant since onset. Associated symptoms include a loss of motion. Pertinent negatives include no inability to bear weight, loss of sensation, muscle weakness, numbness or tingling. The symptoms are aggravated by palpation and movement. She has tried acetaminophen  for the symptoms. The treatment provided moderate relief.  Back Pain This is a chronic problem. The current episode started in the past 7 days. The problem occurs intermittently. The problem has been waxing and waning since onset. The pain is present in the lumbar spine. The quality of the pain is described as aching. Pertinent negatives include no abdominal pain, chest pain, dysuria, fever, headaches, numbness or tingling.    Lab Results  Component Value Date   NA 138 12/24/2021   K 3.6 12/24/2021   CO2 24 12/24/2021   GLUCOSE 98 12/24/2021   BUN 16 12/24/2021   CREATININE 1.03 (H) 12/24/2021   CALCIUM  9.5 12/24/2021   EGFR 57 (L) 12/24/2021   GFRNONAA >60 11/06/2021   Lab Results  Component Value Date   CHOL 239 (H) 08/01/2021   HDL 40 08/01/2021   LDLCALC 158 (H) 08/01/2021   TRIG 221 (H) 08/01/2021   CHOLHDL 5.3 (H) 12/04/2016   Lab Results  Component Value Date   TSH 1.240 08/20/2019   No results found for: HGBA1C Lab Results  Component Value Date   WBC 10.3 11/06/2021   HGB 11.9 (L) 11/06/2021   HCT 35.8 (L) 11/06/2021   MCV 87.5 11/06/2021   PLT 514 (H) 11/06/2021   Lab Results  Component Value Date   ALT 31  10/29/2021   AST 40 10/29/2021   ALKPHOS 98 10/29/2021   BILITOT 0.9 10/29/2021   No results found for: 25OHVITD2, 25OHVITD3, VD25OH   Review of Systems  Constitutional:  Negative for chills and fever.  HENT:  Negative for drooling, ear discharge, ear pain and sore throat.   Respiratory:  Negative for cough, shortness of breath and wheezing.   Cardiovascular:  Negative for chest pain, palpitations and leg swelling.  Gastrointestinal:  Negative for abdominal pain, blood in stool, constipation, diarrhea and nausea.  Endocrine: Negative for polydipsia.  Genitourinary:  Negative for dysuria, frequency, hematuria and urgency.  Musculoskeletal:  Positive for back pain. Negative for myalgias and neck pain.  Skin:  Negative for rash.  Allergic/Immunologic: Negative for environmental allergies.  Neurological:  Negative for dizziness, tingling, numbness and headaches.  Hematological:  Does not bruise/bleed easily.  Psychiatric/Behavioral:  Negative for suicidal ideas. The patient is not nervous/anxious.     Patient Active Problem List   Diagnosis Date Noted   RSV (acute bronchiolitis due to respiratory syncytial virus) 12/26/2021   Acute pansinusitis 12/19/2021   Sepsis due to pneumonia (HCC) 10/30/2021   CAP (community acquired pneumonia) 10/30/2021   H/O total hysterectomy 03/03/2020   DDD (degenerative disc disease), cervical 05/28/2018   Sebaceous cyst 12/20/2015   Abnormal findings-gastrointestinal tract    Encounter  for general adult medical examination without abnormal findings 07/04/2014   Nerve root pain 07/04/2014   Gastroesophageal reflux disease 07/04/2014   HLD (hyperlipidemia) 07/04/2014   Hypertension 07/04/2014    Allergies  Allergen Reactions   Penicillins Itching and Hives   Guaifenesin  & Derivatives    Methocarbamol Itching   Tramadol Hcl Itching    Past Surgical History:  Procedure Laterality Date   BREAST BIOPSY Right 2004   neg   CATARACT  EXTRACTION W/PHACO Right 04/29/2017   Procedure: CATARACT EXTRACTION PHACO AND INTRAOCULAR LENS PLACEMENT (IOC) RIGHT;  Surgeon: Myrna Adine Anes, MD;  Location: [facility];  Service: Ophthalmology;  Laterality: Right;   CATARACT EXTRACTION W/PHACO Left 07/15/2017   Procedure: CATARACT EXTRACTION PHACO AND INTRAOCULAR LENS PLACEMENT (IOC) LEFT;  Surgeon: Myrna Adine Anes, MD;  Location: [facility];  Service: Ophthalmology;  Laterality: Left;   COLONOSCOPY WITH PROPOFOL  N/A 02/06/2015   Procedure: COLONOSCOPY WITH PROPOFOL ;  Surgeon: Rogelia Copping, MD;  Location: [facility];  Service: Endoscopy;  Laterality: N/A;   EYE SURGERY  2019   TUBAL LIGATION  1980s   VAGINAL HYSTERECTOMY  1990s   one ovary removed as well    Social History   Tobacco Use   Smoking status: Former    Current packs/day: 0.00    Average packs/day: 0.5 packs/day for 50.0 years (25.0 ttl pk-yrs)    Types: Cigarettes, E-cigarettes    Start date: 11/21/1961    Quit date: 11/22/2011    Years since quitting: 11.1   Smokeless tobacco: Never   Tobacco comments:    quit 2014  Vaping Use   Vaping status: Never Used  Substance Use Topics   Alcohol use: Yes    Alcohol/week: 2.0 standard drinks of alcohol    Types: 2 Glasses of wine per week   Drug use: No     Medication list has been reviewed and updated.  Current Meds  Medication Sig   acetaminophen  (TYLENOL ) 500 MG tablet Take 500 mg by mouth every 6 (six) hours as needed.   albuterol  (VENTOLIN  HFA) 108 (90 Base) MCG/ACT inhaler Inhale 2 puffs into the lungs every 6 (six) hours as needed for wheezing.   aspirin  EC 81 MG tablet Take by mouth.   atorvastatin  (LIPITOR) 10 MG tablet Take 1 tablet by mouth once daily   budesonide -formoterol  (SYMBICORT ) 160-4.5 MCG/ACT inhaler Inhale 2 puffs into the lungs 2 (two) times daily.   cholecalciferol (VITAMIN D) 1000 units tablet Take 1,000 Units by mouth daily.   losartan -hydrochlorothiazide  (HYZAAR)  50-12.5 MG tablet Take 1 tablet by mouth once daily   pantoprazole  (PROTONIX ) 40 MG tablet Take 1 tablet (40 mg total) by mouth daily.   tiZANidine  (ZANAFLEX ) 2 MG tablet Take 2 mg by mouth at bedtime as needed.   Zinc Oxide-Vitamin C (ZINC PLUS VITAMIN C PO) Take 1 tablet by mouth daily.       01/27/2023    9:14 AM 07/02/2022   10:19 AM 01/29/2022    9:14 AM 12/28/2021   11:07 AM  GAD 7 : Generalized Anxiety Score  Nervous, Anxious, on Edge 0 0 0 0  Control/stop worrying 0 0 0 0  Worry too much - different things 0 0 0 0  Trouble relaxing 3 0 0 0  Restless 0 0 0 0  Easily annoyed or irritable 0 0 0 0  Afraid - awful might happen 0 0 0 0  Total GAD 7 Score 3 0 0 0  Anxiety  Difficulty Not difficult at all Not difficult at all Not difficult at all Not difficult at all       01/27/2023    9:14 AM 07/17/2022    9:02 AM 07/17/2022    8:58 AM  Depression screen PHQ 2/9  Decreased Interest 3 0 0  Down, Depressed, Hopeless 0 0 0  PHQ - 2 Score 3 0 0  Altered sleeping 3 0 0  Tired, decreased energy 2 0 0  Change in appetite 0 0 0  Feeling bad or failure about yourself  0 0 0  Trouble concentrating 0 0 0  Moving slowly or fidgety/restless 0 0 0  Suicidal thoughts 0 0 0  PHQ-9 Score 8 0 0  Difficult doing work/chores Not difficult at all Not difficult at all Not difficult at all    BP Readings from Last 3 Encounters:  01/27/23 126/60  07/02/22 120/74  03/26/22 (!) 159/82    Physical Exam Vitals and nursing note reviewed. Exam conducted with a chaperone present.  Constitutional:      General: She is not in acute distress.    Appearance: She is not diaphoretic.  HENT:     Head: Normocephalic and atraumatic.     Right Ear: Tympanic membrane and external ear normal.     Left Ear: Tympanic membrane and external ear normal.     Nose: Nose normal.     Mouth/Throat:     Mouth: Mucous membranes are moist.  Eyes:     General:        Right eye: No discharge.        Left eye: No  discharge.     Conjunctiva/sclera: Conjunctivae normal.     Pupils: Pupils are equal, round, and reactive to light.  Neck:     Thyroid : No thyromegaly.     Vascular: No JVD.  Cardiovascular:     Rate and Rhythm: Normal rate and regular rhythm.     Heart sounds: Normal heart sounds. No murmur heard.    No friction rub. No gallop.  Pulmonary:     Effort: Pulmonary effort is normal.     Breath sounds: Normal breath sounds. No wheezing, rhonchi or rales.  Abdominal:     General: Bowel sounds are normal.     Palpations: Abdomen is soft. There is no mass.     Tenderness: There is no abdominal tenderness. There is no guarding or rebound.  Musculoskeletal:        General: Normal range of motion.     Cervical back: Normal range of motion and neck supple.     Left hip: Tenderness present.     Comments: Tender greater trochanteric bursa  Lymphadenopathy:     Cervical: No cervical adenopathy.  Skin:    General: Skin is warm and dry.  Neurological:     Mental Status: She is alert.     Deep Tendon Reflexes: Reflexes are normal and symmetric.     Wt Readings from Last 3 Encounters:  01/27/23 155 lb 3.2 oz (70.4 kg)  07/17/22 150 lb (68 kg)  07/02/22 150 lb (68 kg)    BP 126/60   Pulse 83   Ht 5' 1 (1.549 m)   Wt 155 lb 3.2 oz (70.4 kg)   SpO2 100%   BMI 29.32 kg/m   Assessment and Plan:  1. Lumbar spondylosis (Primary) Chronic.  Persistent.  Pain continues in the proximal left leg extending to the knee and review of x-ray notes spondylosis.  Patient has undergone physical therapy without relief.  She is currently only taking Tylenol  and we will resume her meloxicam  15 mg once a day however we will refer her back to [facility] Monday for reevaluation and that he was considering MRI at 1 time especially if she had no relief with physical therapy regimen. - meloxicam  (MOBIC ) 15 MG tablet; Take 1 tablet (15 mg total) by mouth daily.  Dispense: 30 tablet; Refill: 0 - Ambulatory referral to  Orthopedic Surgery  2. Trochanteric bursitis of left hip Point tenderness over the trochanteric bursa with pain and movement.  I suspect there is a degree of may be a trochanteric bursitis or iliotibial concern.  We will resume the meloxicam  but refer back to orthopedic surgery for consideration of possible injection or other means of relief. - meloxicam  (MOBIC ) 15 MG tablet; Take 1 tablet (15 mg total) by mouth daily.  Dispense: 30 tablet; Refill: 0 - Ambulatory referral to Orthopedic Surgery  3. Need for influenza vaccination Discussed and administered - Flu Vaccine Trivalent High Dose (Fluad)    Cathryne Molt, MD

## 2023-01-30 ENCOUNTER — Other Ambulatory Visit: Payer: Self-pay | Admitting: Orthopedic Surgery

## 2023-01-30 DIAGNOSIS — M5442 Lumbago with sciatica, left side: Secondary | ICD-10-CM | POA: Diagnosis not present

## 2023-01-30 DIAGNOSIS — M4807 Spinal stenosis, lumbosacral region: Secondary | ICD-10-CM

## 2023-01-30 DIAGNOSIS — M47816 Spondylosis without myelopathy or radiculopathy, lumbar region: Secondary | ICD-10-CM | POA: Diagnosis not present

## 2023-01-30 DIAGNOSIS — G8929 Other chronic pain: Secondary | ICD-10-CM | POA: Diagnosis not present

## 2023-01-30 DIAGNOSIS — M7062 Trochanteric bursitis, left hip: Secondary | ICD-10-CM | POA: Diagnosis not present

## 2023-01-31 ENCOUNTER — Encounter: Payer: Self-pay | Admitting: Orthopedic Surgery

## 2023-02-03 ENCOUNTER — Other Ambulatory Visit: Payer: Medicare HMO

## 2023-02-06 ENCOUNTER — Encounter: Payer: Self-pay | Admitting: Orthopedic Surgery

## 2023-02-08 ENCOUNTER — Other Ambulatory Visit: Payer: Medicare HMO

## 2023-02-08 DIAGNOSIS — Z008 Encounter for other general examination: Secondary | ICD-10-CM | POA: Diagnosis not present

## 2023-02-10 ENCOUNTER — Telehealth: Payer: Self-pay | Admitting: Family Medicine

## 2023-02-10 NOTE — Telephone Encounter (Signed)
Copied from CRM [id number]. Topic: General - Other >> Feb 10, 2023 12:37 PM [place] B wrote: Reason for CRM: pt called in says had a medicare well visit Saturday, Jan 18, and was told she needs a heart monitor called [employer]. She wants to know who authorized or says she needs to have this. Please cb

## 2023-02-10 NOTE — Telephone Encounter (Signed)
 Please review. Thanks JM

## 2023-02-11 NOTE — Telephone Encounter (Signed)
Patient said she is turning in heart monitor today but doesn't understand who ordered this. Told her it was not Dr Yetta Barre and we are unsure. She will call with any further questions or concerns in future.

## 2023-02-18 DIAGNOSIS — G8929 Other chronic pain: Secondary | ICD-10-CM | POA: Diagnosis not present

## 2023-02-18 DIAGNOSIS — M5442 Lumbago with sciatica, left side: Secondary | ICD-10-CM | POA: Diagnosis not present

## 2023-02-18 DIAGNOSIS — M6281 Muscle weakness (generalized): Secondary | ICD-10-CM | POA: Diagnosis not present

## 2023-02-25 ENCOUNTER — Ambulatory Visit: Payer: Self-pay

## 2023-02-25 NOTE — Telephone Encounter (Signed)
 Message from [place] C sent at 02/25/2023 11:22 AM EST  Summary: rsv concern / rx req   The patient shares that they had RSV in November and was seen at [facility]  The patient would like to discuss receiving additional treatment/medications to continue their improvement  Please contact the patient further when possible         Chief Complaint: Is it documented in my chart if I had an RSV vax. Symptoms: none  Disposition: [] ED /[] Urgent Care (no appt availability in office) / [] Appointment(In office/virtual)/ []  [facility]/ [x] Home Care/ [] Refused Recommended Disposition /[] [facility]/ []  Follow-up with PCP Additional Notes: advised pt that she has not had RSV based on chart review.   Reason for Disposition  General information question, no triage required and triager able to answer question  Answer Assessment - Initial Assessment Questions 1. REASON FOR CALL or QUESTION: What is your reason for calling today? or How can I best help you? or What question do you have that I can help answer?     Is it documented in my chart if I had an RSV vax.  Protocols used: Information Only Call - No Triage-A-AH

## 2023-03-04 DIAGNOSIS — M5442 Lumbago with sciatica, left side: Secondary | ICD-10-CM | POA: Diagnosis not present

## 2023-03-04 DIAGNOSIS — M47816 Spondylosis without myelopathy or radiculopathy, lumbar region: Secondary | ICD-10-CM | POA: Diagnosis not present

## 2023-03-04 DIAGNOSIS — G8929 Other chronic pain: Secondary | ICD-10-CM | POA: Diagnosis not present

## 2023-03-04 DIAGNOSIS — M6281 Muscle weakness (generalized): Secondary | ICD-10-CM | POA: Diagnosis not present

## 2023-03-19 DIAGNOSIS — M47816 Spondylosis without myelopathy or radiculopathy, lumbar region: Secondary | ICD-10-CM | POA: Diagnosis not present

## 2023-03-19 DIAGNOSIS — M7062 Trochanteric bursitis, left hip: Secondary | ICD-10-CM | POA: Diagnosis not present

## 2023-03-19 DIAGNOSIS — G8929 Other chronic pain: Secondary | ICD-10-CM | POA: Diagnosis not present

## 2023-03-19 DIAGNOSIS — M6281 Muscle weakness (generalized): Secondary | ICD-10-CM | POA: Diagnosis not present

## 2023-03-19 DIAGNOSIS — M5442 Lumbago with sciatica, left side: Secondary | ICD-10-CM | POA: Diagnosis not present

## 2023-03-24 ENCOUNTER — Other Ambulatory Visit: Payer: Self-pay | Admitting: Orthopedic Surgery

## 2023-03-24 DIAGNOSIS — M7062 Trochanteric bursitis, left hip: Secondary | ICD-10-CM

## 2023-03-24 DIAGNOSIS — M1612 Unilateral primary osteoarthritis, left hip: Secondary | ICD-10-CM

## 2023-03-24 DIAGNOSIS — G8929 Other chronic pain: Secondary | ICD-10-CM | POA: Diagnosis not present

## 2023-03-24 DIAGNOSIS — M25552 Pain in left hip: Secondary | ICD-10-CM | POA: Diagnosis not present

## 2023-04-14 ENCOUNTER — Other Ambulatory Visit: Payer: Self-pay | Admitting: Family Medicine

## 2023-04-14 DIAGNOSIS — K219 Gastro-esophageal reflux disease without esophagitis: Secondary | ICD-10-CM

## 2023-04-15 NOTE — Telephone Encounter (Signed)
 90 day courtesy refill- Sent pt message on MyChart to make appt for f/u- last OV that addressed issue Jan. 2025  Requested Prescriptions  Pending Prescriptions Disp Refills   pantoprazole (PROTONIX) 40 MG tablet [Pharmacy Med Name: Pantoprazole Sodium 40 MG Oral Tablet Delayed Release] 90 tablet 0    Sig: Take 1 tablet by mouth once daily     Gastroenterology: Proton Pump Inhibitors Passed - 04/15/2023  2:02 PM      Passed - Valid encounter within last 12 months    Recent Outpatient Visits           2 months ago Lumbar spondylosis   [place] [facility]   9 months ago Acute pain of left thigh   [place] [facility]   1 year ago Shortness of breath   [place] [facility]   1 year ago RSV (acute bronchiolitis due to respiratory syncytial virus)   [place] [facility]   1 year ago RSV (acute bronchiolitis due to respiratory syncytial virus)    [facility] at [facility], Ocie Bob, MD

## 2023-04-18 ENCOUNTER — Ambulatory Visit
Admission: RE | Admit: 2023-04-18 | Discharge: 2023-04-18 | Disposition: A | Discharge status: Home / Self Care | Source: Ambulatory Visit | Attending: Orthopedic Surgery | Admitting: Orthopedic Surgery

## 2023-04-18 DIAGNOSIS — M1612 Unilateral primary osteoarthritis, left hip: Secondary | ICD-10-CM

## 2023-04-18 DIAGNOSIS — M25552 Pain in left hip: Secondary | ICD-10-CM | POA: Diagnosis not present

## 2023-04-18 DIAGNOSIS — G8929 Other chronic pain: Secondary | ICD-10-CM

## 2023-04-18 DIAGNOSIS — M7062 Trochanteric bursitis, left hip: Secondary | ICD-10-CM

## 2023-05-05 NOTE — Discharge Instructions (Signed)

## 2023-05-06 ENCOUNTER — Other Ambulatory Visit

## 2023-05-06 ENCOUNTER — Ambulatory Visit
Admission: RE | Admit: 2023-05-06 | Discharge: 2023-05-06 | Disposition: A | Discharge status: Home / Self Care | Source: Ambulatory Visit | Attending: Family Medicine | Admitting: Family Medicine

## 2023-05-06 DIAGNOSIS — M4722 Other spondylosis with radiculopathy, cervical region: Secondary | ICD-10-CM | POA: Diagnosis not present

## 2023-05-06 DIAGNOSIS — M5412 Radiculopathy, cervical region: Secondary | ICD-10-CM

## 2023-05-06 MED ORDER — LIDOCAINE 1 % OPTIME INJ - NO CHARGE
5.0000 mL | Freq: Once | INTRAMUSCULAR | Status: AC
  Administered 2023-05-06: 5 mL via INTRADERMAL

## 2023-05-06 MED ORDER — TRIAMCINOLONE ACETONIDE 40 MG/ML IJ SUSP (RADIOLOGY)
60.0000 mg | Freq: Once | INTRAMUSCULAR | Status: AC
  Administered 2023-05-06: 60 mg via EPIDURAL

## 2023-05-06 MED ORDER — IOPAMIDOL (ISOVUE-M 300) INJECTION 61%
3.0000 mL | Freq: Once | INTRAMUSCULAR | Status: AC | PRN
  Administered 2023-05-06: 3 mL via EPIDURAL

## 2023-05-12 DIAGNOSIS — S76012A Strain of muscle, fascia and tendon of left hip, initial encounter: Secondary | ICD-10-CM | POA: Diagnosis not present

## 2023-05-14 ENCOUNTER — Other Ambulatory Visit: Payer: Self-pay | Admitting: Family Medicine

## 2023-05-14 DIAGNOSIS — I10 Essential (primary) hypertension: Secondary | ICD-10-CM

## 2023-05-14 DIAGNOSIS — E782 Mixed hyperlipidemia: Secondary | ICD-10-CM

## 2023-05-27 DIAGNOSIS — M4802 Spinal stenosis, cervical region: Secondary | ICD-10-CM | POA: Diagnosis not present

## 2023-05-27 DIAGNOSIS — M5412 Radiculopathy, cervical region: Secondary | ICD-10-CM | POA: Diagnosis not present

## 2023-10-15 DIAGNOSIS — L729 Follicular cyst of the skin and subcutaneous tissue, unspecified: Secondary | ICD-10-CM | POA: Diagnosis not present

## 2023-11-12 ENCOUNTER — Other Ambulatory Visit: Payer: Self-pay | Admitting: Internal Medicine

## 2023-11-12 DIAGNOSIS — Z1231 Encounter for screening mammogram for malignant neoplasm of breast: Secondary | ICD-10-CM

## 2023-12-10 HISTORY — PX: CYST REMOVAL TRUNK: SHX6283

## 2023-12-13 ENCOUNTER — Ambulatory Visit
Admission: EM | Admit: 2023-12-13 | Discharge: 2023-12-13 | Disposition: A | Discharge status: Home / Self Care | Attending: Emergency Medicine | Admitting: Emergency Medicine

## 2023-12-13 ENCOUNTER — Encounter: Payer: Self-pay | Admitting: Emergency Medicine

## 2023-12-13 DIAGNOSIS — J069 Acute upper respiratory infection, unspecified: Secondary | ICD-10-CM

## 2023-12-13 DIAGNOSIS — R053 Chronic cough: Secondary | ICD-10-CM

## 2023-12-13 DIAGNOSIS — J210 Acute bronchiolitis due to respiratory syncytial virus: Secondary | ICD-10-CM

## 2023-12-13 DIAGNOSIS — R051 Acute cough: Secondary | ICD-10-CM

## 2023-12-13 DIAGNOSIS — R0602 Shortness of breath: Secondary | ICD-10-CM

## 2023-12-13 DIAGNOSIS — Z8619 Personal history of other infectious and parasitic diseases: Secondary | ICD-10-CM

## 2023-12-13 LAB — POC COVID19/FLU A&B COMBO
Covid Antigen, POC: NEGATIVE
Influenza A Antigen, POC: NEGATIVE
Influenza B Antigen, POC: NEGATIVE

## 2023-12-13 MED ORDER — ALBUTEROL SULFATE HFA 108 (90 BASE) MCG/ACT IN AERS: 2.0000 | INHALATION_SPRAY | Freq: Four times a day (QID) | RESPIRATORY_TRACT | 11 refills | Status: AC | PRN

## 2023-12-13 MED ORDER — PROMETHAZINE-DM 6.25-15 MG/5ML PO SYRP: 5.0000 mL | ORAL_SOLUTION | Freq: Four times a day (QID) | ORAL | 0 refills | Status: AC | PRN

## 2023-12-13 MED ORDER — AEROCHAMBER MV MISC: 2 refills | Status: AC

## 2023-12-13 MED ORDER — BENZONATATE 100 MG PO CAPS: 200.0000 mg | ORAL_CAPSULE | Freq: Three times a day (TID) | ORAL | 0 refills | Status: AC

## 2023-12-13 MED ORDER — IPRATROPIUM BROMIDE 0.06 % NA SOLN: 2.0000 | Freq: Four times a day (QID) | NASAL | 12 refills | Status: AC

## 2023-12-13 NOTE — ED Triage Notes (Signed)
 Pt c/o cough x1day  Pt states that she had taken OTC cough syrup and it did not help  Pt states that she has chest, shoulder, and abdominal soreness from coughing.   Pt had a cyst removal done on 11.19.25 at [facility]

## 2023-12-13 NOTE — Discharge Instructions (Addendum)
 Your testing today was negative for COVID or influenza.  However, based upon your exam, I feel that you have a viral respiratory illness causing your symptoms.  Use the albuterol  inhaler, with the spacer, and take 1 to 2 puffs every 4-6 hours as needed for shortness breath or wheezing.  You may use over-the-counter Tylenol  according to the package instructions as needed for any pain or fever if you develop.  Use the Atrovent  nasal spray, 2 squirts in each nostril every 6 hours, as needed for runny nose and postnasal drip.  Use the Tessalon  Perles every 8 hours during the day.  Take them with a small sip of water .  They may give you some numbness to the base of your tongue or a metallic taste in your mouth, this is normal.  Use the Promethazine  DM cough syrup at bedtime for cough and congestion.  It will make you drowsy so do not take it during the day.  Return for reevaluation or see your primary care provider for any new or worsening symptoms.

## 2023-12-13 NOTE — ED Provider Notes (Signed)
 [facility]    CSN: [id number] Arrival date & time: 12/13/23  0902      History   Chief Complaint Chief Complaint  Patient presents with   Cough    HPI Emily Boyer is a 78 y.o. female.   HPI  78 year old female with past medical history significant for hypertension, hyperlipidemia, and GERD presents for evaluation of a productive cough for yellow sputum with shortness breath and wheezing that began yesterday.  This is associated with runny nose and nasal congestion but no fever, ear pain, or sore throat.  She did have a cyst removed at [facility] and one of the physicians offices 3 days ago.  Past Medical History:  Diagnosis Date   Allergy Sinus   Bone spur    right side of neck   Colitis    DDD (degenerative disc disease), cervical    Diverticulitis    GERD (gastroesophageal reflux disease)    Heart murmur    followed by PCP   HLD (hyperlipidemia)    Hypertension    Seasonal allergies     Patient Active Problem List   Diagnosis Date Noted   RSV (acute bronchiolitis due to respiratory syncytial virus) 12/26/2021   Acute pansinusitis 12/19/2021   Sepsis due to pneumonia (HCC) 10/30/2021   CAP (community acquired pneumonia) 10/30/2021   H/O total hysterectomy 03/03/2020   DDD (degenerative disc disease), cervical 05/28/2018   Sebaceous cyst 12/20/2015   Abnormal findings-gastrointestinal tract    Encounter for general adult medical examination without abnormal findings 07/04/2014   Nerve root pain 07/04/2014   Gastroesophageal reflux disease 07/04/2014   HLD (hyperlipidemia) 07/04/2014   Hypertension 07/04/2014    Past Surgical History:  Procedure Laterality Date   BREAST BIOPSY Right 2004   neg   CATARACT EXTRACTION W/PHACO Right 04/29/2017   Procedure: CATARACT EXTRACTION PHACO AND INTRAOCULAR LENS PLACEMENT (IOC) RIGHT;  Surgeon: Myrna Adine Anes, MD;  Location: [facility];  Service: Ophthalmology;  Laterality: Right;   CATARACT  EXTRACTION W/PHACO Left 07/15/2017   Procedure: CATARACT EXTRACTION PHACO AND INTRAOCULAR LENS PLACEMENT (IOC) LEFT;  Surgeon: Myrna Adine Anes, MD;  Location: [facility];  Service: Ophthalmology;  Laterality: Left;   COLONOSCOPY WITH PROPOFOL  N/A 02/06/2015   Procedure: COLONOSCOPY WITH PROPOFOL ;  Surgeon: Rogelia Copping, MD;  Location: [facility];  Service: Endoscopy;  Laterality: N/A;   CYST REMOVAL TRUNK  12/10/2023   From back at [facility]   EYE SURGERY  2019   TUBAL LIGATION  1980s   VAGINAL HYSTERECTOMY  1990s   one ovary removed as well    OB History   No obstetric history on file.      Home Medications    Prior to Admission medications   Medication Sig Start Date End Date Taking? Authorizing Provider  aspirin  EC 81 MG tablet Take by mouth.   Yes [provider]  atorvastatin  (LIPITOR) 10 MG tablet TAKE 1 TABLET BY MOUTH ONCE DAILY *APPOINTMENT NEEDED FOR BLOOD PRESSURE CHECK AND LABS* 05/15/23  Yes Joshua Cathryne BROCKS, MD  benzonatate  (TESSALON ) 100 MG capsule Take 2 capsules (200 mg total) by mouth every 8 (eight) hours. 12/13/23  Yes Bernardino Ditch, NP  cholecalciferol (VITAMIN D) 1000 units tablet Take 1,000 Units by mouth daily.   Yes [provider]  ipratropium (ATROVENT ) 0.06 % nasal spray Place 2 sprays into both nostrils 4 (four) times daily. 12/13/23  Yes Bernardino Ditch, NP  losartan -hydrochlorothiazide  (HYZAAR) 50-12.5 MG tablet TAKE 1 TABLET  BY MOUTH ONCE DAILY *APPOINTMENT NEEDED FOR BLOOD PRESSURE CHECK AND LABS* 05/15/23  Yes Joshua Cathryne BROCKS, MD  pantoprazole  (PROTONIX ) 40 MG tablet Take 1 tablet by mouth once daily 04/15/23  Yes Jones, Deanna C, MD  promethazine -dextromethorphan (PROMETHAZINE -DM) 6.25-15 MG/5ML syrup Take 5 mLs by mouth 4 (four) times daily as needed. 12/13/23  Yes Bernardino Ditch, NP  Spacer/Aero-Holding Chambers (AEROCHAMBER MV) inhaler Use as instructed 12/13/23  Yes Bernardino Ditch, NP  Zinc Oxide-Vitamin C (ZINC PLUS VITAMIN C  PO) Take 1 tablet by mouth daily.   Yes [provider]  albuterol  (VENTOLIN  HFA) 108 (90 Base) MCG/ACT inhaler Inhale 2 puffs into the lungs every 6 (six) hours as needed for wheezing. 12/13/23   Bernardino Ditch, NP    Family History Family History  Problem Relation Age of Onset   Stroke Mother    Hypertension Mother    Arthritis Father    Heart disease Paternal Aunt    Cancer Paternal Grandmother    Stomach cancer Paternal Grandmother    Breast cancer Cousin        pat cousin    Social History Social History   Tobacco Use   Smoking status: Former    Current packs/day: 0.00    Average packs/day: 0.5 packs/day for 50.0 years (25.0 ttl pk-yrs)    Types: Cigarettes, E-cigarettes    Start date: 11/21/1961    Quit date: 11/22/2011    Years since quitting: 12.0   Smokeless tobacco: Never   Tobacco comments:    quit 2014  Vaping Use   Vaping status: Never Used  Substance Use Topics   Alcohol use: Yes    Alcohol/week: 2.0 standard drinks of alcohol    Types: 2 Glasses of wine per week   Drug use: No     Allergies   Penicillins, Guaifenesin  & derivatives, Methocarbamol, and Tramadol hcl   Review of Systems Review of Systems  Constitutional:  Negative for fever.  HENT:  Positive for congestion and rhinorrhea. Negative for ear pain and sore throat.   Respiratory:  Positive for cough, shortness of breath and wheezing.      Physical Exam Triage Vital Signs ED Triage Vitals  Encounter Vitals Group     BP      Girls Systolic BP Percentile      Girls Diastolic BP Percentile      Boys Systolic BP Percentile      Boys Diastolic BP Percentile      Pulse      Resp      Temp      Temp src      SpO2      Weight      Height      Head Circumference      Peak Flow      Pain Score      Pain Loc      Pain Education      Exclude from Growth Chart    No data found.  Updated Vital Signs BP 134/84 (BP Location: Left Arm)   Pulse 94   Temp 98.6 F (37 C) (Oral)    Resp 16   Wt 150 lb 9.6 oz (68.3 kg)   SpO2 93%   BMI 28.46 kg/m   Visual Acuity Right Eye Distance:   Left Eye Distance:   Bilateral Distance:    Right Eye Near:   Left Eye Near:    Bilateral Near:     Physical Exam Vitals and nursing note  reviewed.  Constitutional:      Appearance: Normal appearance. She is not ill-appearing.  HENT:     Head: Normocephalic and atraumatic.     Right Ear: Tympanic membrane, ear canal and external ear normal. There is no impacted cerumen.     Left Ear: Tympanic membrane, ear canal and external ear normal. There is no impacted cerumen.     Nose: Congestion and rhinorrhea present.     Comments: Please mucosa is edematous and erythematous with clear discharge in both nares.    Mouth/Throat:     Mouth: Mucous membranes are moist.     Pharynx: Oropharynx is clear. No oropharyngeal exudate or posterior oropharyngeal erythema.  Cardiovascular:     Rate and Rhythm: Normal rate and regular rhythm.     Pulses: Normal pulses.     Heart sounds: Normal heart sounds. No murmur heard.    No friction rub. No gallop.  Pulmonary:     Effort: Pulmonary effort is normal.     Breath sounds: Normal breath sounds. No wheezing, rhonchi or rales.  Musculoskeletal:     Cervical back: Normal range of motion and neck supple. No tenderness.  Lymphadenopathy:     Cervical: No cervical adenopathy.  Skin:    General: Skin is warm and dry.     Capillary Refill: Capillary refill takes less than 2 seconds.     Findings: No rash.  Neurological:     General: No focal deficit present.     Mental Status: She is alert and oriented to person, place, and time.      UC Treatments / Results  Labs (all labs ordered are listed, but only abnormal results are displayed) Labs Reviewed  POC COVID19/FLU A&B COMBO - Normal    EKG   Radiology No results found.  Procedures Procedures (including critical care time)  Medications Ordered in UC Medications - No data to  display  Initial Impression / Assessment and Plan / UC Course  I have reviewed the triage vital signs and the nursing notes.  Pertinent labs & imaging results that were available during my care of the patient were reviewed by me and considered in my medical decision making (see chart for details).   Patient is a nontoxic-appearing 78 year old female presenting for evaluation of 1 day worth of respiratory symptoms as outlined in the HPI above.  She is reporting shortness of breath and wheezing though she is able to speak in full sentences without dyspnea or tachypnea.  Her lungs are clear to auscultation in all fields.  She was at [facility] and with the physician's offices to have a cyst removed 3 days ago and her symptoms began the next day.  Due to possible exposure to respiratory viruses I will order a COVID and flu antigen test.  Respiratory panel is negative for influenza or COVID.  I will discharge patient on the diagnosis of viral URI with a cough.  I will prescribe Atrovent  nasal spray for her nasal congestion and Tessalon  Perles and Promethazine  DM cough syrup for cough and congestion.  I will also prescribe an albuterol  inhaler with spacer and she can do 1 to 2 puffs every 4-6 hours as needed for shortness breath or wheezing.   Final Clinical Impressions(s) / UC Diagnoses   Final diagnoses:  Acute cough  Viral URI with cough     Discharge Instructions      Your testing today was negative for COVID or influenza.  However, based upon your exam, I feel  that you have a viral respiratory illness causing your symptoms.  Use the albuterol  inhaler, with the spacer, and take 1 to 2 puffs every 4-6 hours as needed for shortness breath or wheezing.  You may use over-the-counter Tylenol  according to the package instructions as needed for any pain or fever if you develop.  Use the Atrovent  nasal spray, 2 squirts in each nostril every 6 hours, as needed for runny nose and postnasal drip.  Use  the Tessalon  Perles every 8 hours during the day.  Take them with a small sip of water .  They may give you some numbness to the base of your tongue or a metallic taste in your mouth, this is normal.  Use the Promethazine  DM cough syrup at bedtime for cough and congestion.  It will make you drowsy so do not take it during the day.  Return for reevaluation or see your primary care provider for any new or worsening symptoms.      ED Prescriptions     Medication Sig Dispense Auth. Provider   albuterol  (VENTOLIN  HFA) 108 (90 Base) MCG/ACT inhaler Inhale 2 puffs into the lungs every 6 (six) hours as needed for wheezing. 2 each Bernardino Ditch, NP   Spacer/Aero-Holding Chambers (AEROCHAMBER MV) inhaler Use as instructed 1 each Bernardino Ditch, NP   benzonatate  (TESSALON ) 100 MG capsule Take 2 capsules (200 mg total) by mouth every 8 (eight) hours. 21 capsule Bernardino Ditch, NP   ipratropium (ATROVENT ) 0.06 % nasal spray Place 2 sprays into both nostrils 4 (four) times daily. 15 mL Bernardino Ditch, NP   promethazine -dextromethorphan (PROMETHAZINE -DM) 6.25-15 MG/5ML syrup Take 5 mLs by mouth 4 (four) times daily as needed. 118 mL Bernardino Ditch, NP      PDMP not reviewed this encounter.   Bernardino Ditch, NP 12/13/23 1021

## 2024-01-27 ENCOUNTER — Encounter: Payer: Self-pay | Admitting: Orthopedic Surgery

## 2024-02-18 ENCOUNTER — Other Ambulatory Visit: Payer: Self-pay | Admitting: Internal Medicine

## 2024-02-18 DIAGNOSIS — Z1231 Encounter for screening mammogram for malignant neoplasm of breast: Secondary | ICD-10-CM

## 2024-02-27 ENCOUNTER — Other Ambulatory Visit: Payer: Self-pay | Admitting: Family Medicine

## 2024-02-27 DIAGNOSIS — M5412 Radiculopathy, cervical region: Secondary | ICD-10-CM

## 2024-03-11 ENCOUNTER — Other Ambulatory Visit

## 2024-03-17 ENCOUNTER — Ambulatory Visit
